# Patient Record
Sex: Female | Born: 1940 | Race: White | Hispanic: No | Marital: Married | Smoking: Former smoker
Health system: Southern US, Community
[De-identification: ages and names within clinical notes are randomized; demographics above are authoritative.]

## PROBLEM LIST (undated history)

## (undated) DIAGNOSIS — I10 Essential (primary) hypertension: Secondary | ICD-10-CM

## (undated) DIAGNOSIS — E7800 Pure hypercholesterolemia, unspecified: Secondary | ICD-10-CM

## (undated) DIAGNOSIS — IMO0002 Reserved for concepts with insufficient information to code with codable children: Secondary | ICD-10-CM

## (undated) DIAGNOSIS — M8580 Other specified disorders of bone density and structure, unspecified site: Secondary | ICD-10-CM

## (undated) DIAGNOSIS — J302 Other seasonal allergic rhinitis: Secondary | ICD-10-CM

## (undated) DIAGNOSIS — M1990 Unspecified osteoarthritis, unspecified site: Secondary | ICD-10-CM

## (undated) DIAGNOSIS — C4370 Malignant melanoma of unspecified lower limb, including hip: Secondary | ICD-10-CM

## (undated) DIAGNOSIS — C50919 Malignant neoplasm of unspecified site of unspecified female breast: Secondary | ICD-10-CM

## (undated) DIAGNOSIS — N189 Chronic kidney disease, unspecified: Secondary | ICD-10-CM

## (undated) HISTORY — DX: Other specified disorders of bone density and structure, unspecified site: M85.80

## (undated) HISTORY — DX: Chronic kidney disease, unspecified: N18.9

## (undated) HISTORY — PX: COLONOSCOPY: SHX174

## (undated) HISTORY — DX: Unspecified osteoarthritis, unspecified site: M19.90

## (undated) HISTORY — DX: Essential (primary) hypertension: I10

## (undated) HISTORY — DX: Malignant neoplasm of unspecified site of unspecified female breast: C50.919

## (undated) HISTORY — PX: MOHS SURGERY: SUR867

## (undated) HISTORY — PX: JOINT REPLACEMENT: SHX530

---
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
## 1966-01-21 HISTORY — PX: DILATION AND CURETTAGE OF UTERUS: SHX78

## 1997-06-06 ENCOUNTER — Other Ambulatory Visit: Admission: RE | Admit: 1997-06-06 | Discharge: 1997-06-06 | Payer: Self-pay | Admitting: Obstetrics and Gynecology

## 1997-07-12 ENCOUNTER — Ambulatory Visit (HOSPITAL_COMMUNITY): Admission: RE | Admit: 1997-07-12 | Discharge: 1997-07-12 | Payer: Self-pay | Admitting: Obstetrics & Gynecology

## 1998-06-06 ENCOUNTER — Other Ambulatory Visit: Admission: RE | Admit: 1998-06-06 | Discharge: 1998-06-06 | Payer: Self-pay | Admitting: Obstetrics and Gynecology

## 1998-06-08 ENCOUNTER — Ambulatory Visit (HOSPITAL_COMMUNITY): Admission: RE | Admit: 1998-06-08 | Discharge: 1998-06-08 | Payer: Self-pay | Admitting: Obstetrics and Gynecology

## 1998-06-08 ENCOUNTER — Encounter: Payer: Self-pay | Admitting: Obstetrics and Gynecology

## 1998-07-21 ENCOUNTER — Ambulatory Visit (HOSPITAL_COMMUNITY): Admission: RE | Admit: 1998-07-21 | Discharge: 1998-07-21 | Payer: Self-pay | Admitting: Obstetrics and Gynecology

## 1999-06-26 ENCOUNTER — Encounter: Payer: Self-pay | Admitting: Obstetrics and Gynecology

## 1999-06-26 ENCOUNTER — Ambulatory Visit (HOSPITAL_COMMUNITY): Admission: RE | Admit: 1999-06-26 | Discharge: 1999-06-26 | Payer: Self-pay | Admitting: Obstetrics and Gynecology

## 1999-06-26 ENCOUNTER — Other Ambulatory Visit: Admission: RE | Admit: 1999-06-26 | Discharge: 1999-06-26 | Payer: Self-pay | Admitting: Obstetrics and Gynecology

## 2000-08-18 ENCOUNTER — Other Ambulatory Visit: Admission: RE | Admit: 2000-08-18 | Discharge: 2000-08-18 | Payer: Self-pay | Admitting: Obstetrics and Gynecology

## 2000-09-01 ENCOUNTER — Encounter: Payer: Self-pay | Admitting: Obstetrics and Gynecology

## 2000-09-01 ENCOUNTER — Ambulatory Visit (HOSPITAL_COMMUNITY): Admission: RE | Admit: 2000-09-01 | Discharge: 2000-09-01 | Payer: Self-pay | Admitting: Obstetrics and Gynecology

## 2000-10-30 ENCOUNTER — Ambulatory Visit (HOSPITAL_COMMUNITY): Admission: RE | Admit: 2000-10-30 | Discharge: 2000-10-30 | Payer: Self-pay | Admitting: Internal Medicine

## 2001-09-03 ENCOUNTER — Ambulatory Visit (HOSPITAL_COMMUNITY): Admission: RE | Admit: 2001-09-03 | Discharge: 2001-09-03 | Payer: Self-pay | Admitting: Obstetrics and Gynecology

## 2001-09-03 ENCOUNTER — Encounter: Payer: Self-pay | Admitting: Obstetrics and Gynecology

## 2001-09-17 ENCOUNTER — Other Ambulatory Visit: Admission: RE | Admit: 2001-09-17 | Discharge: 2001-09-17 | Payer: Self-pay | Admitting: Obstetrics and Gynecology

## 2002-10-07 ENCOUNTER — Other Ambulatory Visit: Admission: RE | Admit: 2002-10-07 | Discharge: 2002-10-07 | Payer: Self-pay | Admitting: Obstetrics and Gynecology

## 2002-10-11 ENCOUNTER — Encounter: Payer: Self-pay | Admitting: Obstetrics and Gynecology

## 2002-10-11 ENCOUNTER — Ambulatory Visit (HOSPITAL_COMMUNITY): Admission: RE | Admit: 2002-10-11 | Discharge: 2002-10-11 | Payer: Self-pay | Admitting: Obstetrics and Gynecology

## 2003-05-22 DIAGNOSIS — C4370 Malignant melanoma of unspecified lower limb, including hip: Secondary | ICD-10-CM

## 2003-05-22 HISTORY — DX: Malignant melanoma of unspecified lower limb, including hip: C43.70

## 2003-05-22 HISTORY — PX: MELANOMA EXCISION: SHX5266

## 2003-06-03 ENCOUNTER — Ambulatory Visit (HOSPITAL_COMMUNITY): Admission: RE | Admit: 2003-06-03 | Discharge: 2003-06-03 | Payer: Self-pay | Admitting: Hematology and Oncology

## 2003-06-07 ENCOUNTER — Ambulatory Visit (HOSPITAL_COMMUNITY): Admission: RE | Admit: 2003-06-07 | Discharge: 2003-06-07 | Payer: Self-pay | Admitting: General Surgery

## 2003-06-07 ENCOUNTER — Encounter (INDEPENDENT_AMBULATORY_CARE_PROVIDER_SITE_OTHER): Payer: Self-pay | Admitting: *Deleted

## 2003-06-07 ENCOUNTER — Ambulatory Visit (HOSPITAL_BASED_OUTPATIENT_CLINIC_OR_DEPARTMENT_OTHER): Admission: RE | Admit: 2003-06-07 | Discharge: 2003-06-07 | Payer: Self-pay | Admitting: General Surgery

## 2003-10-10 ENCOUNTER — Other Ambulatory Visit: Admission: RE | Admit: 2003-10-10 | Discharge: 2003-10-10 | Payer: Self-pay | Admitting: Obstetrics and Gynecology

## 2003-10-12 ENCOUNTER — Ambulatory Visit (HOSPITAL_COMMUNITY): Admission: RE | Admit: 2003-10-12 | Discharge: 2003-10-12 | Payer: Self-pay | Admitting: Obstetrics and Gynecology

## 2003-11-15 ENCOUNTER — Ambulatory Visit (HOSPITAL_BASED_OUTPATIENT_CLINIC_OR_DEPARTMENT_OTHER): Admission: RE | Admit: 2003-11-15 | Discharge: 2003-11-15 | Payer: Self-pay | Admitting: General Surgery

## 2003-11-15 ENCOUNTER — Ambulatory Visit (HOSPITAL_COMMUNITY): Admission: RE | Admit: 2003-11-15 | Discharge: 2003-11-15 | Payer: Self-pay | Admitting: General Surgery

## 2003-11-15 ENCOUNTER — Encounter (INDEPENDENT_AMBULATORY_CARE_PROVIDER_SITE_OTHER): Payer: Self-pay | Admitting: *Deleted

## 2004-10-11 ENCOUNTER — Other Ambulatory Visit: Admission: RE | Admit: 2004-10-11 | Discharge: 2004-10-11 | Payer: Self-pay | Admitting: Obstetrics and Gynecology

## 2004-10-16 ENCOUNTER — Ambulatory Visit (HOSPITAL_COMMUNITY): Admission: RE | Admit: 2004-10-16 | Discharge: 2004-10-16 | Payer: Self-pay | Admitting: Obstetrics and Gynecology

## 2004-12-06 ENCOUNTER — Encounter: Admission: RE | Admit: 2004-12-06 | Discharge: 2004-12-06 | Payer: Self-pay | Admitting: Gynecology

## 2004-12-20 ENCOUNTER — Encounter: Admission: RE | Admit: 2004-12-20 | Discharge: 2004-12-20 | Payer: Self-pay | Admitting: Dermatology

## 2004-12-21 ENCOUNTER — Ambulatory Visit (HOSPITAL_COMMUNITY): Admission: RE | Admit: 2004-12-21 | Discharge: 2004-12-21 | Payer: Self-pay | Admitting: Dermatology

## 2005-05-21 ENCOUNTER — Encounter: Admission: RE | Admit: 2005-05-21 | Discharge: 2005-05-21 | Payer: Self-pay | Admitting: Dermatology

## 2005-05-23 ENCOUNTER — Encounter: Admission: RE | Admit: 2005-05-23 | Discharge: 2005-05-23 | Payer: Self-pay | Admitting: Dermatology

## 2005-10-15 ENCOUNTER — Other Ambulatory Visit: Admission: RE | Admit: 2005-10-15 | Discharge: 2005-10-15 | Payer: Self-pay | Admitting: Obstetrics & Gynecology

## 2005-10-23 ENCOUNTER — Ambulatory Visit (HOSPITAL_COMMUNITY): Admission: RE | Admit: 2005-10-23 | Discharge: 2005-10-23 | Payer: Self-pay | Admitting: Obstetrics and Gynecology

## 2006-01-21 HISTORY — PX: SQUAMOUS CELL CARCINOMA EXCISION: SHX2433

## 2006-10-29 ENCOUNTER — Other Ambulatory Visit: Admission: RE | Admit: 2006-10-29 | Discharge: 2006-10-29 | Payer: Self-pay | Admitting: Obstetrics and Gynecology

## 2006-10-31 ENCOUNTER — Ambulatory Visit (HOSPITAL_COMMUNITY): Admission: RE | Admit: 2006-10-31 | Discharge: 2006-10-31 | Payer: Self-pay | Admitting: Obstetrics and Gynecology

## 2007-10-29 ENCOUNTER — Other Ambulatory Visit: Admission: RE | Admit: 2007-10-29 | Discharge: 2007-10-29 | Payer: Self-pay | Admitting: Obstetrics and Gynecology

## 2007-11-02 ENCOUNTER — Ambulatory Visit (HOSPITAL_COMMUNITY): Admission: RE | Admit: 2007-11-02 | Discharge: 2007-11-02 | Payer: Self-pay | Admitting: Obstetrics and Gynecology

## 2007-11-13 ENCOUNTER — Encounter: Admission: RE | Admit: 2007-11-13 | Discharge: 2007-11-13 | Payer: Self-pay | Admitting: Internal Medicine

## 2008-09-06 DIAGNOSIS — Z85820 Personal history of malignant melanoma of skin: Secondary | ICD-10-CM | POA: Insufficient documentation

## 2008-09-06 DIAGNOSIS — M479 Spondylosis, unspecified: Secondary | ICD-10-CM | POA: Insufficient documentation

## 2008-09-06 DIAGNOSIS — E559 Vitamin D deficiency, unspecified: Secondary | ICD-10-CM | POA: Insufficient documentation

## 2008-11-03 ENCOUNTER — Ambulatory Visit (HOSPITAL_COMMUNITY): Admission: RE | Admit: 2008-11-03 | Discharge: 2008-11-03 | Payer: Self-pay | Admitting: Obstetrics and Gynecology

## 2008-11-15 ENCOUNTER — Encounter: Admission: RE | Admit: 2008-11-15 | Discharge: 2008-11-15 | Payer: Self-pay | Admitting: Obstetrics and Gynecology

## 2009-06-07 DIAGNOSIS — H8110 Benign paroxysmal vertigo, unspecified ear: Secondary | ICD-10-CM | POA: Insufficient documentation

## 2010-02-09 ENCOUNTER — Other Ambulatory Visit: Payer: Self-pay | Admitting: Obstetrics and Gynecology

## 2010-02-09 DIAGNOSIS — Z1231 Encounter for screening mammogram for malignant neoplasm of breast: Secondary | ICD-10-CM

## 2010-02-09 DIAGNOSIS — Z0000 Encounter for general adult medical examination without abnormal findings: Secondary | ICD-10-CM

## 2010-02-11 ENCOUNTER — Encounter: Payer: Self-pay | Admitting: Obstetrics and Gynecology

## 2010-03-26 ENCOUNTER — Ambulatory Visit (HOSPITAL_COMMUNITY)
Admission: RE | Admit: 2010-03-26 | Discharge: 2010-03-26 | Disposition: A | Discharge status: Home / Self Care | Payer: Medicare Other | Source: Ambulatory Visit | Attending: Obstetrics and Gynecology | Admitting: Obstetrics and Gynecology

## 2010-03-26 DIAGNOSIS — Z1231 Encounter for screening mammogram for malignant neoplasm of breast: Secondary | ICD-10-CM | POA: Insufficient documentation

## 2010-06-08 NOTE — Op Note (Signed)
NAMERivka, Ashley Cooke                   ACCOUNT NO.:  [id number]   MEDICAL RECORD NO.:  [id number]          PATIENT TYPE:  AMB   LOCATION:  DSC                          FACILITY:  [facility]   PHYSICIAN:  Rose Phi. Maple Hudson, M.D.   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   OPERATION PERFORMED:  Re-excision of melanoma site.   SURGEON:  Rose Phi. Maple Hudson, M.D.   ANESTHESIA:  MAC.   DESCRIPTION OF PROCEDURE:  The patient was placed on the operating table and  the left pretibial area prepped and draped in the usual sterile fashion.  The previous re-excision site was nicely healed and I outlined an excision  with a 0.5 cm margin on either side of the previous excision.  We then  thoroughly infiltrated the area with 1% Xylocaine.  We then excised the old  scar leaving a defect of  6.5 x 2.5 cm.  This was then closed in two layers  with interrupted 3-0 Vicryl and multiple 4-0 nylon sutures.  Dressing  applied.  The patient was then transferred to the recovery room in  satisfactory condition having tolerated the procedure well.      [name]   PRY/MEDQ  D:  11/15/2003  T:  11/15/2003  Job:  [id number]

## 2010-06-08 NOTE — Op Note (Signed)
NAME:  Troeger, Kennisha I                             ACCOUNT NO.:  [id number]   MEDICAL RECORD NO.:  [id number]                   PATIENT TYPE:  AMB   LOCATION:  DSC                                  FACILITY:  [facility]   PHYSICIAN:  Rose Phi. Maple Hudson, M.D.                DATE OF BIRTH:  July 06, 1940   DATE OF PROCEDURE:  06/07/2003  DATE OF DISCHARGE:                                 OPERATIVE REPORT   PREOPERATIVE DIAGNOSIS:  Melanoma left pretibial area.   POSTOPERATIVE DIAGNOSIS:  Melanoma left pretibial area.   OPERATION:  1. Blue dye injection.  2. Left inguinal sentinel lymph node biopsy.  3. Wide excision of melanoma of the left pretibial area.   SURGEON:  Rose Phi. Maple Hudson, M.D.   ANESTHESIA:  General.   OPERATIVE PROCEDURE:  This 70 year old female had presented with a lesion on  the left pretibial area that Dr. Nicholas Lose had biopsied which showed a 1.1 mm  thick melanoma and was then referred for further evaluation.  Preoperative  lymphoscintogram showed drainage basin to be the left inguinal area.   After suitable general anesthesia was induced, the patient was placed in a  supine position with the left leg prepped and draped in a standard fashion.  I injected about 0.5 mL of Lymphazurin blue intradermally around the  melanoma in the left pretibial area and gently massaged it for two minutes.  After prepping and draping, we scanned the left axilla and there was one hot  spot.  A vertical incision was made directly over that and we dissected out  a very hot but not blue lymph node.  There were no other palpable blue or  hot nodes.  This was excised as a left inguinal sentinel node and the  incision was closed with 3-0 Vicryl, subcuticular 4-0 Monocryl, and Steri-  Strips.   I then turned my attention to the left pretibial area where we outlined  about a 0.75 cm margin around this melanoma in this area.  It was then  excised down to the fascia.  Hemostasis was obtained with the cautery.   This  gave a 5 by 2.5 cm defect.  I was able to close it primarily with  interrupted nylon sutures.  Dressings were then applied and the patient was  transferred to the recovery room in satisfactory condition having tolerated  the procedure well.                                               Rose Phi. Maple Hudson, M.D.    PRY/MEDQ  D:  06/07/2003  T:  06/07/2003  Job:  [id number]   cc:   Venancio Poisson, M.D.

## 2010-06-14 ENCOUNTER — Other Ambulatory Visit: Payer: Self-pay | Admitting: Dermatology

## 2010-11-28 ENCOUNTER — Other Ambulatory Visit: Payer: Self-pay | Admitting: Dermatology

## 2011-01-17 ENCOUNTER — Encounter: Payer: Self-pay | Admitting: Internal Medicine

## 2011-02-14 ENCOUNTER — Other Ambulatory Visit: Payer: Self-pay | Admitting: Obstetrics and Gynecology

## 2011-02-14 DIAGNOSIS — Z1231 Encounter for screening mammogram for malignant neoplasm of breast: Secondary | ICD-10-CM

## 2011-02-19 ENCOUNTER — Encounter: Payer: Self-pay | Admitting: Internal Medicine

## 2011-03-01 ENCOUNTER — Ambulatory Visit (AMBULATORY_SURGERY_CENTER): Payer: Medicare Other | Admitting: *Deleted

## 2011-03-01 VITALS — Ht 64.0 in | Wt 163.7 lb

## 2011-03-01 DIAGNOSIS — Z1211 Encounter for screening for malignant neoplasm of colon: Secondary | ICD-10-CM

## 2011-03-01 MED ORDER — PEG-KCL-NACL-NASULF-NA ASC-C 100 G PO SOLR: ORAL | Status: DC

## 2011-03-01 NOTE — Progress Notes (Signed)
Patient feels she is not going to be able to drink prep, unable to give osmoprep pills due to BP med. Patient is on. Offered to get order for nausea pill but patient did not want this at this time. She states she would try to drink it and call 24 # if unable.

## 2011-03-14 ENCOUNTER — Telehealth: Payer: Self-pay | Admitting: *Deleted

## 2011-03-14 NOTE — Telephone Encounter (Signed)
Called to see if patient could move to an earlier available appointment time.  Spoke with patients husband.  Patient and husband to arrive at 0930 for a 1030 appointment.

## 2011-03-15 ENCOUNTER — Ambulatory Visit (AMBULATORY_SURGERY_CENTER): Payer: Medicare Other | Admitting: Internal Medicine

## 2011-03-15 ENCOUNTER — Encounter: Payer: Self-pay | Admitting: Internal Medicine

## 2011-03-15 ENCOUNTER — Encounter: Payer: Medicare Other | Admitting: Internal Medicine

## 2011-03-15 VITALS — BP 149/69 | HR 68 | Temp 96.6°F | Resp 20 | Ht 64.0 in | Wt 163.0 lb

## 2011-03-15 DIAGNOSIS — Z1211 Encounter for screening for malignant neoplasm of colon: Secondary | ICD-10-CM

## 2011-03-15 MED ORDER — SODIUM CHLORIDE 0.9 % IV SOLN: 500.0000 mL | INTRAVENOUS | Status: DC

## 2011-03-15 NOTE — Progress Notes (Signed)
Patient did not experience any of the following events: a burn prior to discharge; a fall within the facility; wrong site/side/patient/procedure/implant event; or a hospital transfer or hospital admission upon discharge from the facility. (G8907) Patient did not have preoperative order for IV antibiotic SSI prophylaxis. (G8918)  

## 2011-03-15 NOTE — Op Note (Signed)
[facility] [address] [place], [place]  [address]  COLONOSCOPY PROCEDURE REPORT  PATIENT:  Ashley Cooke, Ashley Cooke  MR#:  [id number] BIRTHDATE:  [birth year], 70 yrs. old  GENDER:  female ENDOSCOPIST:  Hedwig Morton. Juanda Chance, MD REF. BY:  Guerry Bruin, M.D. PROCEDURE DATE:  03/15/2011 PROCEDURE:  Colonoscopy 25956 ASA CLASS:  Class II INDICATIONS:  colorectal cancer screening, average risk last colon 2002 was normal hx of melanoma MEDICATIONS:   MAC sedation, administered by CRNA, propofol (Diprivan) 200 mg  DESCRIPTION OF PROCEDURE:   After the risks and benefits and of the procedure were explained, informed consent was obtained. Digital rectal exam was performed and revealed no rectal masses. The [id number] endoscope was introduced through the anus and advanced to the cecum, which was identified by both the appendix and ileocecal valve.  The quality of the prep was good, using MoviPrep.  The instrument was then slowly withdrawn as the colon was fully examined. <<PROCEDUREIMAGES>>  FINDINGS:  Moderate diverticulosis was found (see image1, image3, image4, and image5). narrow tortuous sigmoid colon, difficult to traverse  Internal Hemorrhoids were found (see image6).  This was otherwise a normal examination of the colon (see image2). Retroflexed views in the rectum revealed no abnormalities.    The scope was then withdrawn from the patient and the procedure completed.  COMPLICATIONS:  None ENDOSCOPIC IMPRESSION: 1) Moderate diverticulosis 2) Internal hemorrhoids 3) Otherwise normal examination RECOMMENDATIONS: 1) High fiber diet. Metamucil 1 tsp po qd  REPEAT EXAM:  In 10 year(s) for.  ______________________________ Hedwig Morton. Juanda Chance, MD  CC:  n. eSIGNED:   Hedwig Morton. Richa Shor at 03/15/2011 11:16 AM  Emelda Fear, [phone]

## 2011-03-15 NOTE — Patient Instructions (Signed)

## 2011-03-18 ENCOUNTER — Telehealth: Payer: Self-pay | Admitting: *Deleted

## 2011-03-18 NOTE — Telephone Encounter (Signed)
  Follow up Call-  Call back number 03/15/2011  Post procedure Call Back phone  # [phone]  Permission to leave phone message Yes     Patient questions:  Do you have a fever, pain , or abdominal swelling? no Pain Score  0 *  Have you tolerated food without any problems? yes  Have you been able to return to your normal activities? yes  Do you have any questions about your discharge instructions: Diet   no Medications  no Follow up visit  no  Do you have questions or concerns about your Care? no  Actions: * If pain score is 4 or above: No action needed, pain <4   Pt. Denied any problems ,pain , or fever after procedure on Friday , but pt had a request that Dr. Juanda Chance let her know whether diverticulosis worse than last time she had a colonoscopy.

## 2011-03-18 NOTE — Telephone Encounter (Signed)
  Follow up Call-  Call back number 03/15/2011  Post procedure Call Back phone  # [phone]  Permission to leave phone message Yes     Patient questions:  Do you have a fever, pain , or abdominal swelling? no Pain Score  0 *  Have you tolerated food without any problems? yes  Have you been able to return to your normal activities? yes  Do you have any questions about your discharge instructions: Diet   no Medications  no Follow up visit  no  Do you have questions or concerns about your Care? no  Actions: * If pain score is 4 or above: No action needed, pain <4. Pt. Denied any problems ,pain or fever from procedure on Friday. Pt did have one request and that was she would like to know if her diverticulosis is worse than 10 years ago or the same.

## 2011-03-27 ENCOUNTER — Ambulatory Visit (HOSPITAL_COMMUNITY)
Admission: RE | Admit: 2011-03-27 | Discharge: 2011-03-27 | Disposition: A | Discharge status: Home / Self Care | Payer: Medicare Other | Source: Ambulatory Visit | Attending: Obstetrics and Gynecology | Admitting: Obstetrics and Gynecology

## 2011-03-27 DIAGNOSIS — Z1231 Encounter for screening mammogram for malignant neoplasm of breast: Secondary | ICD-10-CM

## 2012-04-15 ENCOUNTER — Encounter: Payer: Self-pay | Admitting: *Deleted

## 2012-04-22 DIAGNOSIS — K5730 Diverticulosis of large intestine without perforation or abscess without bleeding: Secondary | ICD-10-CM | POA: Insufficient documentation

## 2012-04-22 DIAGNOSIS — K649 Unspecified hemorrhoids: Secondary | ICD-10-CM | POA: Insufficient documentation

## 2012-04-22 DIAGNOSIS — N1831 Chronic kidney disease, stage 3a: Secondary | ICD-10-CM | POA: Insufficient documentation

## 2012-04-28 ENCOUNTER — Other Ambulatory Visit: Payer: Self-pay | Admitting: Orthopedic Surgery

## 2012-04-28 DIAGNOSIS — S83207D Unspecified tear of unspecified meniscus, current injury, left knee, subsequent encounter: Secondary | ICD-10-CM

## 2012-04-29 ENCOUNTER — Ambulatory Visit
Admission: RE | Admit: 2012-04-29 | Discharge: 2012-04-29 | Disposition: A | Discharge status: Home / Self Care | Payer: Medicare Other | Source: Ambulatory Visit | Attending: Orthopedic Surgery | Admitting: Orthopedic Surgery

## 2012-04-29 DIAGNOSIS — S83207D Unspecified tear of unspecified meniscus, current injury, left knee, subsequent encounter: Secondary | ICD-10-CM

## 2012-05-04 ENCOUNTER — Ambulatory Visit: Payer: Self-pay | Admitting: Nurse Practitioner

## 2012-05-04 ENCOUNTER — Encounter: Payer: Self-pay | Admitting: Nurse Practitioner

## 2012-05-04 DIAGNOSIS — Z01419 Encounter for gynecological examination (general) (routine) without abnormal findings: Secondary | ICD-10-CM

## 2012-05-06 HISTORY — PX: KNEE ARTHROSCOPY: SHX127

## 2012-05-12 ENCOUNTER — Other Ambulatory Visit: Payer: Self-pay | Admitting: Obstetrics and Gynecology

## 2012-05-12 DIAGNOSIS — Z1231 Encounter for screening mammogram for malignant neoplasm of breast: Secondary | ICD-10-CM

## 2012-05-13 ENCOUNTER — Ambulatory Visit (HOSPITAL_COMMUNITY)
Admission: RE | Admit: 2012-05-13 | Discharge: 2012-05-13 | Disposition: A | Discharge status: Home / Self Care | Payer: Medicare Other | Source: Ambulatory Visit | Attending: Obstetrics and Gynecology | Admitting: Obstetrics and Gynecology

## 2012-05-13 DIAGNOSIS — Z1231 Encounter for screening mammogram for malignant neoplasm of breast: Secondary | ICD-10-CM | POA: Insufficient documentation

## 2012-06-18 ENCOUNTER — Encounter: Payer: Self-pay | Admitting: Nurse Practitioner

## 2012-06-18 ENCOUNTER — Ambulatory Visit (INDEPENDENT_AMBULATORY_CARE_PROVIDER_SITE_OTHER): Payer: Medicare Other | Admitting: Nurse Practitioner

## 2012-06-18 VITALS — BP 108/60 | HR 68 | Ht 62.5 in | Wt 158.4 lb

## 2012-06-18 DIAGNOSIS — Z01419 Encounter for gynecological examination (general) (routine) without abnormal findings: Secondary | ICD-10-CM

## 2012-06-18 MED ORDER — MEDROXYPROGESTERONE ACETATE 2.5 MG PO TABS: 2.5000 mg | ORAL_TABLET | Freq: Every day | ORAL | Status: DC

## 2012-06-18 MED ORDER — ESTRADIOL 0.5 MG PO TABS: 0.5000 mg | ORAL_TABLET | Freq: Every day | ORAL | Status: DC

## 2012-06-18 NOTE — Patient Instructions (Addendum)

## 2012-06-18 NOTE — Progress Notes (Signed)
72 y.o. G94P4 Married Caucasian Fe here for annual exam.  Continues of HRT, now on Estradiol at every other day and provera daily. No new health concerns other than slight elevated cholesterol. She is considering a statin drug if not better.  No LMP recorded. Patient is postmenopausal.          Sexually active: yes  The current method of family planning is post menopausal status.    Exercising: yes  tennis, hiking and swimming  Smoker:  no  Health Maintenance: Pap:  03/29/2010  Normal  MMG:  04/2012 normal Colonoscopy:  2013 normal @ [place] BMD:   06/2010 normal TDaP:  10/2008 Shingles 2007 Labs: PCP does lab (blood) work. Recent vit D level was normal, some elevation of total cholesterol and LDL   reports that she quit smoking about 19 years ago. Her smoking use included Cigarettes. She smoked 0.50 packs per day. She has never used smokeless tobacco. She reports that she drinks about 4.2 ounces of alcohol per week. She reports that she does not use illicit drugs.  Past Medical History  Diagnosis Date  . Allergy   . Hypertension   . Arthritis     Past Surgical History  Procedure Laterality Date  . Tonsillectomy and adenoidectomy  age 63  . Dilation and curettage of uterus  age 89  . Melanoma excision  2005    leg    Current Outpatient Prescriptions  Medication Sig Dispense Refill  . aspirin 81 MG tablet Take 81 mg by mouth daily.      . calcium carbonate (CALCIUM 600) 600 MG TABS Take 600 mg by mouth daily.      . cholecalciferol (VITAMIN D) 1000 UNITS tablet Take 1,000 Units by mouth 2 (two) times daily.      Marland Kitchen ESTRADIOL PO Take 0.5 tablets by mouth daily.      Marland Kitchen estrogen, conjugated,-medroxyprogesterone (PREMPRO) 0.3-1.5 MG per tablet Take 1 tablet by mouth every other day.      . famotidine (PEPCID) 20 MG tablet Take 20 mg by mouth daily.      Marland Kitchen loratadine (CLARITIN) 10 MG tablet Take 10 mg by mouth daily as needed.      Marland Kitchen losartan (COZAAR) 100 MG tablet Take 50 mg by mouth  Daily.      . Multiple Vitamin (MULTIVITAMIN) tablet Take 1 tablet by mouth daily.      . naproxen sodium (ANAPROX) 220 MG tablet Take 220 mg by mouth 2 (two) times daily with a meal.      . Omega-3 Fatty Acids (FISH OIL) 1000 MG CAPS Take 1 capsule by mouth 2 (two) times daily.      . vitamin C (ASCORBIC ACID) 500 MG tablet Take 500 mg by mouth 2 (two) times daily.      Marland Kitchen VITAMIN E PO Take 800 mg by mouth daily.       No current facility-administered medications for this visit.    Family History  Problem Relation Age of Onset  . Colon cancer Neg Hx     ROS:  Pertinent items are noted in HPI.  Otherwise, a comprehensive ROS was negative.  Exam:   BP 108/60  Pulse 68  Ht 5' 2.5" (1.588 m)  Wt 158 lb 6.4 oz (71.85 kg)  BMI 28.49 kg/m2 Height: 5' 2.5" (158.8 cm)  Ht Readings from Last 3 Encounters:  06/18/12 5' 2.5" (1.588 m)  03/15/11 5\' 4"  (1.626 m)  03/01/11 5\' 4"  (1.626 m)  General appearance: alert, cooperative and appears stated age Head: Normocephalic, without obvious abnormality, atraumatic Neck: no adenopathy, supple, symmetrical, trachea midline and thyroid normal to inspection and palpation Lungs: clear to auscultation bilaterally Breasts: normal appearance, no masses or tenderness Heart: regular rate and rhythm Abdomen: soft, non-tender; no masses,  no organomegaly Extremities: extremities normal, atraumatic, no cyanosis or edema Skin: Skin color, texture, turgor normal. No rashes or lesions, multiple seborrheic keratosis Lymph nodes: Cervical, supraclavicular, and axillary nodes normal. No abnormal inguinal nodes palpated Neurologic: Grossly normal   Pelvic: External genitalia:  no lesions              Urethra:  normal appearing urethra with no masses, tenderness or lesions              Bartholin's and Skene's: normal                 Vagina: normal appearing vagina with normal color and discharge, no lesions              Cervix: anteverted              Pap  taken: no Bimanual Exam:  Uterus:  normal size, contour, position, consistency, mobility, non-tender              Adnexa: no mass, fullness, tenderness               Rectovaginal: Confirms               Anus:  normal sphincter tone, no lesions  A:  Well Woman with normal exam  Postmenopausal on HRT since about 1981  History of elevated cholesterol last month     P:   Pap smear as per guidelines   Mammogram due 4/15  Refill on Estradiol and Provera   Counseled on potential side effects and risks of HRT including DVT, CVA, cancer, etc  Patient is aware and wants to continue.  counseled on osteoporosis, adequate intake of calcium and vitamin D,   diet and exercise, Kegel's exercises return annually or prn  An After Visit Summary was printed and given to the patient.

## 2012-06-18 NOTE — Progress Notes (Signed)
Reviewed personally.  MSM 

## 2012-08-26 ENCOUNTER — Other Ambulatory Visit: Payer: Self-pay

## 2012-11-12 ENCOUNTER — Other Ambulatory Visit: Payer: Self-pay | Admitting: Dermatology

## 2012-11-26 ENCOUNTER — Other Ambulatory Visit: Payer: Self-pay

## 2013-05-17 ENCOUNTER — Other Ambulatory Visit: Payer: Self-pay | Admitting: Nurse Practitioner

## 2013-05-17 DIAGNOSIS — Z1231 Encounter for screening mammogram for malignant neoplasm of breast: Secondary | ICD-10-CM

## 2013-06-22 ENCOUNTER — Ambulatory Visit: Payer: Medicare Other | Admitting: Nurse Practitioner

## 2013-08-24 ENCOUNTER — Other Ambulatory Visit: Payer: Self-pay | Admitting: *Deleted

## 2013-08-24 NOTE — Telephone Encounter (Signed)
Left Message To Call Back  

## 2013-08-24 NOTE — Telephone Encounter (Addendum)
Fax from [facility] for Estradiol 0.5 mg Last refilled/AEX 06/18/12 #90/3 Refills (Estradiol) 06/18/12 #90/4 refills (Medroxyprogesterone) Last Mammogram: 05/13/12 Bi-Rads 1 AEX Scheduled: 10/08/13 with Ms. Patty  Please Advise.

## 2013-08-24 NOTE — Telephone Encounter (Signed)
Must get mammo scheduled and then can get 2 wks supply in the interim only.  She is way past due on Mammo.

## 2013-08-27 NOTE — Telephone Encounter (Signed)
Left Message To Call Back  

## 2013-08-30 NOTE — Telephone Encounter (Signed)
Tried to LM no answer/voice mail set up.

## 2013-09-01 NOTE — Telephone Encounter (Signed)
I have put no refill on the RX request from the pharmacy - so even if we can not get her -she will get message when she goes to the pharmacy.  Ok to close the note.

## 2013-09-01 NOTE — Telephone Encounter (Signed)
Have tried contacting this patient x3 please advise on next step?

## 2013-09-06 ENCOUNTER — Telehealth: Payer: Self-pay | Admitting: Nurse Practitioner

## 2013-09-06 NOTE — Telephone Encounter (Signed)
Pt says she is calling kaitlyn back.

## 2013-09-06 NOTE — Telephone Encounter (Signed)
Pt wants to talk to Patty no information given.

## 2013-09-06 NOTE — Telephone Encounter (Signed)
Spoke with patient. Patient states that she was in town last week and tried to refill her HRT but was denied. Pharmacy contacted our office for request. Patient would like to know why it was denies. Advised per notes patient needs to schedule mammogram as she is over due. Patient states she is out of town until [place]. "That is like three weeks away to go without any medicine at all." Advised patient per note Milford Cage, FNP would give her a refill in the interim for 2 weeks. Patient states that is not enough because she will not be in town. Advised patient to schedule mammogram and call back and I will send a message over to Milford Cage, [place] regarding refill request. Patient agreeable.  Milford Cage, FNP if patient schedules mammogram okay to refill until she is seen for mammogram?

## 2013-09-06 NOTE — Telephone Encounter (Signed)
Yes only if she schedules and we have the date noted. thanks

## 2013-09-06 NOTE — Telephone Encounter (Signed)
Attempted to reach patient at number provided. Got to voicemail and then message cut off before leaving a message. Will try to reach patient again.

## 2013-09-07 MED ORDER — ESTRADIOL 0.5 MG PO TABS: 0.5000 mg | ORAL_TABLET | Freq: Every day | ORAL | Status: DC

## 2013-09-07 NOTE — Telephone Encounter (Addendum)
Prescription failed to e-prescribe has to be called in. [place] and called in  Estradiol 0.5 mg 1 po daily #30/0 refills.

## 2013-09-07 NOTE — Telephone Encounter (Signed)
Spoke with patient. Patient states that she scheduled an appointment for a mammogram at [facility] for Sept 10th. Patient also has aex with Milford Cage, FNP on 9/18. Patient is going out of town to [place] today and would [facility] [place]. Advised would send over refills per Milford Cage, FNP approval [facility] the [facility] until she can be seen for mammogram. Patient agreeable. Rx for estradiol 0.5mg  #30 0RF [facility].   Routing to provider for final review. Patient agreeable to disposition. Will close encounter

## 2013-09-09 ENCOUNTER — Telehealth: Payer: Self-pay | Admitting: Nurse Practitioner

## 2013-09-09 MED ORDER — MEDROXYPROGESTERONE ACETATE 2.5 MG PO TABS: 2.5000 mg | ORAL_TABLET | Freq: Every day | ORAL | Status: DC

## 2013-09-09 NOTE — Telephone Encounter (Signed)
Patient is asking to talk with [facility]. Patient says this is an ongoing conversation.

## 2013-09-09 NOTE — Telephone Encounter (Signed)
Pt calling kaitlyn again

## 2013-09-09 NOTE — Telephone Encounter (Signed)
Patient calling to states that when we sent in her refill for her estradiol we did not send in the provera. States that she takes these together and that she is also out of this rx. Will need this rx as well until she can be seen for her mammogram. [facility] [place]. Provera 2.5mg  tablet #30 0RF sent to pharmacy of choice. See previous telephone encounter with approval from Milford Cage, [place] to refill until seen for mammogram.  Routing to provider for final review. Patient agreeable to disposition. Will close encounter

## 2013-09-09 NOTE — Telephone Encounter (Signed)
Left message to call Minnetta Sandora at [phone]. 

## 2013-09-30 ENCOUNTER — Ambulatory Visit (HOSPITAL_COMMUNITY)
Admission: RE | Admit: 2013-09-30 | Discharge: 2013-09-30 | Disposition: A | Discharge status: Home / Self Care | Payer: Medicare Other | Source: Ambulatory Visit | Attending: Nurse Practitioner | Admitting: Nurse Practitioner

## 2013-09-30 DIAGNOSIS — Z1231 Encounter for screening mammogram for malignant neoplasm of breast: Secondary | ICD-10-CM | POA: Diagnosis not present

## 2013-10-08 ENCOUNTER — Ambulatory Visit (INDEPENDENT_AMBULATORY_CARE_PROVIDER_SITE_OTHER): Payer: Medicare Other | Admitting: Nurse Practitioner

## 2013-10-08 ENCOUNTER — Encounter: Payer: Self-pay | Admitting: Nurse Practitioner

## 2013-10-08 VITALS — BP 120/74 | HR 72 | Ht 63.5 in | Wt 168.0 lb

## 2013-10-08 DIAGNOSIS — E7800 Pure hypercholesterolemia, unspecified: Secondary | ICD-10-CM

## 2013-10-08 DIAGNOSIS — M129 Arthropathy, unspecified: Secondary | ICD-10-CM

## 2013-10-08 DIAGNOSIS — Z01419 Encounter for gynecological examination (general) (routine) without abnormal findings: Secondary | ICD-10-CM

## 2013-10-08 DIAGNOSIS — Z79890 Hormone replacement therapy (postmenopausal): Secondary | ICD-10-CM

## 2013-10-08 DIAGNOSIS — I10 Essential (primary) hypertension: Secondary | ICD-10-CM

## 2013-10-08 DIAGNOSIS — M1990 Unspecified osteoarthritis, unspecified site: Secondary | ICD-10-CM

## 2013-10-08 MED ORDER — MEDROXYPROGESTERONE ACETATE 2.5 MG PO TABS: 1.2500 mg | ORAL_TABLET | ORAL | Status: DC

## 2013-10-08 MED ORDER — ESTRADIOL 0.5 MG PO TABS: 0.2500 mg | ORAL_TABLET | ORAL | Status: DC

## 2013-10-08 NOTE — Patient Instructions (Signed)

## 2013-10-08 NOTE — Progress Notes (Signed)
Patient ID: Ashley Cooke, female   DOB: Jul 29, 1940, 73 y.o.   MRN: [id number] 73 y.o. G30P4 Married Caucasian Fe here for annual exam.  Takes 1/2 dose of Estradiol and 1/2 dose of Provera every other day. This past year some worsening  of arthritis of both knees.  May need total knee replacement.  She did have several comments to make about recent need to get a refill on HRT and had to get Mammo scheduled first.  I just reinforced that was our policy.  She also is not ready to come off HRT yet but is decreasing her dosing.  She has had bad vaso symptoms in the past at decreasing  Patient's last menstrual period was 01/21/1977.          Sexually active: yes  The current method of family planning is post menopausal status.  Exercising: yes tennis, hiking and swimming  Smoker: no   Health Maintenance:  Pap: 03/29/2010 Normal  MMG: 09/30/13, Bi-Rads 1:  Negative  Colonoscopy: 03/15/11, normal, repeat in 10 years BMD: 06/2010 normal  TDaP: 10/2008  Shingles 2007  Labs: PCP does lab (blood) work.    reports that she quit smoking about 40 years ago. Her smoking use included Cigarettes. She has a 9 pack-year smoking history. She has never used smokeless tobacco. She reports that she drinks about 4.2 ounces of alcohol per week. She reports that she does not use illicit drugs.  Past Medical History  Diagnosis Date  . Allergy     seaonal  . Hypertension   . Arthritis     Past Surgical History  Procedure Laterality Date  . Tonsillectomy and adenoidectomy  age 75  . Dilation and curettage of uterus  age 44  . 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 Knee arthroscopy Left 05/06/12    torn miniscus    Current Outpatient Prescriptions  Medication Sig Dispense Refill  . calcium carbonate (CALCIUM 600) 600 MG TABS Take 600 mg by mouth daily.      . cholecalciferol (VITAMIN D) 1000 UNITS tablet Take 1,000 Units by mouth daily.       Marland Kitchen estradiol (ESTRACE) 0.5 MG  tablet Take 0.5 tablets (0.25 mg total) by mouth every other day.  90 tablet  3  . famotidine (PEPCID) 20 MG tablet Take 20 mg by mouth 2 (two) times daily.       Marland Kitchen loratadine (CLARITIN) 10 MG tablet Take 10 mg by mouth daily as needed.      Marland Kitchen losartan (COZAAR) 100 MG tablet Take 50 mg by mouth Daily.      . medroxyPROGESTERone (PROVERA) 2.5 MG tablet Take 0.5 tablets (1.25 mg total) by mouth every other day.  90 tablet  3  . Multiple Vitamin (MULTIVITAMIN) tablet Take 1 tablet by mouth daily.      . naproxen sodium (ANAPROX) 220 MG tablet Take 440 mg by mouth 2 (two) times daily with a meal.       . vitamin C (ASCORBIC ACID) 500 MG tablet Take 500 mg by mouth 2 (two) times daily.      Marland Kitchen VITAMIN E PO Take 800 mg by mouth daily.      Marland Kitchen atorvastatin (LIPITOR) 10 MG tablet Take 1 tablet by mouth daily.       No current facility-administered medications for this visit.    Family History  Problem Relation Age of Onset  .  Colon cancer Neg Hx     ROS:  Pertinent items are noted in HPI.  Otherwise, a comprehensive ROS was negative.  Exam:   BP 120/74  Pulse 72  Ht 5' 3.5" (1.613 m)  Wt 168 lb (76.204 kg)  BMI 29.29 kg/m2  LMP 01/21/1977 Height: 5' 3.5" (161.3 cm) (with shoes)  Ht Readings from Last 3 Encounters:  10/08/13 5' 3.5" (1.613 m)  06/18/12 5' 2.5" (1.588 m)  03/15/11 5\' 4"  (1.626 m)    General appearance: alert, cooperative and appears stated age Head: Normocephalic, without obvious abnormality, atraumatic Neck: no adenopathy, supple, symmetrical, trachea midline and thyroid normal to inspection and palpation Lungs: clear to auscultation bilaterally Breasts: normal appearance, no masses or tenderness Heart: regular rate and rhythm Abdomen: soft, non-tender; no masses,  no organomegaly Extremities: extremities normal, atraumatic, no cyanosis or edema Skin: Skin color, texture, turgor normal. No rashes or lesions Lymph nodes: Cervical, supraclavicular, and axillary nodes  normal. No abnormal inguinal nodes palpated Neurologic: Grossly normal   Pelvic: External genitalia:  no lesions              Urethra:  normal appearing urethra with no masses, tenderness or lesions              Bartholin's and Skene's: normal                 Vagina: normal appearing vagina with normal color and discharge, no lesions              Cervix: anteverted              Pap taken: No. Bimanual Exam:  Uterus:  normal size, contour, position, consistency, mobility, non-tender              Adnexa: no mass, fullness, tenderness               Rectovaginal: Confirms               Anus:  normal sphincter tone, no lesions  A:  Well Woman with normal exam  Postmenopausal on HRT since about 1981   History of elevated cholesterol   HTN, OA  P:   Reviewed health and wellness pertinent to exam  Pap smear not taken today  Mammogram is due 9/16  Refill Estrace 0.5 mg for a year  Refill Provera 2.5 mg for a year  Counseled on potential risk of DVT, CVA, cancer, etc.  Counseled on breast self exam, mammography screening, use and side effects of HRT, adequate intake of calcium and vitamin D, diet and exercise, Kegel's exercises return annually or prn  She is aware that she must come off HRT if any surgery  An After Visit Summary was printed and given to the patient.

## 2013-10-10 ENCOUNTER — Encounter: Payer: Self-pay | Admitting: Nurse Practitioner

## 2013-10-10 DIAGNOSIS — I10 Essential (primary) hypertension: Secondary | ICD-10-CM | POA: Insufficient documentation

## 2013-10-10 DIAGNOSIS — E7800 Pure hypercholesterolemia, unspecified: Secondary | ICD-10-CM | POA: Insufficient documentation

## 2013-10-10 DIAGNOSIS — M1990 Unspecified osteoarthritis, unspecified site: Secondary | ICD-10-CM | POA: Insufficient documentation

## 2013-10-10 DIAGNOSIS — Z79890 Hormone replacement therapy (postmenopausal): Secondary | ICD-10-CM | POA: Insufficient documentation

## 2013-10-10 NOTE — Progress Notes (Signed)
Encounter reviewed by Dr. Brook Silva.  

## 2013-10-12 ENCOUNTER — Telehealth: Payer: Self-pay | Admitting: Nurse Practitioner

## 2013-10-12 NOTE — Telephone Encounter (Signed)
Patient calling requesting to speak with nurse about estradiol and progesterone questions.

## 2013-10-13 NOTE — Telephone Encounter (Signed)
Spoke with patient. Patient states that when she went to pick up rx for estradiol and progesterone there were only 8 pills in each bottle. Patient states that she thought Milford Cage, FNP was going to write the rx's for a 90 day supply. Advised patient per our record rx was sent for 90 day supply. Patient states "I am not sure how she wanted to write it. I am not in town that often so it will be hard for me to pick up 8 pills every time." Advised patient will speak with pharmacy to fix dispense problem. Patient states "I really want you to check with Patty to see how she wanted it ordered first." Advised patient would send a message to Milford Cage, FNP to verify before speaking with the pharmacy and give her a call back. Patient agreeable.

## 2013-10-17 NOTE — Telephone Encounter (Signed)
I opened up her chart to review what I had put on the med's I did give her #90 with 3 refills on each.  Not sure why they only gave her #8.

## 2013-10-18 MED ORDER — ESTRADIOL 0.5 MG PO TABS: 0.5000 mg | ORAL_TABLET | Freq: Every day | ORAL | Status: DC

## 2013-10-18 MED ORDER — MEDROXYPROGESTERONE ACETATE 2.5 MG PO TABS: ORAL_TABLET | ORAL | Status: DC

## 2013-10-18 NOTE — Telephone Encounter (Signed)
Spoke with Meagan at [facility] who states that patient's insurance will only cover 1 month at a time. With patient taking both prescriptions every other day at half a tablet she only needs 8 pills per month. Spoke with patient. Advised of message from [place] at [facility]. Patient is agreeable but states "The problem is the way the instructions are. If it were written to take one tablet daily that would allow me to have 30 tablets which would cover me for three months. Then I still take it every other day." Advised patient would send a message over to Milford Cage, [facility] regarding this and give her a call back with further recommendations and instructions. Patient is agreeable.

## 2013-10-18 NOTE — Telephone Encounter (Signed)
See new orders and I was able to fix the Rx

## 2013-10-18 NOTE — Addendum Note (Signed)
Addended by: Antonietta Barcelona on: 10/18/2013 06:10 PM   Modules accepted: Orders

## 2013-10-19 NOTE — Telephone Encounter (Signed)
Spoke with patient. Advised of message as seen below from Ashley Cooke, [place]. Patient agreeable and verbalizes understanding.  Routing to provider for final review. Patient agreeable to disposition. Will close encounter

## 2013-11-22 ENCOUNTER — Encounter: Payer: Self-pay | Admitting: Nurse Practitioner

## 2013-12-21 ENCOUNTER — Telehealth: Payer: Self-pay | Admitting: Nurse Practitioner

## 2013-12-21 NOTE — Telephone Encounter (Signed)
Patient was called to discuss Medicare coverage of Estradiol.  Starting Jan. 1, 2016 it is no longer covered even with previous prior authorization.  Other options as mentioned on form includes: Premarin vaginal cream, Fosamax, Celexa, Prozac, and Gabapentin.  None of these are good options for her as they have either GI or CNS side effects.  This is also not used for bone density loss.  She will continue to take Estradiol if not cost prohibitive but also may decide to taper off med's as we have encouraged her to do in the past.

## 2014-07-18 ENCOUNTER — Other Ambulatory Visit: Payer: Self-pay

## 2014-07-27 ENCOUNTER — Other Ambulatory Visit: Payer: Self-pay | Admitting: Nurse Practitioner

## 2014-07-27 DIAGNOSIS — Z1231 Encounter for screening mammogram for malignant neoplasm of breast: Secondary | ICD-10-CM

## 2014-09-19 ENCOUNTER — Ambulatory Visit: Payer: Self-pay | Admitting: Physician Assistant

## 2014-10-03 ENCOUNTER — Encounter (HOSPITAL_COMMUNITY): Payer: Self-pay

## 2014-10-03 ENCOUNTER — Ambulatory Visit (HOSPITAL_COMMUNITY)
Admission: RE | Admit: 2014-10-03 | Discharge: 2014-10-03 | Disposition: A | Discharge status: Home / Self Care | Payer: Medicare Other | Source: Ambulatory Visit | Attending: Physician Assistant | Admitting: Physician Assistant

## 2014-10-03 ENCOUNTER — Encounter (HOSPITAL_COMMUNITY)
Admission: RE | Admit: 2014-10-03 | Discharge: 2014-10-03 | Disposition: A | Discharge status: Home / Self Care | Payer: Medicare Other | Source: Ambulatory Visit | Attending: Orthopedic Surgery | Admitting: Orthopedic Surgery

## 2014-10-03 DIAGNOSIS — M179 Osteoarthritis of knee, unspecified: Secondary | ICD-10-CM | POA: Insufficient documentation

## 2014-10-03 DIAGNOSIS — M1712 Unilateral primary osteoarthritis, left knee: Secondary | ICD-10-CM

## 2014-10-03 DIAGNOSIS — Z87891 Personal history of nicotine dependence: Secondary | ICD-10-CM | POA: Insufficient documentation

## 2014-10-03 LAB — CBC WITH DIFFERENTIAL/PLATELET
BASOS ABS: 0 10*3/uL (ref 0.0–0.1)
Basophils Relative: 0 % (ref 0–1)
EOS ABS: 0.3 10*3/uL (ref 0.0–0.7)
EOS PCT: 4 % (ref 0–5)
HCT: 41.7 % (ref 36.0–46.0)
Hemoglobin: 14.2 g/dL (ref 12.0–15.0)
LYMPHS ABS: 2.3 10*3/uL (ref 0.7–4.0)
Lymphocytes Relative: 27 % (ref 12–46)
MCH: 31.4 pg (ref 26.0–34.0)
MCHC: 34.1 g/dL (ref 30.0–36.0)
MCV: 92.3 fL (ref 78.0–100.0)
Monocytes Absolute: 0.5 10*3/uL (ref 0.1–1.0)
Monocytes Relative: 6 % (ref 3–12)
Neutro Abs: 5.5 10*3/uL (ref 1.7–7.7)
Neutrophils Relative %: 63 % (ref 43–77)
PLATELETS: 271 10*3/uL (ref 150–400)
RBC: 4.52 MIL/uL (ref 3.87–5.11)
RDW: 13.9 % (ref 11.5–15.5)
WBC: 8.7 10*3/uL (ref 4.0–10.5)

## 2014-10-03 LAB — COMPREHENSIVE METABOLIC PANEL
ALT: 23 U/L (ref 14–54)
ANION GAP: 8 (ref 5–15)
AST: 25 U/L (ref 15–41)
Albumin: 4.3 g/dL (ref 3.5–5.0)
Alkaline Phosphatase: 65 U/L (ref 38–126)
BUN: 21 mg/dL — ABNORMAL HIGH (ref 6–20)
CHLORIDE: 106 mmol/L (ref 101–111)
CO2: 26 mmol/L (ref 22–32)
CREATININE: 1.15 mg/dL — AB (ref 0.44–1.00)
Calcium: 10.4 mg/dL — ABNORMAL HIGH (ref 8.9–10.3)
GFR calc non Af Amer: 46 mL/min — ABNORMAL LOW (ref >60)
GFR, EST AFRICAN AMERICAN: 53 mL/min — AB (ref >60)
Glucose, Bld: 97 mg/dL (ref 65–99)
POTASSIUM: 4.2 mmol/L (ref 3.5–5.1)
SODIUM: 140 mmol/L (ref 135–145)
Total Bilirubin: 0.8 mg/dL (ref 0.3–1.2)
Total Protein: 6.9 g/dL (ref 6.5–8.1)

## 2014-10-03 LAB — URINE MICROSCOPIC-ADD ON

## 2014-10-03 LAB — TYPE AND SCREEN
ABO/RH(D): O POS
ANTIBODY SCREEN: NEGATIVE

## 2014-10-03 LAB — PROTIME-INR
INR: 1.1 (ref 0.00–1.49)
Prothrombin Time: 14.4 seconds (ref 11.6–15.2)

## 2014-10-03 LAB — URINALYSIS, ROUTINE W REFLEX MICROSCOPIC
BILIRUBIN URINE: NEGATIVE
GLUCOSE, UA: NEGATIVE mg/dL
HGB URINE DIPSTICK: NEGATIVE
KETONES UR: NEGATIVE mg/dL
Nitrite: NEGATIVE
PH: 6 (ref 5.0–8.0)
PROTEIN: NEGATIVE mg/dL
Specific Gravity, Urine: 1.012 (ref 1.005–1.030)
Urobilinogen, UA: 0.2 mg/dL (ref 0.0–1.0)

## 2014-10-03 LAB — SURGICAL PCR SCREEN
MRSA, PCR: NEGATIVE
STAPHYLOCOCCUS AUREUS: NEGATIVE

## 2014-10-03 LAB — APTT: APTT: 27 s (ref 24–37)

## 2014-10-03 LAB — ABO/RH: ABO/RH(D): O POS

## 2014-10-03 NOTE — Pre-Procedure Instructions (Signed)
    Ashley Cooke  10/03/2014      [id number] - [place], [place] - [address] AT [address] [place] [address] Phone: [phone] Fax: [phone]  [id number] Sherilyn Cooter, MD - [place] [address] [place] [address] Phone: [phone] Fax: [phone]    Your procedure is scheduled on 10-14-2014  Friday .  Report to [facility] Admitting at 5:30 AM    Call this number if you have problems the morning of surgery:  [phone]   Remember:  Do not eat food or drink liquids after midnight.   Take these medicines the morning of surgery with A SIP OF WATER Famotidine(Pepcid),Loratadine (claritin)             Stop Naproxen 7 days prior to surgery 10-07-2014     Do not wear jewelry, make-up or nail polish.  Do not wear lotions, powders, or perfumes.  You may not wear deodorant.  Do not shave 48 hours prior to surgery.  .  Do not bring valuables to the hospital.  [facility] is not responsible for any belongings or valuables.   Contacts, dentures or bridgework may not be worn into surgery.  Leave your suitcase in the car.  After surgery it may be brought to your room.  For patients admitted to the hospital, discharge time will be determined by your treatment team.  Patients discharged the day of surgery will not be allowed to drive home.    Special instructions:  See attached Sheet "Preparing for Surgery" for instructions on CHG showers.  Please read over the following fact sheets that you were given. Pain Booklet, Coughing and Deep Breathing, Blood Transfusion Information and Surgical Site Infection Prevention

## 2014-10-05 ENCOUNTER — Ambulatory Visit (HOSPITAL_COMMUNITY)
Admission: RE | Admit: 2014-10-05 | Discharge: 2014-10-05 | Disposition: A | Discharge status: Home / Self Care | Payer: Medicare Other | Source: Ambulatory Visit | Attending: Nurse Practitioner | Admitting: Nurse Practitioner

## 2014-10-05 DIAGNOSIS — Z1231 Encounter for screening mammogram for malignant neoplasm of breast: Secondary | ICD-10-CM

## 2014-10-05 DIAGNOSIS — D692 Other nonthrombocytopenic purpura: Secondary | ICD-10-CM | POA: Insufficient documentation

## 2014-10-05 DIAGNOSIS — I129 Hypertensive chronic kidney disease with stage 1 through stage 4 chronic kidney disease, or unspecified chronic kidney disease: Secondary | ICD-10-CM | POA: Insufficient documentation

## 2014-10-05 LAB — URINE CULTURE

## 2014-10-06 ENCOUNTER — Ambulatory Visit: Payer: Self-pay | Admitting: Physician Assistant

## 2014-10-06 NOTE — H&P (Signed)
TOTAL KNEE ADMISSION H&P  Patient is being admitted for left total knee arthroplasty.  Subjective:  Chief Complaint:left knee pain.  HPI: Ashley Cooke, 74 y.o. female, has a history of pain and functional disability in the left knee due to arthritis and has failed non-surgical conservative treatments for greater than 12 weeks to includeNSAID's and/or analgesics, corticosteriod injections, viscosupplementation injections and activity modification.  Onset of symptoms was gradual, starting 8 years ago with gradually worsening course since that time. The patient noted no past surgery on the left knee(s).  Patient currently rates pain in the left knee(s) at 8 out of 10 with activity. Patient has night pain, worsening of pain with activity and weight bearing, pain that interferes with activities of daily living, pain with passive range of motion, crepitus and joint swelling.  Patient has evidence of periarticular osteophytes and joint space narrowing by imaging studies.There is no active infection.  Patient Active Problem List   Diagnosis Date Noted  . Pure hypercholesterolemia 10/10/2013  . Postmenopausal hormone replacement therapy 10/10/2013  . Essential hypertension 10/10/2013  . Arthritis 10/10/2013   Past Medical History  Diagnosis Date  . Allergy     seaonal  . Hypertension   . Arthritis   . Cancer     melonoma  11 years ago    Past Surgical History  Procedure Laterality Date  . Tonsillectomy and adenoidectomy  age 61  . Dilation and curettage of uterus  age 35  . 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 Knee arthroscopy Left 05/06/12    torn miniscus     (Not in a hospital admission) No Known Allergies  Social History  Substance Use Topics  . Smoking status: Former Smoker -- 0.50 packs/day for 18 years    Types: Cigarettes    Quit date: 06/21/1973  . Smokeless tobacco: Never Used  . Alcohol Use: 4.2 oz/week    7 Glasses of wine  per week    Family History  Problem Relation Age of Onset  . Colon cancer Neg Hx   . Hypertension Mother   . Hypertension Sister      Review of Systems  Musculoskeletal: Positive for joint pain. Negative for falls.  All other systems reviewed and are negative.   Objective:  Physical Exam  Constitutional: She is oriented to person, place, and time. She appears well-developed and well-nourished. No distress.  HENT:  Head: Normocephalic and atraumatic.  Nose: Nose normal.  Eyes: Conjunctivae and EOM are normal. Pupils are equal, round, and reactive to light.  Neck: Normal range of motion. Neck supple.  Cardiovascular: Normal rate, regular rhythm, normal heart sounds and intact distal pulses.   No murmur heard. Respiratory: Effort normal and breath sounds normal. No respiratory distress. She has no wheezes.  GI: Soft. Bowel sounds are normal. She exhibits no distension. There is no tenderness.  Musculoskeletal:       Left knee: She exhibits swelling. She exhibits no erythema, no LCL laxity and no MCL laxity. Tenderness found.  Lymphadenopathy:    She has no cervical adenopathy.  Neurological: She is alert and oriented to person, place, and time. No cranial nerve deficit.  Skin: Skin is warm and dry. No rash noted. No erythema.  Psychiatric: She has a normal mood and affect. Her behavior is normal.    Vital signs in last 24 hours: @VSRANGES @  Labs:   Estimated body mass index is  31.41 kg/(m^2) as calculated from the following:   Height as of 10/03/14: 5\' 2"  (1.575 m).   Weight as of 10/03/14: 77.928 kg (171 lb 12.8 oz).   Imaging Review Plain radiographs demonstrate moderate degenerative joint disease of the left knee(s). The overall alignment ismild varus. The bone quality appears to be good for age and reported activity level.  Assessment/Plan:  End stage arthritis, left knee   The patient history, physical examination, clinical judgment of the provider and imaging  studies are consistent with end stage degenerative joint disease of the left knee(s) and total knee arthroplasty is deemed medically necessary. The treatment options including medical management, injection therapy arthroscopy and arthroplasty were discussed at length. The risks and benefits of total knee arthroplasty were presented and reviewed. The risks due to aseptic loosening, infection, stiffness, patella tracking problems, thromboembolic complications and other imponderables were discussed. The patient acknowledged the explanation, agreed to proceed with the plan and consent was signed. Patient is being admitted for inpatient treatment for surgery, pain control, PT, OT, prophylactic antibiotics, VTE prophylaxis, progressive ambulation and ADL's and discharge planning. The patient is planning to be discharged home with home health services

## 2014-10-12 ENCOUNTER — Encounter: Payer: Self-pay | Admitting: Nurse Practitioner

## 2014-10-12 ENCOUNTER — Ambulatory Visit (INDEPENDENT_AMBULATORY_CARE_PROVIDER_SITE_OTHER): Payer: Medicare Other | Admitting: Nurse Practitioner

## 2014-10-12 VITALS — BP 128/82 | HR 64 | Ht 62.75 in | Wt 168.0 lb

## 2014-10-12 DIAGNOSIS — Z01419 Encounter for gynecological examination (general) (routine) without abnormal findings: Secondary | ICD-10-CM | POA: Diagnosis not present

## 2014-10-12 DIAGNOSIS — Z0000 Encounter for general adult medical examination without abnormal findings: Secondary | ICD-10-CM

## 2014-10-12 DIAGNOSIS — M1990 Unspecified osteoarthritis, unspecified site: Secondary | ICD-10-CM

## 2014-10-12 DIAGNOSIS — Z79890 Hormone replacement therapy (postmenopausal): Secondary | ICD-10-CM

## 2014-10-12 DIAGNOSIS — I10 Essential (primary) hypertension: Secondary | ICD-10-CM

## 2014-10-12 NOTE — Progress Notes (Signed)
Patient ID: Ashley Cooke, female   DOB: 05-16-1940, 74 y.o.   MRN: [id number] 74 y.o. [id number] Married  Caucasian Fe here for annual exam.  Now off HRT since last visit.  She is also getting prepaartions to have left TKA on Friday.  She is off most medicine.  She already has met with PT and has CPM machine at home.  Plans are for a right TKA in January.  She wants to get back playing tennis.  Patient's last menstrual period was 01/21/1977 (approximate).          Sexually active: Yes.    The current method of family planning is post menopausal status.    Exercising: Yes.    tennis, swimming, snow skiing Smoker:  no  Health Maintenance: Pap: 03/29/2010 Normal  MMG: 10/05/14, Bi-Rads 1: Negative  Colonoscopy: 03/15/11, normal, repeat in 10 years BMD: 2014 with Dr. Osborne Casco, normal  TDaP: 10/2008  Shingles 2007 Prevnar: 2015 Flu vaccine:  Last week Labs: PCP does lab (blood) work   reports that she quit smoking about 41 years ago. Her smoking use included Cigarettes. She has a 9 pack-year smoking history. She has never used smokeless tobacco. She reports that she drinks about 4.2 oz of alcohol per week. She reports that she does not use illicit drugs.  Past Medical History  Diagnosis Date  . Allergy     seaonal  . Hypertension   . Arthritis   . Cancer     melonoma  11 years ago    Past Surgical History  Procedure Laterality Date  . Tonsillectomy and adenoidectomy  age 55  . Dilation and curettage of uterus  age 36  . 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 Knee arthroscopy Left 05/06/12    torn miniscus    Current Outpatient Prescriptions  Medication Sig Dispense Refill  . atorvastatin (LIPITOR) 10 MG tablet Take 1 tablet by mouth daily.    Marland Kitchen losartan (COZAAR) 100 MG tablet Take 50 mg by mouth Daily.    . calcium carbonate (CALCIUM 600) 600 MG TABS Take 600 mg by mouth daily.    . cholecalciferol (VITAMIN D) 1000 UNITS tablet Take  1,000 Units by mouth daily.     . famotidine (PEPCID) 20 MG tablet Take 20 mg by mouth 2 (two) times daily.     Marland Kitchen loratadine (CLARITIN) 10 MG tablet Take 10 mg by mouth daily as needed for allergies.     . Multiple Vitamin (MULTIVITAMIN) tablet Take 1 tablet by mouth daily.    . naproxen sodium (ANAPROX) 220 MG tablet Take 440 mg by mouth 2 (two) times daily with a meal.     . vitamin C (ASCORBIC ACID) 500 MG tablet Take 500 mg by mouth daily.      No current facility-administered medications for this visit.    Family History  Problem Relation Age of Onset  . Colon cancer Neg Hx   . Hypertension Mother   . Hypertension Sister     ROS:  Pertinent items are noted in HPI.  Otherwise, a comprehensive ROS was negative.  Exam:   BP 128/82 mmHg  Pulse 64  Ht 5' 2.75" (1.594 m)  Wt 168 lb (76.204 kg)  BMI 29.99 kg/m2  LMP 01/21/1977 (Approximate) Height: 5' 2.75" (159.4 cm) Ht Readings from Last 3 Encounters:  10/12/14 5' 2.75" (1.594 m)  10/03/14 '5\' 2"'  (1.575 m)  10/08/13  5' 3.5" (1.613 m)    General appearance: alert, cooperative and appears stated age Head: Normocephalic, without obvious abnormality, atraumatic Neck: no adenopathy, supple, symmetrical, trachea midline and thyroid normal to inspection and palpation Lungs: clear to auscultation bilaterally Breasts: normal appearance, no masses or tenderness Heart: regular rate and rhythm Abdomen: soft, non-tender; no masses,  no organomegaly Extremities: extremities normal, atraumatic, no cyanosis or edema Skin: Skin color, texture, turgor normal. No rashes or lesions Lymph nodes: Cervical, supraclavicular, and axillary nodes normal. No abnormal inguinal nodes palpated Neurologic: Grossly normal   Pelvic: External genitalia:  no lesions              Urethra:  normal appearing urethra with no masses, tenderness or lesions              Bartholin's and Skene's: normal                 Vagina: normal appearing vagina with normal  color and discharge, no lesions              Cervix: anteverted              Pap taken: Yes.   Bimanual Exam:  Uterus:  normal size, contour, position, consistency, mobility, non-tender              Adnexa: no mass, fullness, tenderness               Rectovaginal: Confirms               Anus:  normal sphincter tone, no lesions  Chaperone present: yes  A:  Well Woman with normal exam  Postmenopausal on HRT since about 1981 - 09/2013 History of elevated cholesterol  HTN, OA   P:   Reviewed health and wellness pertinent to exam  Pap smear as above  Mammogram is due 09/2015  Counseled on breast self exam, mammography screening, adequate intake of calcium and vitamin D, diet and exercise, Kegel's exercises return annually or prn  An After Visit Summary was printed and given to the patient.

## 2014-10-13 LAB — IPS PAP SMEAR ONLY

## 2014-10-13 MED ORDER — TRANEXAMIC ACID 1000 MG/10ML IV SOLN
1000.0000 mg | INTRAVENOUS | Status: AC
  Administered 2014-10-14: 1000 mg via INTRAVENOUS
  Filled 2014-10-13: qty 10

## 2014-10-14 ENCOUNTER — Inpatient Hospital Stay (HOSPITAL_COMMUNITY): Payer: Medicare Other | Admitting: Vascular Surgery

## 2014-10-14 ENCOUNTER — Encounter (HOSPITAL_COMMUNITY): Payer: Self-pay | Admitting: Anesthesiology

## 2014-10-14 ENCOUNTER — Ambulatory Visit: Payer: Medicare Other | Admitting: Nurse Practitioner

## 2014-10-14 ENCOUNTER — Inpatient Hospital Stay (HOSPITAL_COMMUNITY)
Admission: RE | Admit: 2014-10-14 | Discharge: 2014-10-16 | DRG: 470 | Disposition: A | Discharge status: Home Health | Payer: Medicare Other | Source: Ambulatory Visit | Attending: Orthopedic Surgery | Admitting: Orthopedic Surgery

## 2014-10-14 ENCOUNTER — Encounter (HOSPITAL_COMMUNITY)
Admission: RE | Disposition: A | Discharge status: Home Health | Payer: Self-pay | Source: Ambulatory Visit | Attending: Orthopedic Surgery

## 2014-10-14 DIAGNOSIS — Z87891 Personal history of nicotine dependence: Secondary | ICD-10-CM

## 2014-10-14 DIAGNOSIS — I10 Essential (primary) hypertension: Secondary | ICD-10-CM | POA: Diagnosis present

## 2014-10-14 DIAGNOSIS — M1712 Unilateral primary osteoarthritis, left knee: Secondary | ICD-10-CM | POA: Diagnosis present

## 2014-10-14 DIAGNOSIS — Z8249 Family history of ischemic heart disease and other diseases of the circulatory system: Secondary | ICD-10-CM

## 2014-10-14 DIAGNOSIS — E780 Pure hypercholesterolemia: Secondary | ICD-10-CM | POA: Diagnosis present

## 2014-10-14 DIAGNOSIS — K219 Gastro-esophageal reflux disease without esophagitis: Secondary | ICD-10-CM | POA: Diagnosis present

## 2014-10-14 DIAGNOSIS — M25562 Pain in left knee: Secondary | ICD-10-CM | POA: Diagnosis present

## 2014-10-14 HISTORY — DX: Reserved for concepts with insufficient information to code with codable children: IMO0002

## 2014-10-14 HISTORY — DX: Pure hypercholesterolemia, unspecified: E78.00

## 2014-10-14 HISTORY — PX: TOTAL KNEE ARTHROPLASTY: SHX125

## 2014-10-14 HISTORY — DX: Other seasonal allergic rhinitis: J30.2

## 2014-10-14 HISTORY — DX: Malignant melanoma of unspecified lower limb, including hip: C43.70

## 2014-10-14 SURGERY — ARTHROPLASTY, KNEE, TOTAL
Anesthesia: Spinal | Site: Knee | Laterality: Left

## 2014-10-14 MED ORDER — LORATADINE 10 MG PO TABS: 10.0000 mg | ORAL_TABLET | Freq: Every day | ORAL | Status: DC | PRN

## 2014-10-14 MED ORDER — APIXABAN 2.5 MG PO TABS
2.5000 mg | ORAL_TABLET | Freq: Two times a day (BID) | ORAL | Status: DC
  Administered 2014-10-15 – 2014-10-16 (×3): 2.5 mg via ORAL
  Filled 2014-10-14 (×3): qty 1

## 2014-10-14 MED ORDER — 0.9 % SODIUM CHLORIDE (POUR BTL) OPTIME
TOPICAL | Status: DC | PRN
  Administered 2014-10-14: 1000 mL

## 2014-10-14 MED ORDER — CALCIUM CARBONATE 1250 (500 CA) MG PO TABS
1.0000 | ORAL_TABLET | Freq: Every day | ORAL | Status: DC
  Administered 2014-10-15 – 2014-10-16 (×2): 500 mg via ORAL
  Filled 2014-10-14 (×2): qty 1

## 2014-10-14 MED ORDER — OXYCODONE HCL 5 MG/5ML PO SOLN: 5.0000 mg | Freq: Once | ORAL | Status: DC | PRN

## 2014-10-14 MED ORDER — FAMOTIDINE 20 MG PO TABS
20.0000 mg | ORAL_TABLET | Freq: Two times a day (BID) | ORAL | Status: DC
  Administered 2014-10-15 – 2014-10-16 (×2): 20 mg via ORAL
  Filled 2014-10-14 (×4): qty 1

## 2014-10-14 MED ORDER — CEFAZOLIN SODIUM 1-5 GM-% IV SOLN
1.0000 g | Freq: Four times a day (QID) | INTRAVENOUS | Status: AC
  Administered 2014-10-14 (×2): 1 g via INTRAVENOUS
  Filled 2014-10-14 (×2): qty 50

## 2014-10-14 MED ORDER — ENOXAPARIN SODIUM 30 MG/0.3ML ~~LOC~~ SOLN: 30.0000 mg | Freq: Two times a day (BID) | SUBCUTANEOUS | Status: DC

## 2014-10-14 MED ORDER — SODIUM CHLORIDE 0.9 % IR SOLN
Status: DC | PRN
  Administered 2014-10-14: 3000 mL

## 2014-10-14 MED ORDER — VITAMIN D 1000 UNITS PO TABS
1000.0000 [IU] | ORAL_TABLET | Freq: Every day | ORAL | Status: DC
  Administered 2014-10-14 – 2014-10-16 (×3): 1000 [IU] via ORAL
  Filled 2014-10-14 (×3): qty 1

## 2014-10-14 MED ORDER — MIDAZOLAM HCL 5 MG/5ML IJ SOLN
INTRAMUSCULAR | Status: DC | PRN
  Administered 2014-10-14: 2 mg via INTRAVENOUS

## 2014-10-14 MED ORDER — PROPOFOL 500 MG/50ML IV EMUL
INTRAVENOUS | Status: DC | PRN
  Administered 2014-10-14: 100 ug/kg/min via INTRAVENOUS

## 2014-10-14 MED ORDER — FENTANYL CITRATE (PF) 250 MCG/5ML IJ SOLN
INTRAMUSCULAR | Status: AC
  Filled 2014-10-14: qty 5

## 2014-10-14 MED ORDER — CEFAZOLIN SODIUM-DEXTROSE 2-3 GM-% IV SOLR: 2.0000 g | INTRAVENOUS | Status: DC

## 2014-10-14 MED ORDER — PROPOFOL 10 MG/ML IV BOLUS
INTRAVENOUS | Status: AC
  Filled 2014-10-14: qty 20

## 2014-10-14 MED ORDER — LIDOCAINE HCL (CARDIAC) 20 MG/ML IV SOLN
INTRAVENOUS | Status: DC | PRN
  Administered 2014-10-14: 50 mg via INTRAVENOUS

## 2014-10-14 MED ORDER — BISACODYL 10 MG RE SUPP: 10.0000 mg | Freq: Every day | RECTAL | Status: DC | PRN

## 2014-10-14 MED ORDER — OXYCODONE HCL 5 MG PO TABS
5.0000 mg | ORAL_TABLET | ORAL | Status: DC | PRN
  Administered 2014-10-14 – 2014-10-16 (×11): 10 mg via ORAL
  Filled 2014-10-14 (×11): qty 2

## 2014-10-14 MED ORDER — BUPIVACAINE-EPINEPHRINE (PF) 0.5% -1:200000 IJ SOLN
INTRAMUSCULAR | Status: AC
  Filled 2014-10-14: qty 30

## 2014-10-14 MED ORDER — FLEET ENEMA 7-19 GM/118ML RE ENEM: 1.0000 | ENEMA | Freq: Once | RECTAL | Status: DC | PRN

## 2014-10-14 MED ORDER — PHENOL 1.4 % MT LIQD: 1.0000 | OROMUCOSAL | Status: DC | PRN

## 2014-10-14 MED ORDER — ONE-DAILY MULTI VITAMINS PO TABS: 1.0000 | ORAL_TABLET | Freq: Every day | ORAL | Status: DC

## 2014-10-14 MED ORDER — ALUM & MAG HYDROXIDE-SIMETH 200-200-20 MG/5ML PO SUSP
30.0000 mL | ORAL | Status: DC | PRN
Start: 2014-10-14 — End: 2014-10-16

## 2014-10-14 MED ORDER — OXYCODONE HCL 5 MG PO TABS: ORAL_TABLET | ORAL | Status: DC

## 2014-10-14 MED ORDER — CHLORHEXIDINE GLUCONATE 4 % EX LIQD: 60.0000 mL | Freq: Once | CUTANEOUS | Status: DC

## 2014-10-14 MED ORDER — APIXABAN 2.5 MG PO TABS: 2.5000 mg | ORAL_TABLET | Freq: Two times a day (BID) | ORAL | Status: DC

## 2014-10-14 MED ORDER — OXYCODONE HCL 5 MG PO TABS: 5.0000 mg | ORAL_TABLET | Freq: Once | ORAL | Status: DC | PRN

## 2014-10-14 MED ORDER — MIDAZOLAM HCL 2 MG/2ML IJ SOLN
INTRAMUSCULAR | Status: AC
  Filled 2014-10-14: qty 4

## 2014-10-14 MED ORDER — MENTHOL 3 MG MT LOZG: 1.0000 | LOZENGE | OROMUCOSAL | Status: DC | PRN

## 2014-10-14 MED ORDER — LOSARTAN POTASSIUM 50 MG PO TABS: 50.0000 mg | ORAL_TABLET | Freq: Every day | ORAL | Status: DC

## 2014-10-14 MED ORDER — LOSARTAN POTASSIUM 50 MG PO TABS
50.0000 mg | ORAL_TABLET | Freq: Every day | ORAL | Status: DC
  Administered 2014-10-14 – 2014-10-16 (×3): 50 mg via ORAL
  Filled 2014-10-14 (×3): qty 1

## 2014-10-14 MED ORDER — METOCLOPRAMIDE HCL 5 MG/ML IJ SOLN: 5.0000 mg | Freq: Three times a day (TID) | INTRAMUSCULAR | Status: DC | PRN

## 2014-10-14 MED ORDER — ONDANSETRON HCL 4 MG/2ML IJ SOLN
INTRAMUSCULAR | Status: DC | PRN
  Administered 2014-10-14: 4 mg via INTRAVENOUS

## 2014-10-14 MED ORDER — ADULT MULTIVITAMIN W/MINERALS CH
1.0000 | ORAL_TABLET | Freq: Every day | ORAL | Status: DC
  Administered 2014-10-14 – 2014-10-16 (×3): 1 via ORAL
  Filled 2014-10-14 (×3): qty 1

## 2014-10-14 MED ORDER — ROPIVACAINE HCL 5 MG/ML IJ SOLN
INTRAMUSCULAR | Status: DC | PRN
  Administered 2014-10-14: 20 mL via PERINEURAL

## 2014-10-14 MED ORDER — ACETAMINOPHEN 500 MG PO TABS
1000.0000 mg | ORAL_TABLET | Freq: Four times a day (QID) | ORAL | Status: AC
  Administered 2014-10-14 – 2014-10-15 (×4): 1000 mg via ORAL
  Filled 2014-10-14 (×4): qty 2

## 2014-10-14 MED ORDER — METOCLOPRAMIDE HCL 5 MG PO TABS
5.0000 mg | ORAL_TABLET | Freq: Three times a day (TID) | ORAL | Status: DC | PRN
Start: 2014-10-14 — End: 2014-10-16

## 2014-10-14 MED ORDER — PHENYLEPHRINE HCL 10 MG/ML IJ SOLN
INTRAMUSCULAR | Status: DC | PRN
  Administered 2014-10-14 (×2): 40 ug via INTRAVENOUS

## 2014-10-14 MED ORDER — LACTATED RINGERS IV SOLN
INTRAVENOUS | Status: DC | PRN
  Administered 2014-10-14 (×2): via INTRAVENOUS

## 2014-10-14 MED ORDER — BUPIVACAINE IN DEXTROSE 0.75-8.25 % IT SOLN
INTRATHECAL | Status: DC | PRN
  Administered 2014-10-14: 2 mL via INTRATHECAL

## 2014-10-14 MED ORDER — HYDROMORPHONE HCL 1 MG/ML IJ SOLN: 0.2500 mg | INTRAMUSCULAR | Status: DC | PRN

## 2014-10-14 MED ORDER — DOCUSATE SODIUM 100 MG PO CAPS
100.0000 mg | ORAL_CAPSULE | Freq: Two times a day (BID) | ORAL | Status: DC
  Administered 2014-10-14 – 2014-10-16 (×5): 100 mg via ORAL
  Filled 2014-10-14 (×5): qty 1

## 2014-10-14 MED ORDER — CEFAZOLIN SODIUM-DEXTROSE 2-3 GM-% IV SOLR
INTRAVENOUS | Status: AC
  Administered 2014-10-14: 2 g via INTRAVENOUS
  Filled 2014-10-14: qty 50

## 2014-10-14 MED ORDER — DIPHENHYDRAMINE HCL 12.5 MG/5ML PO ELIX: 12.5000 mg | ORAL_SOLUTION | ORAL | Status: DC | PRN

## 2014-10-14 MED ORDER — ONDANSETRON HCL 4 MG/2ML IJ SOLN
4.0000 mg | Freq: Four times a day (QID) | INTRAMUSCULAR | Status: DC | PRN
  Administered 2014-10-14: 4 mg via INTRAVENOUS
  Filled 2014-10-14: qty 2

## 2014-10-14 MED ORDER — VITAMIN C 500 MG PO TABS
500.0000 mg | ORAL_TABLET | Freq: Every day | ORAL | Status: DC
  Administered 2014-10-14 – 2014-10-16 (×3): 500 mg via ORAL
  Filled 2014-10-14 (×3): qty 1

## 2014-10-14 MED ORDER — ACETAMINOPHEN 650 MG RE SUPP: 650.0000 mg | Freq: Four times a day (QID) | RECTAL | Status: DC | PRN

## 2014-10-14 MED ORDER — ACETAMINOPHEN 160 MG/5ML PO SOLN
325.0000 mg | ORAL | Status: DC | PRN
  Filled 2014-10-14: qty 20.3

## 2014-10-14 MED ORDER — SODIUM CHLORIDE 0.9 % IV SOLN: INTRAVENOUS | Status: DC

## 2014-10-14 MED ORDER — HYDROMORPHONE HCL 1 MG/ML IJ SOLN
0.5000 mg | INTRAMUSCULAR | Status: DC | PRN
  Administered 2014-10-14 – 2014-10-15 (×4): 0.5 mg via INTRAVENOUS
  Filled 2014-10-14 (×4): qty 1

## 2014-10-14 MED ORDER — ONDANSETRON HCL 4 MG PO TABS: 4.0000 mg | ORAL_TABLET | Freq: Four times a day (QID) | ORAL | Status: DC | PRN

## 2014-10-14 MED ORDER — POLYETHYLENE GLYCOL 3350 17 G PO PACK: 17.0000 g | PACK | Freq: Every day | ORAL | Status: DC | PRN

## 2014-10-14 MED ORDER — ACETAMINOPHEN 325 MG PO TABS
650.0000 mg | ORAL_TABLET | Freq: Four times a day (QID) | ORAL | Status: DC | PRN
  Filled 2014-10-14: qty 2

## 2014-10-14 MED ORDER — FENTANYL CITRATE (PF) 100 MCG/2ML IJ SOLN
INTRAMUSCULAR | Status: DC | PRN
  Administered 2014-10-14 (×5): 50 ug via INTRAVENOUS

## 2014-10-14 MED ORDER — EPHEDRINE SULFATE 50 MG/ML IJ SOLN
INTRAMUSCULAR | Status: DC | PRN
  Administered 2014-10-14 (×2): 5 mg via INTRAVENOUS

## 2014-10-14 MED ORDER — CALCIUM CARBONATE 600 MG PO TABS: 600.0000 mg | ORAL_TABLET | Freq: Every day | ORAL | Status: DC

## 2014-10-14 MED ORDER — ATORVASTATIN CALCIUM 10 MG PO TABS
10.0000 mg | ORAL_TABLET | Freq: Every day | ORAL | Status: DC
  Administered 2014-10-14 – 2014-10-15 (×2): 10 mg via ORAL
  Filled 2014-10-14 (×2): qty 1

## 2014-10-14 MED ORDER — SODIUM CHLORIDE 0.9 % IV SOLN
INTRAVENOUS | Status: DC
  Administered 2014-10-14 – 2014-10-15 (×2): via INTRAVENOUS

## 2014-10-14 MED ORDER — ACETAMINOPHEN 325 MG PO TABS: 325.0000 mg | ORAL_TABLET | ORAL | Status: DC | PRN

## 2014-10-14 SURGICAL SUPPLY — 66 items
BANDAGE ELASTIC 4 VELCRO ST LF (GAUZE/BANDAGES/DRESSINGS) ×3 IMPLANT
BANDAGE ELASTIC 6 VELCRO ST LF (GAUZE/BANDAGES/DRESSINGS) ×3 IMPLANT
BANDAGE ESMARK 6X9 LF (GAUZE/BANDAGES/DRESSINGS) ×1 IMPLANT
BLADE SAGITTAL 25.0X1.19X90 (BLADE) ×2 IMPLANT
BLADE SAGITTAL 25.0X1.19X90MM (BLADE) ×1
BLADE SAW SAG 90X13X1.27 (BLADE) ×3 IMPLANT
BNDG CMPR 9X6 STRL LF SNTH (GAUZE/BANDAGES/DRESSINGS) ×1
BNDG ESMARK 6X9 LF (GAUZE/BANDAGES/DRESSINGS) ×3
BOWL SMART MIX CTS (DISPOSABLE) ×3 IMPLANT
CAP KNEE TOTAL 3 SIGMA ×2 IMPLANT
CEMENT HV SMART SET (Cement) ×4 IMPLANT
COVER SURGICAL LIGHT HANDLE (MISCELLANEOUS) ×3 IMPLANT
CUFF TOURNIQUET SINGLE 34IN LL (TOURNIQUET CUFF) ×3 IMPLANT
CUFF TOURNIQUET SINGLE 44IN (TOURNIQUET CUFF) IMPLANT
DRAPE IMP U-DRAPE 54X76 (DRAPES) ×3 IMPLANT
DRAPE INCISE IOBAN 66X45 STRL (DRAPES) IMPLANT
DRAPE ORTHO SPLIT 77X108 STRL (DRAPES) ×6
DRAPE SURG ORHT 6 SPLT 77X108 (DRAPES) ×2 IMPLANT
DRAPE U-SHAPE 47X51 STRL (DRAPES) ×3 IMPLANT
DRSG ADAPTIC 3X8 NADH LF (GAUZE/BANDAGES/DRESSINGS) ×3 IMPLANT
DRSG PAD ABDOMINAL 8X10 ST (GAUZE/BANDAGES/DRESSINGS) ×4 IMPLANT
DURAPREP 26ML APPLICATOR (WOUND CARE) ×3 IMPLANT
ELECT REM PT RETURN 9FT ADLT (ELECTROSURGICAL) ×3
ELECTRODE REM PT RTRN 9FT ADLT (ELECTROSURGICAL) ×1 IMPLANT
EVACUATOR 1/8 PVC DRAIN (DRAIN) ×2 IMPLANT
FACESHIELD WRAPAROUND (MASK) ×6 IMPLANT
FACESHIELD WRAPAROUND OR TEAM (MASK) ×2 IMPLANT
FLOSEAL 10ML (HEMOSTASIS) IMPLANT
GAUZE SPONGE 4X4 12PLY STRL (GAUZE/BANDAGES/DRESSINGS) ×3 IMPLANT
GLOVE BIOGEL PI IND STRL 8 (GLOVE) ×4 IMPLANT
GLOVE BIOGEL PI INDICATOR 8 (GLOVE) ×8
GLOVE ORTHO TXT STRL SZ7.5 (GLOVE) ×9 IMPLANT
GLOVE SURG ORTHO 8.0 STRL STRW (GLOVE) ×9 IMPLANT
GOWN STRL REUS W/ TWL LRG LVL3 (GOWN DISPOSABLE) ×2 IMPLANT
GOWN STRL REUS W/ TWL XL LVL3 (GOWN DISPOSABLE) ×1 IMPLANT
GOWN STRL REUS W/TWL 2XL LVL3 (GOWN DISPOSABLE) ×3 IMPLANT
GOWN STRL REUS W/TWL LRG LVL3 (GOWN DISPOSABLE) ×6
GOWN STRL REUS W/TWL XL LVL3 (GOWN DISPOSABLE) ×3
HANDPIECE INTERPULSE COAX TIP (DISPOSABLE) ×3
HOOD PEEL AWAY FACE SHEILD DIS (HOOD) ×3 IMPLANT
IMMOBILIZER KNEE 22 (SOFTGOODS) ×2 IMPLANT
IMMOBILIZER KNEE 22 UNIV (SOFTGOODS) IMPLANT
KIT BASIN OR (CUSTOM PROCEDURE TRAY) ×3 IMPLANT
KIT ROOM TURNOVER OR (KITS) ×3 IMPLANT
MANIFOLD NEPTUNE II (INSTRUMENTS) ×3 IMPLANT
NEEDLE 22X1 1/2 (OR ONLY) (NEEDLE) IMPLANT
NS IRRIG 1000ML POUR BTL (IV SOLUTION) ×3 IMPLANT
PACK TOTAL JOINT (CUSTOM PROCEDURE TRAY) ×3 IMPLANT
PACK UNIVERSAL I (CUSTOM PROCEDURE TRAY) ×3 IMPLANT
PAD ARMBOARD 7.5X6 YLW CONV (MISCELLANEOUS) ×6 IMPLANT
PAD CAST 4YDX4 CTTN HI CHSV (CAST SUPPLIES) ×1 IMPLANT
PADDING CAST COTTON 4X4 STRL (CAST SUPPLIES) ×3
PADDING CAST COTTON 6X4 STRL (CAST SUPPLIES) ×3 IMPLANT
SET HNDPC FAN SPRY TIP SCT (DISPOSABLE) ×1 IMPLANT
STAPLER VISISTAT 35W (STAPLE) ×3 IMPLANT
SUCTION FRAZIER TIP 10 FR DISP (SUCTIONS) ×3 IMPLANT
SUT ETHIBOND NAB CT1 #1 30IN (SUTURE) ×9 IMPLANT
SUT VIC AB 0 CT1 27 (SUTURE) ×3
SUT VIC AB 0 CT1 27XBRD ANBCTR (SUTURE) ×1 IMPLANT
SUT VIC AB 2-0 CT1 27 (SUTURE) ×6
SUT VIC AB 2-0 CT1 TAPERPNT 27 (SUTURE) ×2 IMPLANT
SYR CONTROL 10ML LL (SYRINGE) IMPLANT
TOWEL OR 17X24 6PK STRL BLUE (TOWEL DISPOSABLE) ×3 IMPLANT
TOWEL OR 17X26 10 PK STRL BLUE (TOWEL DISPOSABLE) ×3 IMPLANT
TRAY FOLEY CATH 16FRSI W/METER (SET/KITS/TRAYS/PACK) IMPLANT
WATER STERILE IRR 1000ML POUR (IV SOLUTION) ×2 IMPLANT

## 2014-10-14 NOTE — Progress Notes (Signed)
Orthopedic Tech Progress Note Patient Details:  Ashley Cooke 05-10-1940 [id number]  CPM Left Knee CPM Left Knee: On Left Knee Flexion (Degrees): 90 Left Knee Extension (Degrees): 0 Additional Comments: trapeze bar patient helper Viewed order from doctor's order list  Ashley Cooke 10/14/2014, 10:16 AM

## 2014-10-14 NOTE — Brief Op Note (Signed)
10/14/2014  9:48 AM  PATIENT:  Lowell Bouton  74 y.o. female  PRE-OPERATIVE DIAGNOSIS:  djd left knee  POST-OPERATIVE DIAGNOSIS:  djd left knee  PROCEDURE:  Procedure(s): TOTAL KNEE ARTHROPLASTY (Left)  SURGEON:  Surgeon(s) and Role:    * Earlie Server, MD - Primary  PHYSICIAN ASSISTANT: Chriss Czar, PA-C  ASSISTANTS:   ANESTHESIA:   spinal and IV sedation  EBL:  Total I/O In: 1000 [I.V.:1000] Out: -   BLOOD ADMINISTERED:none  DRAINS: 1 hemovac drain lateral left knee self suction   LOCAL MEDICATIONS USED:  NONE  SPECIMEN:  No Specimen  DISPOSITION OF SPECIMEN:  N/A  COUNTS:  YES  TOURNIQUET:   Total Tourniquet Time Documented: Thigh (Left) - 56 minutes Total: Thigh (Left) - 56 minutes   DICTATION: .Other Dictation: Dictation Number unknown  PLAN OF CARE: Admit to inpatient   PATIENT DISPOSITION:  PACU - hemodynamically stable.   Delay start of Pharmacological VTE agent (>24hrs) due to surgical blood loss or risk of bleeding: yes

## 2014-10-14 NOTE — Discharge Instructions (Signed)
INSTRUCTIONS AFTER JOINT REPLACEMENT  ° °o Remove items at home which could result in a fall. This includes throw rugs or furniture in walking pathways °o ICE to the affected joint every three hours while awake for 30 minutes at a time, for at least the first 3-5 days, and then as needed for pain and swelling.  Continue to use ice for pain and swelling. You may notice swelling that will progress down to the foot and ankle.  This is normal after surgery.  Elevate your leg when you are not up walking on it.   °o Continue to use the breathing machine you got in the hospital (incentive spirometer) which will help keep your temperature down.  It is common for your temperature to cycle up and down following surgery, especially at night when you are not up moving around and exerting yourself.  The breathing machine keeps your lungs expanded and your temperature down. ° ° °DIET:  As you were doing prior to hospitalization, we recommend a well-balanced diet. ° °DRESSING / WOUND CARE / SHOWERING ° °You may change your dressing 3-5 days after surgery.  Then change the dressing every day with sterile gauze.  Please use good hand washing techniques before changing the dressing.  Do not use any lotions or creams on the incision until instructed by your surgeon. ° °ACTIVITY ° °o Increase activity slowly as tolerated, but follow the weight bearing instructions below.   °o No driving for 6 weeks or until further direction given by your physician.  You cannot drive while taking narcotics.  °o No lifting or carrying greater than 10 lbs. until further directed by your surgeon. °o Avoid periods of inactivity such as sitting longer than an hour when not asleep. This helps prevent blood clots.  °o You may return to work once you are authorized by your doctor.  ° ° ° °WEIGHT BEARING  ° °Weight bearing as tolerated with assist device (walker, cane, etc) as directed, use it as long as suggested by your surgeon or therapist, typically at  least 4-6 weeks. ° ° °EXERCISES ° °Results after joint replacement surgery are often greatly improved when you follow the exercise, range of motion and muscle strengthening exercises prescribed by your doctor. Safety measures are also important to protect the joint from further injury. Any time any of these exercises cause you to have increased pain or swelling, decrease what you are doing until you are comfortable again and then slowly increase them. If you have problems or questions, call your caregiver or physical therapist for advice.  ° °Rehabilitation is important following a joint replacement. After just a few days of immobilization, the muscles of the leg can become weakened and shrink (atrophy).  These exercises are designed to build up the tone and strength of the thigh and leg muscles and to improve motion. Often times heat used for twenty to thirty minutes before working out will loosen up your tissues and help with improving the range of motion but do not use heat for the first two weeks following surgery (sometimes heat can increase post-operative swelling).  ° °These exercises can be done on a training (exercise) mat, on the floor, on a table or on a bed. Use whatever works the best and is most comfortable for you.    Use music or television while you are exercising so that the exercises are a pleasant break in your day. This will make your life better with the exercises acting as a break   in your routine that you can look forward to.   Perform all exercises about fifteen times, three times per day or as directed.  You should exercise both the operative leg and the other leg as well. ° °Exercises include: °  °• Quad Sets - Tighten up the muscle on the front of the thigh (Quad) and hold for 5-10 seconds.   °• Straight Leg Raises - With your knee straight (if you were given a brace, keep it on), lift the leg to 60 degrees, hold for 3 seconds, and slowly lower the leg.  Perform this exercise against  resistance later as your leg gets stronger.  °• Leg Slides: Lying on your back, slowly slide your foot toward your buttocks, bending your knee up off the floor (only go as far as is comfortable). Then slowly slide your foot back down until your leg is flat on the floor again.  °• Angel Wings: Lying on your back spread your legs to the side as far apart as you can without causing discomfort.  °• Hamstring Strength:  Lying on your back, push your heel against the floor with your leg straight by tightening up the muscles of your buttocks.  Repeat, but this time bend your knee to a comfortable angle, and push your heel against the floor.  You may put a pillow under the heel to make it more comfortable if necessary.  ° °A rehabilitation program following joint replacement surgery can speed recovery and prevent re-injury in the future due to weakened muscles. Contact your doctor or a physical therapist for more information on knee rehabilitation.  ° ° °CONSTIPATION ° °Constipation is defined medically as fewer than three stools per week and severe constipation as less than one stool per week.  Even if you have a regular bowel pattern at home, your normal regimen is likely to be disrupted due to multiple reasons following surgery.  Combination of anesthesia, postoperative narcotics, change in appetite and fluid intake all can affect your bowels.  ° °YOU MUST use at least one of the following options; they are listed in order of increasing strength to get the job done.  They are all available over the counter, and you may need to use some, POSSIBLY even all of these options:   ° °Drink plenty of fluids (prune juice may be helpful) and high fiber foods °Colace 100 mg by mouth twice a day  °Senokot for constipation as directed and as needed Dulcolax (bisacodyl), take with full glass of water  °Miralax (polyethylene glycol) once or twice a day as needed. ° °If you have tried all these things and are unable to have a bowel  movement in the first 3-4 days after surgery call either your surgeon or your primary doctor.   ° °If you experience loose stools or diarrhea, hold the medications until you stool forms back up.  If your symptoms do not get better within 1 week or if they get worse, check with your doctor.  If you experience "the worst abdominal pain ever" or develop nausea or vomiting, please contact the office immediately for further recommendations for treatment. ° ° °ITCHING:  If you experience itching with your medications, try taking only a single pain pill, or even half a pain pill at a time.  You can also use Benadryl over the counter for itching or also to help with sleep.  ° °TED HOSE STOCKINGS:  Use stockings on both legs until for at least 2 weeks or as   directed by physician office. They may be removed at night for sleeping.  MEDICATIONS:  See your medication summary on the After Visit Summary that nursing will review with you.  You may have some home medications which will be placed on hold until you complete the course of blood thinner medication.  It is important for you to complete the blood thinner medication as prescribed.  PRECAUTIONS:  If you experience chest pain or shortness of breath - call 911 immediately for transfer to the hospital emergency department.   If you develop a fever greater that 101 F, purulent drainage from wound, increased redness or drainage from wound, foul odor from the wound/dressing, or calf pain - CONTACT YOUR SURGEON.                                                   FOLLOW-UP APPOINTMENTS:  If you do not already have a post-op appointment, please call the office for an appointment to be seen by your surgeon.  Guidelines for how soon to be seen are listed in your After Visit Summary, but are typically between 1-4 weeks after surgery.  OTHER INSTRUCTIONS:   Knee Replacement:  Do not place pillow under knee, focus on keeping the knee straight while resting. CPM  instructions: 0-90 degrees, 2 hours in the morning, 2 hours in the afternoon, and 2 hours in the evening. Place foam block, curve side up under heel at all times except when in CPM or when walking.  DO NOT modify, tear, cut, or change the foam block in any way.  MAKE SURE YOU:   Understand these instructions.   Get help right away if you are not doing well or get worse.    Thank you for letting us be a part of your medical care team.  It is a privilege we respect greatly.  We hope these instructions will help you stay on track for a fast and full recovery!  --------------------------------------------------------------------------------------------------------------------------------------------------------------------------------------- Information on my medicine - ELIQUIS (apixaban)  This medication education was reviewed with me or my healthcare representative as part of my discharge preparation.  The pharmacist that spoke with me during my hospital stay was:  Ritaj Dullea, RPH  Why was Eliquis prescribed for you? Eliquis was prescribed for you to reduce the risk of blood clots forming after orthopedic surgery.    What do You need to know about Eliquis? Take your Eliquis TWICE DAILY - one tablet in the morning and one tablet in the evening with or without food.  It would be best to take the dose about the same time each day.  If you have difficulty swallowing the tablet whole please discuss with your pharmacist how to take the medication safely.  Take Eliquis exactly as prescribed by your doctor and DO NOT stop taking Eliquis without talking to the doctor who prescribed the medication.  Stopping without other medication to take the place of Eliquis may increase your risk of developing a clot.  After discharge, you should have regular check-up appointments with your healthcare provider that is prescribing your Eliquis.  What do you do if you miss a dose? If a dose of ELIQUIS  is not taken at the scheduled time, take it as soon as possible on the same day and twice-daily administration should be resumed.  The dose should not be doubled  to make up for a missed dose.  Do not take more than one tablet of ELIQUIS at the same time.  Important Safety Information A possible side effect of Eliquis is bleeding. You should call your healthcare provider right away if you experience any of the following: ? Bleeding from an injury or your nose that does not stop. ? Unusual colored urine (red or dark brown) or unusual colored stools (red or black). ? Unusual bruising for unknown reasons. ? A serious fall or if you hit your head (even if there is no bleeding).  Some medicines may interact with Eliquis and might increase your risk of bleeding or clotting while on Eliquis. To help avoid this, consult your healthcare provider or pharmacist prior to using any new prescription or non-prescription medications, including herbals, vitamins, non-steroidal anti-inflammatory drugs (NSAIDs) and supplements.  This website has more information on Eliquis (apixaban): http://www.eliquis.com/eliquis/home

## 2014-10-14 NOTE — Op Note (Signed)
NAMEAddasyn, Mcbreen Kaya                   ACCOUNT NO.:  [id number]  MEDICAL RECORD NO.:  [id number]  LOCATION:  [facility]                         FACILITY:  [facility]  PHYSICIAN:  Lockie Pares, M.D.    DATE OF BIRTH:  May 15, 1940  DATE OF PROCEDURE:  10/14/2014 DATE OF DISCHARGE:                              OPERATIVE REPORT   PREOPERATIVE DIAGNOSIS:  Osteoarthritis of left knee.  POSTOPERATIVE DIAGNOSIS:  Osteoarthritis of left knee.  OPERATION:  Left total knee replacement (Sigma size 3 femur tibia with 12.5 mm bearing and 32 mm all poly patella.  SURGEON:  Lockie Pares, M.D.  ASSISTANT:  Chriss Czar, PA-C.  ANESTHETIC:  Spinal anesthetic with femoral nerve block.  TOURNIQUET TIME:  50 minutes.  DESCRIPTION OF PROCEDURE:  Sterile prep and drape, exsanguination of the leg inflation to 350.  Straight skin incision with medial parapatellar approach to the knee made.  We identified a moderately severe varus deformity with medial stripping.  We then cut 11 mm 5 degree valgus cut on the femur followed by 4 mm below the most diseased medial compartment on the tibia with an external guide.  The extension gap was measured at 12.5 mm.  Size of the femur to be a size 3 followed by placement of the all-in-1 cutting block with the appropriate degree of external rotation and accomplished anterior-posterior cuts as well as the chamfers.  We resected the PCL, excess menisci were removed from the posterior aspect of the knee.  Flexion gap was measured at 12.5 mm.  Keel hole was cut for the tibia followed by the box cut of the femur, trial femur and tibia placed.  Full extension was obtained with 12.5 mm bearing we cut.  We then went left about 14 mm of native patella in place of the patellar trial.  Full extension was noted.  Good balancing of the ligaments and restoration of the normal axis with resolution of the varus deformity.  Drawer was negligible with the knee flexed.  The trial  components were removed.  Bony surfaces were irrigated.  We inserted normal cement on the tibia followed by femur patella.  We did elect to use a trial bearing while the cement was hardening.  Trial bearing was removed.  No excess cement was noted.  We then released the tourniquet.  No excessive bleeding was noted.  Small bleeders were coagulated.  Final bearing was placed.  Hemovac drain was placed exiting superolaterally.  Closure was affected with #1 Ethibond, 0-2 Vicryl and skin clips on the skin.  Lightly compressive sterile dressing and knee immobilizer applied, taken to recovery room in stable condition.     Lockie Pares, M.D.     WDC/MEDQ  D:  10/14/2014  T:  10/14/2014  Job:  [id number]

## 2014-10-14 NOTE — Progress Notes (Signed)
Utilization review completed.  

## 2014-10-14 NOTE — H&P (View-Only) (Signed)
TOTAL KNEE ADMISSION H&P  Patient is being admitted for left total knee arthroplasty.  Subjective:  Chief Complaint:left knee pain.  HPI: Ashley Cooke, 74 y.o. female, has a history of pain and functional disability in the left knee due to arthritis and has failed non-surgical conservative treatments for greater than 12 weeks to includeNSAID's and/or analgesics, corticosteriod injections, viscosupplementation injections and activity modification.  Onset of symptoms was gradual, starting 8 years ago with gradually worsening course since that time. The patient noted no past surgery on the left knee(s).  Patient currently rates pain in the left knee(s) at 8 out of 10 with activity. Patient has night pain, worsening of pain with activity and weight bearing, pain that interferes with activities of daily living, pain with passive range of motion, crepitus and joint swelling.  Patient has evidence of periarticular osteophytes and joint space narrowing by imaging studies.There is no active infection.  Patient Active Problem List   Diagnosis Date Noted  . Pure hypercholesterolemia 10/10/2013  . Postmenopausal hormone replacement therapy 10/10/2013  . Essential hypertension 10/10/2013  . Arthritis 10/10/2013   Past Medical History  Diagnosis Date  . Allergy     seaonal  . Hypertension   . Arthritis   . Cancer     melonoma  11 years ago    Past Surgical History  Procedure Laterality Date  . Tonsillectomy and adenoidectomy  age 84  . Dilation and curettage of uterus  age 57  . 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 Knee arthroscopy Left 05/06/12    torn miniscus     (Not in a hospital admission) No Known Allergies  Social History  Substance Use Topics  . Smoking status: Former Smoker -- 0.50 packs/day for 18 years    Types: Cigarettes    Quit date: 06/21/1973  . Smokeless tobacco: Never Used  . Alcohol Use: 4.2 oz/week    7 Glasses of wine  per week    Family History  Problem Relation Age of Onset  . Colon cancer Neg Hx   . Hypertension Mother   . Hypertension Sister      Review of Systems  Musculoskeletal: Positive for joint pain. Negative for falls.  All other systems reviewed and are negative.   Objective:  Physical Exam  Constitutional: She is oriented to person, place, and time. She appears well-developed and well-nourished. No distress.  HENT:  Head: Normocephalic and atraumatic.  Nose: Nose normal.  Eyes: Conjunctivae and EOM are normal. Pupils are equal, round, and reactive to light.  Neck: Normal range of motion. Neck supple.  Cardiovascular: Normal rate, regular rhythm, normal heart sounds and intact distal pulses.   No murmur heard. Respiratory: Effort normal and breath sounds normal. No respiratory distress. She has no wheezes.  GI: Soft. Bowel sounds are normal. She exhibits no distension. There is no tenderness.  Musculoskeletal:       Left knee: She exhibits swelling. She exhibits no erythema, no LCL laxity and no MCL laxity. Tenderness found.  Lymphadenopathy:    She has no cervical adenopathy.  Neurological: She is alert and oriented to person, place, and time. No cranial nerve deficit.  Skin: Skin is warm and dry. No rash noted. No erythema.  Psychiatric: She has a normal mood and affect. Her behavior is normal.    Vital signs in last 24 hours: @VSRANGES @  Labs:   Estimated body mass index is  31.41 kg/(m^2) as calculated from the following:   Height as of 10/03/14: 5\' 2"  (1.575 m).   Weight as of 10/03/14: 77.928 kg (171 lb 12.8 oz).   Imaging Review Plain radiographs demonstrate moderate degenerative joint disease of the left knee(s). The overall alignment ismild varus. The bone quality appears to be good for age and reported activity level.  Assessment/Plan:  End stage arthritis, left knee   The patient history, physical examination, clinical judgment of the provider and imaging  studies are consistent with end stage degenerative joint disease of the left knee(s) and total knee arthroplasty is deemed medically necessary. The treatment options including medical management, injection therapy arthroscopy and arthroplasty were discussed at length. The risks and benefits of total knee arthroplasty were presented and reviewed. The risks due to aseptic loosening, infection, stiffness, patella tracking problems, thromboembolic complications and other imponderables were discussed. The patient acknowledged the explanation, agreed to proceed with the plan and consent was signed. Patient is being admitted for inpatient treatment for surgery, pain control, PT, OT, prophylactic antibiotics, VTE prophylaxis, progressive ambulation and ADL's and discharge planning. The patient is planning to be discharged home with home health services

## 2014-10-14 NOTE — Evaluation (Signed)
Physical Therapy Evaluation Patient Details Name: Ashley Cooke MRN: [id number] DOB: 1940/09/29 Today's Date: 10/14/2014   History of Present Illness  Patient is a 74 y/o female s/p L TKA. PMH includes HTN and cancer.  Clinical Impression  Patient presents with pain and post surgical deficits LLE s/p left TKA. Tolerated SPT x2 to [facility] and chair with Min A. Ambulation deferred secondary to left knee buckling with weight bearing and transfers. Pt highly motivated to return to functional independence. Will have support from spouse at home. Instructed pt in exercises. Will follow acutely to maximize independence and mobility prior to return home.     Follow Up Recommendations Home health PT;Supervision/Assistance - 24 hour    Equipment Recommendations  None recommended by PT    Recommendations for Other Services       Precautions / Restrictions Precautions Precautions: Fall;Knee Precaution Booklet Issued: No Precaution Comments: Reviewed no pillow under knee and precautions. Required Braces or Orthoses: Knee Immobilizer - Left Knee Immobilizer - Left: On when out of bed or walking Restrictions Weight Bearing Restrictions: Yes LLE Weight Bearing: Weight bearing as tolerated      Mobility  Bed Mobility Overal bed mobility: Needs Assistance Bed Mobility: Supine to Sit     Supine to sit: Supervision;HOB elevated     General bed mobility comments: USe of rails for support.   Transfers Overall transfer level: Needs assistance Equipment used: Rolling walker (2 wheeled) Transfers: Sit to/from [facility] Sit to Stand: Min guard Stand pivot transfers: Min assist       General transfer comment: Min guard for safety. left knee buckling. SPT bed to [facility] and BSC to chair with Min A for balance due to knee buckling.  Ambulation/Gait Ambulation/Gait assistance:  (deferred due to left knee instability.)              Stairs            Wheelchair  Mobility    Modified Rankin (Stroke Patients Only)       Balance Overall balance assessment: Needs assistance Sitting-balance support: Feet supported;No upper extremity supported Sitting balance-Leahy Scale: Good     Standing balance support: During functional activity Standing balance-Leahy Scale: Poor Standing balance comment: Relient on RW for support.                              Pertinent Vitals/Pain Pain Assessment: Faces Faces Pain Scale: Hurts even more Pain Location: left knee Pain Descriptors / Indicators: Sore;Aching Pain Intervention(s): Monitored during session;Repositioned;Patient requesting pain meds-RN notified;Limited activity within patient's tolerance    Home Living Family/patient expects to be discharged to:: Private residence Living Arrangements: Spouse/significant other Available Help at Discharge: Family;Available 24 hours/day Type of Home: House Home Access: Stairs to enter Entrance Stairs-Rails: None Entrance Stairs-Number of Steps: 1 Home Layout: One level Home Equipment: Walker - 2 wheels;Bedside commode;Shower seat      Prior Function Level of Independence: Independent         Comments: Pt very active- plays tennis, does tai chi, goes to the gym and swims. Was skiing last winter.     Hand Dominance        Extremity/Trunk Assessment   Upper Extremity Assessment: Defer to OT evaluation           Lower Extremity Assessment: LLE deficits/detail   LLE Deficits / Details: Limited AROM/strength secondary to pain, nerve block and surgery. Not able to perform QS.  Communication   Communication: No difficulties  Cognition Arousal/Alertness: Awake/alert Behavior During Therapy: WFL for tasks assessed/performed;Impulsive Overall Cognitive Status: Within Functional Limits for tasks assessed                      General Comments      Exercises Total Joint Exercises Ankle Circles/Pumps: Both;10  reps;Seated Quad Sets: Both;10 reps;Seated Gluteal Sets: Both;10 reps;Seated      Assessment/Plan    PT Assessment Patient needs continued PT services  PT Diagnosis Difficulty walking;Acute pain;Generalized weakness   PT Problem List Decreased strength;Pain;Decreased range of motion;Impaired sensation;Decreased activity tolerance;Decreased balance;Decreased mobility  PT Treatment Interventions Balance training;Gait training;Stair training;Functional mobility training;Therapeutic activities;Therapeutic exercise;Patient/family education   PT Goals (Current goals can be found in the Care Plan section) Acute Rehab PT Goals Patient Stated Goal: to get back to tennis PT Goal Formulation: With patient Time For Goal Achievement: 10/28/14 Potential to Achieve Goals: Good    Frequency 7X/week   Barriers to discharge        Co-evaluation               End of Session Equipment Utilized During Treatment: Gait belt Activity Tolerance: Patient tolerated treatment well Patient left: in chair;with call bell/phone within reach Nurse Communication: Mobility status;Other (comment) (tech notified of transfer technique and need for KI due to left knee buckling.)         Time: 1749-4496 PT Time Calculation (min) (ACUTE ONLY): 26 min   Charges:   PT Evaluation $Initial PT Evaluation Tier I: 1 Procedure PT Treatments $Therapeutic Activity: 8-22 mins   PT G Codes:        Lynnea Vandervoort A Izabellah Dadisman 10/14/2014, 2:58 PM Wray Kearns, Ida, DPT [phone]

## 2014-10-14 NOTE — Anesthesia Postprocedure Evaluation (Signed)
  Anesthesia Post-op Note  Patient: Ashley Cooke  Procedure(s) Performed: Procedure(s): TOTAL KNEE ARTHROPLASTY (Left)  Patient Location: PACU  Anesthesia Type:Regional and Spinal  Level of Consciousness: awake  Airway and Oxygen Therapy: Patient Spontanous Breathing  Post-op Pain: none  Post-op Assessment: Post-op Vital signs reviewed, Patient's Cardiovascular Status Stable, Respiratory Function Stable, Patent Airway, No signs of Nausea or vomiting and Pain level controlled LLE Motor Response: Purposeful movement, Responds to commands LLE Sensation: Numbness (slight) RLE Motor Response: Purposeful movement, Responds to commands RLE Sensation: Numbness (slight)      Post-op Vital Signs: Reviewed and stable  Last Vitals:  Filed Vitals:   10/14/14 1156  BP: 122/70  Pulse: 67  Temp: 35.8 C  Resp: 17    Complications: No apparent anesthesia complications

## 2014-10-14 NOTE — Plan of Care (Signed)
Problem: Consults Goal: Diagnosis- Total Joint Replacement Primary Total Knee Left     

## 2014-10-14 NOTE — Interval H&P Note (Signed)
History and Physical Interval Note:  10/14/2014 7:30 AM  Ashley Cooke  has presented today for surgery, with the diagnosis of djd left knee  The various methods of treatment have been discussed with the patient and family. After consideration of risks, benefits and other options for treatment, the patient has consented to  Procedure(s): TOTAL KNEE ARTHROPLASTY (Left) as a surgical intervention .  The patient's history has been reviewed, patient examined, no change in status, stable for surgery.  I have reviewed the patient's chart and labs.  Questions were answered to the patient's satisfaction.     CAFFREY JR,W D

## 2014-10-14 NOTE — Anesthesia Preprocedure Evaluation (Addendum)
Anesthesia Evaluation  Patient identified by MRN, date of birth, ID band Patient awake    Reviewed: Allergy & Precautions, NPO status , Patient's Chart, lab work & pertinent test results  History of Anesthesia Complications Negative for: history of anesthetic complications  Airway Mallampati: II  TM Distance: >3 FB Neck ROM: Full    Dental  (+) Teeth Intact   Pulmonary neg shortness of breath, neg sleep apnea, neg COPD, neg recent URI, former smoker, neg PE   breath sounds clear to auscultation       Cardiovascular hypertension, Pt. on medications negative cardio ROS   Rhythm:Regular     Neuro/Psych negative neurological ROS  negative psych ROS   GI/Hepatic Neg liver ROS, GERD  Controlled and Medicated,  Endo/Other  negative endocrine ROS  Renal/GU negative Renal ROS     Musculoskeletal  (+) Arthritis ,   Abdominal   Peds  Hematology negative hematology ROS (+)   Anesthesia Other Findings   Reproductive/Obstetrics                            Anesthesia Physical Anesthesia Plan  ASA: II  Anesthesia Plan: Regional and Spinal   Post-op Pain Management:    Induction:   Airway Management Planned: Nasal Cannula  Additional Equipment: None  Intra-op Plan:   Post-operative Plan:   Informed Consent: I have reviewed the patients History and Physical, chart, labs and discussed the procedure including the risks, benefits and alternatives for the proposed anesthesia with the patient or authorized representative who has indicated his/her understanding and acceptance.   Dental advisory given  Plan Discussed with: CRNA and Surgeon  Anesthesia Plan Comments:        Anesthesia Quick Evaluation

## 2014-10-14 NOTE — Anesthesia Procedure Notes (Addendum)
Procedure Name: MAC Date/Time: 10/14/2014 7:44 AM Performed by: Jacquiline Doe A Pre-anesthesia Checklist: Patient identified, Timeout performed, Emergency Drugs available, Suction available and Patient being monitored Patient Re-evaluated:Patient Re-evaluated prior to inductionOxygen Delivery Method: Nasal cannula Intubation Type: IV induction Placement Confirmation: positive ETCO2 Dental Injury: Teeth and Oropharynx as per pre-operative assessment    Anesthesia Regional Block:  Femoral nerve block  Pre-Anesthetic Checklist: ,, timeout performed, Correct Patient, Correct Site, Correct Laterality, Correct Procedure, Correct Position, site marked, Risks and benefits discussed,  Surgical consent,  Pre-op evaluation,  At surgeon's request and post-op pain management  Laterality: Lower and Left  Prep: chloraprep       Needles:  Injection technique: Single-shot  Needle Type: Echogenic Stimulator Needle          Additional Needles:  Procedures: ultrasound guided (picture in chart) and nerve stimulator Femoral nerve block  Nerve Stimulator or Paresthesia:  Response: quad, 0.5 mA,   Additional Responses:   Narrative:  Injection made incrementally with aspirations every 5 mL.  Performed by: Personally  Anesthesiologist: MOSER, CHRISTOPHER  Additional Notes: H+P and labs reviewed, risks and benefits discussed with patient, procedure tolerated well without complications   Spinal Patient location during procedure: OR Staffing Anesthesiologist: MOSER, CHRISTOPHER Preanesthetic Checklist Completed: patient identified, surgical consent, pre-op evaluation, timeout performed, IV checked, risks and benefits discussed and monitors and equipment checked Spinal Block Patient position: sitting Prep: site prepped and draped and DuraPrep Patient monitoring: heart rate, cardiac monitor, continuous pulse ox and blood pressure Approach: midline Location: L3-4 Injection technique:  single-shot Needle Needle type: Pencan  Needle gauge: 24 G Needle length: 10 cm Assessment Sensory level: T6

## 2014-10-14 NOTE — Transfer of Care (Signed)
Immediate Anesthesia Transfer of Care Note  Patient: Ashley Cooke  Procedure(s) Performed: Procedure(s): TOTAL KNEE ARTHROPLASTY (Left)  Patient Location: PACU  Anesthesia Type:Spinal and MAC combined with regional for post-op pain  Level of Consciousness: awake, alert , oriented, patient cooperative and responds to stimulation  Airway & Oxygen Therapy: Patient Spontanous Breathing  Post-op Assessment: Report given to RN and Post -op Vital signs reviewed and stable  Post vital signs: Reviewed and stable  Last Vitals:  Filed Vitals:   10/14/14 0547  BP: 159/70  Pulse: 73  Temp: 36.7 C  Resp: 20    Complications: No apparent anesthesia complications

## 2014-10-15 ENCOUNTER — Encounter (HOSPITAL_COMMUNITY): Payer: Self-pay | Admitting: General Practice

## 2014-10-15 LAB — CBC
HEMATOCRIT: 35.9 % — AB (ref 36.0–46.0)
Hemoglobin: 12.2 g/dL (ref 12.0–15.0)
MCH: 31.9 pg (ref 26.0–34.0)
MCHC: 34 g/dL (ref 30.0–36.0)
MCV: 94 fL (ref 78.0–100.0)
PLATELETS: 234 10*3/uL (ref 150–400)
RBC: 3.82 MIL/uL — AB (ref 3.87–5.11)
RDW: 14.2 % (ref 11.5–15.5)
WBC: 7.7 10*3/uL (ref 4.0–10.5)

## 2014-10-15 LAB — BASIC METABOLIC PANEL
ANION GAP: 6 (ref 5–15)
BUN: 13 mg/dL (ref 6–20)
CALCIUM: 9.2 mg/dL (ref 8.9–10.3)
CO2: 28 mmol/L (ref 22–32)
Chloride: 106 mmol/L (ref 101–111)
Creatinine, Ser: 1.06 mg/dL — ABNORMAL HIGH (ref 0.44–1.00)
GFR, EST AFRICAN AMERICAN: 58 mL/min — AB (ref >60)
GFR, EST NON AFRICAN AMERICAN: 50 mL/min — AB (ref >60)
Glucose, Bld: 104 mg/dL — ABNORMAL HIGH (ref 65–99)
Potassium: 4.2 mmol/L (ref 3.5–5.1)
Sodium: 140 mmol/L (ref 135–145)

## 2014-10-15 NOTE — Progress Notes (Signed)
PT Cancellation Note  Patient Details Name: Ashley Cooke MRN: [id number] DOB: May 03, 1940   Cancelled Treatment:    Reason Eval/Treat Not Completed: Pain limiting ability to participate (Pt in too much pain and requested to hold therapy until AM)  Advised pt if pain improves to ask nursing staff to walk with her later in the day. Pt currently in CPM.   Melvern Banker 10/15/2014, 3:34 PM

## 2014-10-15 NOTE — Care Management Note (Signed)
Case Management Note  Patient Details  Name: Danial I Fadeley MRN: [id number] Date of Birth: 03/01/1940  Subjective/Objective: 73 yo F - s/p L TKA              Action/Plan: HHPT   Expected Discharge Date: 10/16/14                 Expected Discharge Plan:  Home w Home Health Services  In-House Referral:     Discharge planning Services  CM Consult  Post Acute Care Choice:    Choice offered to:     DME Arranged:    DME Agency:     HH Arranged:  PT HH Agency:  [facility]  Status of Service:  Completed, signed off  Medicare Important Message Given:   yes Date Medicare IM Given:   10/15/14 Medicare IM give by:   Jeannette Oliveras, RN, BSN Date Additional Medicare IM Given:    Additional Medicare Important Message give by:     If discussed at Long Length of Stay Meetings, dates discussed:    Additional Comments: met with pt and husband to discuss the D/C plan and HHPT. Pt plans to return home with the support of her husband. [employer] delivered the CPM, RW, and 3-N-1 BSC. The physician's office arranged the HHPT with [facility] and pt agrees with agency.  Oliveras-Aizpurua, Jeannette, RN 10/15/2014, 10:56 AM  

## 2014-10-15 NOTE — Progress Notes (Signed)
OT Cancellation Note  Patient Details Name: Ashley Cooke MRN: [id number] DOB: 03-Dec-1940   Cancelled Treatment:    Reason Eval/Treat Not Completed: Other (comment). Pt just back to bed with RN, in extreme pain and not due for pain meds for an hour. Will try back later today or tomorrow as schedule allows.  Almon Register [phone] 10/15/2014, 12:58 PM

## 2014-10-15 NOTE — Progress Notes (Signed)
Orthopaedic Trauma Service (OTS)  Subjective: 1 Day Post-Op Procedure(s) (LRB): TOTAL KNEE ARTHROPLASTY (Left) Patient reports pain as moderate.   Worked well with PT today.  Objective: Current Vitals Blood pressure 115/54, pulse 67, temperature 98.3 F (36.8 C), temperature source Oral, resp. rate 17, weight 168 lb (76.204 kg), last menstrual period 01/21/1977, SpO2 97 %. Vital signs in last 24 hours: Temp:  [97.7 F (36.5 C)-98.9 F (37.2 C)] 98.3 F (36.8 C) (09/24 0325) Pulse Rate:  [67-86] 67 (09/24 0325) Resp:  [17-18] 17 (09/24 0325) BP: (114-137)/(54-78) 115/54 mmHg (09/24 0325) SpO2:  [96 %-99 %] 97 % (09/24 0325)  Intake/Output from previous day: 09/23 0701 - 09/24 0700 In: 2175 [I.V.:2175] Out: 950 [Urine:550; Drains:400]  LABS  Recent Labs  10/15/14 0539  HGB 12.2    Recent Labs  10/15/14 0539  WBC 7.7  RBC 3.82*  HCT 35.9*  PLT 234    Recent Labs  10/15/14 0539  NA 140  K 4.2  CL 106  CO2 28  BUN 13  CREATININE 1.06*  GLUCOSE 104*  CALCIUM 9.2   No results for input(s): LABPT, INR in the last 72 hours.  Physical Exam LLE Dressing intact, clean, dry  Edema/ swelling controlled  Sens: DPN, SPN, TN intact  Motor: EHL, FHL, and lessor toe ext and flex all intact grossly  Brisk cap refill, warm to touch  Vac holding  Imaging No results found.  Assessment/Plan: 1 Day Post-Op Procedure(s) (LRB): TOTAL KNEE ARTHROPLASTY (Left) Eliquis WBAT PT, OT D/c tomorrow  Altamese [place], MD [facility] [phone] (p)   10/15/2014, 11:21 AM

## 2014-10-15 NOTE — Progress Notes (Signed)
Physical Therapy Treatment Patient Details Name: Ashley Cooke MRN: [id number] DOB: 1940/06/13 Today's Date: 10/15/2014    History of Present Illness Patient is a 74 y/o female s/p L TKA. PMH includes HTN and cancer.    PT Comments    Pt making excellent progress with mobility. Will practice a stair with pt this pm.    Follow Up Recommendations  Home health PT;Supervision for mobility/OOB     Equipment Recommendations  None recommended by PT    Recommendations for Other Services       Precautions / Restrictions Precautions Precautions: Fall;Knee Precaution Comments: Reviewed no pillow under knee and precautions. Required Braces or Orthoses: Knee Immobilizer - Left Knee Immobilizer - Left: On when out of bed or walking Restrictions Weight Bearing Restrictions: Yes LLE Weight Bearing: Weight bearing as tolerated    Mobility  Bed Mobility Overal bed mobility: Needs Assistance Bed Mobility: Supine to Sit     Supine to sit: Supervision;HOB elevated        Transfers Overall transfer level: Needs assistance Equipment used: Rolling walker (2 wheeled) Transfers: Sit to/from Stand Sit to Stand: Min guard         General transfer comment: cues for safest technique  Ambulation/Gait Ambulation/Gait assistance: Min guard Ambulation Distance (Feet): 60 Feet Assistive device: Rolling walker (2 wheeled) Gait Pattern/deviations: Step-to pattern Gait velocity: slower than norms   General Gait Details: no loss of balance.  Able t take several steps backwards to back up to commode   Stairs            Wheelchair Mobility    Modified Rankin (Stroke Patients Only)       Balance                                    Cognition Arousal/Alertness: Awake/alert Behavior During Therapy: WFL for tasks assessed/performed;Impulsive Overall Cognitive Status: Within Functional Limits for tasks assessed                      Exercises Total Joint  Exercises Ankle Circles/Pumps: AROM;Both;5 reps;Supine Quad Sets: AROM;Both;5 reps;Supine Short Arc Quad: AROM;Left;10 reps;Supine Heel Slides: AAROM;Left;10 reps;Supine Hip ABduction/ADduction: AROM;Left;10 reps;Supine Straight Leg Raises: AAROM;Left;10 reps;Supine Long Arc Quad: AROM;Left;10 reps;Seated Knee Flexion: AAROM;Left;5 reps;Seated Goniometric ROM: 92 degrees flexion. Appears to have full knee extension but difficult to assess with bulky bandage    General Comments General comments (skin integrity, edema, etc.): husband present for session.  Both pleased with pts abilities today      Pertinent Vitals/Pain Pain Assessment: 0-10 Pain Score: 6  Pain Location: left knee Pain Intervention(s): Monitored during session;Premedicated before session    Home Living                      Prior Function            PT Goals (current goals can now be found in the care plan section) Acute Rehab PT Goals Patient Stated Goal: to get back to tennis Progress towards PT goals: Progressing toward goals    Frequency  7X/week    PT Plan Current plan remains appropriate    Co-evaluation             End of Session Equipment Utilized During Treatment: Gait belt;Left knee immobilizer Activity Tolerance: Patient tolerated treatment well Patient left: Other (comment);with family/visitor present (in bathroom. Pt/husband know to call for  assist when done)     Time: 0921-1002 PT Time Calculation (min) (ACUTE ONLY): 41 min  Charges:  $Gait Training: 23-37 mins $Therapeutic Exercise: 8-22 mins                    G Codes:      Melvern Banker 10/31/14, 10:10 AM  Lavonia Dana, PT  [phone] 2014/10/31

## 2014-10-16 LAB — CBC
HCT: 32.3 % — ABNORMAL LOW (ref 36.0–46.0)
HEMOGLOBIN: 11 g/dL — AB (ref 12.0–15.0)
MCH: 32 pg (ref 26.0–34.0)
MCHC: 34.1 g/dL (ref 30.0–36.0)
MCV: 93.9 fL (ref 78.0–100.0)
PLATELETS: 214 10*3/uL (ref 150–400)
RBC: 3.44 MIL/uL — AB (ref 3.87–5.11)
RDW: 14 % (ref 11.5–15.5)
WBC: 10.1 10*3/uL (ref 4.0–10.5)

## 2014-10-16 NOTE — Progress Notes (Signed)
Physical Therapy Treatment Patient Details Name: Ashley Cooke MRN: [id number] DOB: [birth year] Today's Date: 10/16/2014    History of Present Illness Patient is a 74 y/o female s/p L TKA. PMH includes HTN and cancer.    PT Comments    Pt moving well.  Ambulated room>ortho gym & completed stair training.  Patient safe to D/C from a mobility standpoint based on progression towards goals set on PT eval.    Follow Up Recommendations  Home health PT;Supervision for mobility/OOB     Equipment Recommendations  None recommended by PT    Recommendations for Other Services       Precautions / Restrictions Precautions Precautions: Fall;Knee Precaution Comments: Reviewed no pillow under knee and precautions. Required Braces or Orthoses: Knee Immobilizer - Left Knee Immobilizer - Left: On when out of bed or walking Restrictions Weight Bearing Restrictions: Yes LLE Weight Bearing: Weight bearing as tolerated    Mobility  Bed Mobility Overal bed mobility: Needs Assistance Bed Mobility: Sit to Supine       Sit to supine: Min assist   General bed mobility comments: (A) for LLE  Transfers Overall transfer level: Needs assistance Equipment used: Rolling walker (2 wheeled) Transfers: Sit to/from Stand Sit to Stand: Min guard         General transfer comment: cues for safest technique  Ambulation/Gait Ambulation/Gait assistance: Min guard Ambulation Distance (Feet): 100 Feet Assistive device: Rolling walker (2 wheeled) Gait Pattern/deviations: Step-through pattern;Antalgic;Decreased weight shift to left     General Gait Details: Cues for safe body positioning with RW, increased WBing LLE to allow smoother step with RLE.     Stairs Stairs: Yes Stairs assistance: Min guard Stair Management: No rails;With walker;Forwards;Step to pattern Number of Stairs: 1 (2x's) General stair comments: cues for technique  Wheelchair Mobility    Modified Rankin (Stroke Patients  Only)       Balance                                    Cognition Arousal/Alertness: Awake/alert Behavior During Therapy: WFL for tasks assessed/performed;Impulsive Overall Cognitive Status: Within Functional Limits for tasks assessed                      Exercises      General Comments        Pertinent Vitals/Pain Pain Assessment: 0-10 Pain Score: 8  (with WBing) Pain Location: Lt knee Pain Intervention(s): Premedicated before session;Repositioned;Monitored during session    Home Living                      Prior Function            PT Goals (current goals can now be found in the care plan section) Acute Rehab PT Goals Patient Stated Goal: to get back to tennis PT Goal Formulation: With patient Time For Goal Achievement: 10/28/14 Potential to Achieve Goals: Good Progress towards PT goals: Progressing toward goals    Frequency  7X/week    PT Plan Current plan remains appropriate    Co-evaluation             End of Session Equipment Utilized During Treatment: Gait belt;Left knee immobilizer Activity Tolerance: Patient tolerated treatment well Patient left: in bed;in CPM;with family/visitor present (CPM: 0-55 )     Time: 3875-6433 PT Time Calculation (min) (ACUTE ONLY): 26 min  Charges:  $Gait  Training: 23-37 mins                    G CodesSarajane Marek, [place] [phone] 10/16/2014

## 2014-10-16 NOTE — Progress Notes (Signed)
Encounter reviewed by Dr. Brook Amundson C. Silva.  

## 2014-10-16 NOTE — Progress Notes (Signed)
Orthopaedic Trauma Service (OTS)  Subjective: 2 Days Post-Op Procedure(s) (LRB): TOTAL KNEE ARTHROPLASTY (Left) Patient reports pain as mild.   Did well with PT and eager for d/c to home.  Objective: Current Vitals Blood pressure 112/86, pulse 92, temperature 100.7 F (38.2 C), temperature source Oral, resp. rate 15, weight 168 lb (76.204 kg), last menstrual period 01/21/1977, SpO2 94 %. Vital signs in last 24 hours: Temp:  [98.4 F (36.9 C)-100.7 F (38.2 C)] 100.7 F (38.2 C) (09/25 0607) Pulse Rate:  [72-92] 92 (09/25 0607) Resp:  [15-17] 15 (09/25 0607) BP: (112-122)/(56-86) 112/86 mmHg (09/25 0607) SpO2:  [94 %-97 %] 94 % (09/25 0607)  Intake/Output from previous day: 09/24 0701 - 09/25 0700 In: 600 [P.O.:600] Out: 200 [Drains:200]  LABS  Recent Labs  10/15/14 0539 10/16/14 0503  HGB 12.2 11.0*    Recent Labs  10/15/14 0539 10/16/14 0503  WBC 7.7 10.1  RBC 3.82* 3.44*  HCT 35.9* 32.3*  PLT 234 214    Recent Labs  10/15/14 0539  NA 140  K 4.2  CL 106  CO2 28  BUN 13  CREATININE 1.06*  GLUCOSE 104*  CALCIUM 9.2   No results for input(s): LABPT, INR in the last 72 hours.   Physical Exam LLEWound clean, dry, and intact; hemovac d/c'd and dressing changed Edema/ swelling controlled Sens: DPN, SPN, TN intact Motor: EHL, FHL, and lessor toe ext and flex all intact grossly Brisk cap refill, warm to touch           Assessment/Plan: 2 Day Post-Op Procedure(s) (LRB): TOTAL KNEE ARTHROPLASTY (Left) Eliquis WBAT PT, OT D/c to home  Altamese Newbern, MD [facility] [phone] (p)      10/16/2014, 11:52 AM

## 2014-10-16 NOTE — Evaluation (Signed)
Occupational Therapy Evaluation and Discharge Patient Details Name: Ashley Cooke MRN: [id number] DOB: 1940-12-13 Today's Date: 10/16/2014    History of Present Illness Patient is a 74 y/o female s/p L TKA. PMH includes HTN and cancer.   Clinical Impression   This 73 yo female admitted and underwent above presents to acute OT with all education on BADLs completed and she will have A prn at home as well. Acute OT will sign off.    Follow Up Recommendations  No OT follow up    Equipment Recommendations  None recommended by OT       Precautions / Restrictions Precautions Precautions: Fall;Knee Precaution Comments: Reviewed no pillow under knee and precautions. Required Braces or Orthoses: Knee Immobilizer - Left Knee Immobilizer - Left: On when out of bed or walking Restrictions Weight Bearing Restrictions: Yes LLE Weight Bearing: Weight bearing as tolerated      Mobility Bed Mobility Overal bed mobility: Needs Assistance Bed Mobility: Supine to Sit     Supine to sit: Min assist (for LLE) Sit to supine: Min assist   General bed mobility comments: (A) for LLE  Transfers Overall transfer level: Needs assistance Equipment used: Rolling walker (2 wheeled) Transfers: Sit to/from Stand Sit to Stand: Supervision                 ADL Overall ADL's : Needs assistance/impaired Eating/Feeding: Independent;Sitting   Grooming: Wash/dry hands;Wash/dry face;Oral care;Set up;Standing   Upper Body Bathing: Set up;Sitting   Lower Body Bathing: Set up;Supervison/ safety;Sit to/from stand   Upper Body Dressing : Set up;Sitting   Lower Body Dressing: Set up;Supervision/safety;Sit to/from stand   Toilet Transfer: Supervision/safety;Ambulation;RW;Comfort height toilet;Grab bars   Toileting- Clothing Manipulation and Hygiene: Supervision/safety;Sit to/from Nurse, children's Details (indicate cue type and reason): I went over with pt how she would step into and  our of her shower stall ("in with the good out with the bad") and she simulated it in her room   I went over the most efficient way for her to get dressed including what leg to start with.                   Pertinent Vitals/Pain Pain Assessment: 0-10 Pain Score: 4  Pain Location: left knee Pain Descriptors / Indicators: Aching;Sore Pain Intervention(s): Premedicated before session;Repositioned;Monitored during session     Hand Dominance Right   Extremity/Trunk Assessment Upper Extremity Assessment Upper Extremity Assessment: Overall WFL for tasks assessed           Communication Communication Communication: No difficulties   Cognition Arousal/Alertness: Awake/alert Behavior During Therapy: WFL for tasks assessed/performed Overall Cognitive Status: Within Functional Limits for tasks assessed                                Home Living Family/patient expects to be discharged to:: Private residence Living Arrangements: Spouse/significant other Available Help at Discharge: Family;Available 24 hours/day Type of Home: House Home Access: Stairs to enter [employer] of Steps: 1 Entrance Stairs-Rails: None Home Layout: One level     Bathroom Shower/Tub: Tub/shower unit;Door Shower/tub characteristics: Door Biochemist, clinical: Standard     Home Equipment: Environmental consultant - 2 wheels;Bedside commode;Shower seat          Prior Functioning/Environment Level of Independence: Independent        Comments: Pt very active- plays tennis, does tai chi, goes to the gym and swims.  Was skiing last winter.    OT Diagnosis: Generalized weakness;Acute pain         OT Goals(Current goals can be found in the care plan section) Acute Rehab OT Goals Patient Stated Goal: home today  OT Frequency:                End of Session Equipment Utilized During Treatment: Gait belt;Rolling walker;Left knee immobilizer  Activity Tolerance: Patient tolerated treatment  well Patient left: in chair;with call bell/phone within reach;with family/visitor present   Time: 0825-0855 OT Time Calculation (min): 30 min Charges:  OT General Charges $OT Visit: 1 Procedure OT Evaluation $Initial OT Evaluation Tier I: 1 Procedure OT Treatments $Self Care/Home Management : 8-22 mins    Almon Register [phone] 10/16/2014, 12:33 PM

## 2014-10-17 ENCOUNTER — Encounter (HOSPITAL_COMMUNITY): Payer: Self-pay | Admitting: Orthopedic Surgery

## 2014-10-26 NOTE — Discharge Summary (Signed)
PATIENT ID: Ashley Cooke        MRN:  [id number]          DOB/AGE: [birth year] / 74 y.o.    DISCHARGE SUMMARY  ADMISSION DATE:    10/14/2014 DISCHARGE DATE:   10/16/14  ADMISSION DIAGNOSIS: djd left knee    DISCHARGE DIAGNOSIS:  djd left knee    ADDITIONAL DIAGNOSIS: Active Problems:   Primary localized osteoarthritis of left knee  Past Medical History  Diagnosis Date  . Hypertension   . Melanoma of lower leg 05/2003    "left"  . Squamous carcinoma     "all on the left side:  under eye, nostril, bicept"  . Seasonal allergies   . Hypercholesterolemia   . Arthritis     "primarily in my knee & thumbs" (10/15/2014)    PROCEDURE: Procedure(s): TOTAL KNEE ARTHROPLASTY Left on 10/14/2014  CONSULTS: PT/OT      HISTORY:  See H&P in chart  HOSPITAL COURSE:  Ashley Cooke is a 74 y.o. admitted on 10/14/2014 and found to have a diagnosis of djd left knee.  After appropriate laboratory studies were obtained  they were taken to the operating room on 10/14/2014 and underwent  Procedure(s): TOTAL KNEE ARTHROPLASTY  Left.   They were given perioperative antibiotics:  Anti-infectives    Start     Dose/Rate Route Frequency Ordered Stop   10/14/14 1400  ceFAZolin (ANCEF) IVPB 1 g/50 mL premix     1 g 100 mL/hr over 30 Minutes Intravenous Every 6 hours 10/14/14 1155 10/14/14 2040   10/14/14 0540  ceFAZolin (ANCEF) IVPB 2 g/50 mL premix  Status:  Discontinued     2 g 100 mL/hr over 30 Minutes Intravenous On call to O.R. 10/14/14 0540 10/14/14 1132   10/14/14 0531  ceFAZolin (ANCEF) 2-3 GM-% IVPB SOLR    Comments:  Sharmaine Base   : cabinet override      10/14/14 0531 10/14/14 0810    .  Tolerated the procedure well.   POD #1, allowed out of bed to a chair.  PT for ambulation and exercise program.  IV saline locked.  O2 discontionued.  POD #2, continued PT and ambulation.   Hemovac pulled.  The remainder of the hospital course was dedicated to ambulation and strengthening.   The  patient was discharged on 2 days post op in  Stable condition.  Blood products given:none  DIAGNOSTIC STUDIES: Recent vital signs: No data found.      Recent laboratory studies: No results for input(s): WBC, HGB, HCT, PLT in the last 168 hours. No results for input(s): NA, K, CL, CO2, BUN, CREATININE, GLUCOSE, CALCIUM in the last 168 hours. Lab Results  Component Value Date   INR 1.10 10/03/2014     Recent Radiographic Studies :  Dg Chest 2 View  10/03/2014   CLINICAL DATA:  Osteoarthritis of the knee. Allergy. Remote smoker.  EXAM: CHEST  2 VIEW  COMPARISON:  11/13/2007 chest CT.  05/21/2005 chest x-ray  FINDINGS: Normal heart size and mediastinal contours. A subtle nodular density at the peripheral left base has a stable appearance, also seen by chest CT in 2009. No infiltrate or edema. No effusion or pneumothorax. No acute osseous findings.  IMPRESSION: No acute finding or change from 2007.   Electronically Signed   By: Monte Fantasia M.D.   On: 10/03/2014 13:32   Mm Digital Screening Bilateral  10/05/2014   CLINICAL DATA:  Screening.  EXAM: DIGITAL SCREENING BILATERAL  MAMMOGRAM WITH CAD  COMPARISON:  Previous exam(s).  ACR Breast Density Category b: There are scattered areas of fibroglandular density.  FINDINGS: There are no findings suspicious for malignancy. Images were processed with CAD.  IMPRESSION: No mammographic evidence of malignancy. A result letter of this screening mammogram will be mailed directly to the patient.  RECOMMENDATION: Screening mammogram in one year. (Code:SM-B-01Y)  BI-RADS CATEGORY  1: Negative.   Electronically Signed   By: Conchita Paris M.D.   On: 10/05/2014 16:58    DISCHARGE INSTRUCTIONS:   DISCHARGE MEDICATIONS:     Medication List    TAKE these medications        acetaminophen 500 MG tablet  Commonly known as:  TYLENOL  Take 1,000 mg by mouth every 6 (six) hours as needed.     apixaban 2.5 MG Tabs tablet  Commonly known as:  ELIQUIS  Take  1 tablet (2.5 mg total) by mouth 2 (two) times daily.     atorvastatin 10 MG tablet  Commonly known as:  LIPITOR  Take 1 tablet by mouth daily.     CALCIUM 600 600 MG Tabs tablet  Generic drug:  calcium carbonate  Take 600 mg by mouth daily.     cholecalciferol 1000 UNITS tablet  Commonly known as:  VITAMIN D  Take 1,000 Units by mouth daily.     famotidine 20 MG tablet  Commonly known as:  PEPCID  Take 20 mg by mouth 2 (two) times daily.     loratadine 10 MG tablet  Commonly known as:  CLARITIN  Take 10 mg by mouth daily as needed for allergies.     losartan 100 MG tablet  Commonly known as:  COZAAR  Take 50 mg by mouth Daily.     multivitamin tablet  Take 1 tablet by mouth daily.     naproxen sodium 220 MG tablet  Commonly known as:  ANAPROX  Take 440 mg by mouth 2 (two) times daily with a meal.     oxyCODONE 5 MG immediate release tablet  Commonly known as:  Oxy IR/ROXICODONE  1-2 tabs po q4-6hrs prn pain     vitamin C 500 MG tablet  Commonly known as:  ASCORBIC ACID  Take 500 mg by mouth daily.        FOLLOW UP VISIT:       Follow-up Information    Follow up with CAFFREY JR,W D, MD. Schedule an appointment as soon as possible for a visit in 2 weeks.   Specialty:  Orthopedic Surgery   Contact information:   [address] [place] [address] [phone]       DISPOSITION:   Home  CONDITION:  Stable   Ashley Dibble, MD  10/26/2014 2:58 PM

## 2015-01-24 ENCOUNTER — Ambulatory Visit: Payer: Self-pay | Admitting: Physician Assistant

## 2015-01-24 NOTE — H&P (Signed)
TOTAL KNEE ADMISSION H&P  Patient is being admitted for right total knee arthroplasty.  Subjective:  Chief Complaint:right knee pain.  HPI: Ashley Cooke, 75 y.o. female, has a history of pain and functional disability in the right knee due to arthritis and has failed non-surgical conservative treatments for greater than 12 weeks to includeNSAID's and/or analgesics, corticosteriod injections, viscosupplementation injections, use of assistive devices and activity modification.  Onset of symptoms was gradual, starting 7 years ago with gradually worsening course since that time. The patient noted no past surgery on the right knee(s).  Patient currently rates pain in the right knee(s) at 8 out of 10 with activity. Patient has night pain, worsening of pain with activity and weight bearing, pain that interferes with activities of daily living, pain with passive range of motion, crepitus and joint swelling.  Patient has evidence of periarticular osteophytes and joint space narrowing by imaging studies.There is no active infection.  Patient Active Problem List   Diagnosis Date Noted  . Primary localized osteoarthritis of left knee 10/14/2014  . Pure hypercholesterolemia 10/10/2013  . Postmenopausal hormone replacement therapy 10/10/2013  . Essential hypertension 10/10/2013  . Arthritis 10/10/2013   Past Medical History  Diagnosis Date  . Hypertension   . Melanoma of lower leg 05/2003    "left"  . Squamous carcinoma     "all on the left side:  under eye, nostril, bicept"  . Seasonal allergies   . Hypercholesterolemia   . Arthritis     "primarily in my knee & thumbs" (10/15/2014)    Past Surgical History  Procedure Laterality Date  . Melanoma excision Left 05/2003    leg  . Knee arthroscopy Left 05/06/2012    torn miniscus  . Tonsillectomy and adenoidectomy  1949  . Joint replacement    . Total knee arthroplasty Left 10/14/2014  . Squamous cell carcinoma excision Left 2008    upper bicept  .  Mohs surgery Left 2000's    "squamous; under eye and nostril"  . Dilation and curettage of uterus  1968  . Total knee arthroplasty Left 10/14/2014    Procedure: TOTAL KNEE ARTHROPLASTY;  Surgeon: Earlie Server, MD;  Location: [facility];  Service: Orthopedics;  Laterality: Left;     (Not in a hospital admission) No Known Allergies  Social History  Substance Use Topics  . Smoking status: Former Smoker -- 0.50 packs/day for 22 years    Types: Cigarettes    Quit date: 06/22/1979  . Smokeless tobacco: Never Used  . Alcohol Use: 4.2 oz/week    7 Glasses of wine per week    Family History  Problem Relation Age of Onset  . Colon cancer Neg Hx   . Hypertension Mother   . Hypertension Sister      Review of Systems  Musculoskeletal: Positive for joint pain.  All other systems reviewed and are negative.   Objective:  Physical Exam  Constitutional: She is oriented to person, place, and time. She appears well-developed and well-nourished. No distress.  HENT:  Head: Normocephalic and atraumatic.  Eyes: Conjunctivae and EOM are normal. Pupils are equal, round, and reactive to light.  Neck: Normal range of motion. Neck supple.  Cardiovascular: Normal rate, regular rhythm, normal heart sounds and intact distal pulses.   Respiratory: Effort normal and breath sounds normal. No respiratory distress. She has no wheezes.  GI: Soft. Bowel sounds are normal. She exhibits no distension. There is no tenderness.  Musculoskeletal:       Right  knee: She exhibits no erythema, no LCL laxity and no MCL laxity. Tenderness found. Medial joint line tenderness noted.  Lymphadenopathy:    She has no cervical adenopathy.  Neurological: She is alert and oriented to person, place, and time. No cranial nerve deficit.  Skin: Skin is warm and dry. No rash noted. No erythema.  Psychiatric: She has a normal mood and affect. Her behavior is normal.    Vital signs in last 24 hours: @VSRANGES @  Labs:   Estimated  body mass index is 29.99 kg/(m^2) as calculated from the following:   Height as of 10/12/14: 5' 2.75" (1.594 m).   Weight as of 10/12/14: 76.204 kg (168 lb).   Imaging Review Plain radiographs demonstrate severe degenerative joint disease of the right knee(s). The overall alignment issignificant varus. The bone quality appears to be good for age and reported activity level.  Assessment/Plan:  End stage arthritis, right knee   The patient history, physical examination, clinical judgment of the provider and imaging studies are consistent with end stage degenerative joint disease of the right knee(s) and total knee arthroplasty is deemed medically necessary. The treatment options including medical management, injection therapy arthroscopy and arthroplasty were discussed at length. The risks and benefits of total knee arthroplasty were presented and reviewed. The risks due to aseptic loosening, infection, stiffness, patella tracking problems, thromboembolic complications and other imponderables were discussed. The patient acknowledged the explanation, agreed to proceed with the plan and consent was signed. Patient is being admitted for inpatient treatment for surgery, pain control, PT, OT, prophylactic antibiotics, VTE prophylaxis, progressive ambulation and ADL's and discharge planning. The patient is planning to be discharged home with home health services

## 2015-01-31 ENCOUNTER — Encounter (HOSPITAL_COMMUNITY)
Admission: RE | Admit: 2015-01-31 | Discharge: 2015-01-31 | Disposition: A | Discharge status: Home / Self Care | Payer: Medicare Other | Source: Ambulatory Visit | Attending: Orthopedic Surgery | Admitting: Orthopedic Surgery

## 2015-01-31 ENCOUNTER — Other Ambulatory Visit (HOSPITAL_COMMUNITY): Payer: Self-pay | Admitting: *Deleted

## 2015-01-31 ENCOUNTER — Encounter (HOSPITAL_COMMUNITY): Payer: Self-pay

## 2015-01-31 DIAGNOSIS — Z0183 Encounter for blood typing: Secondary | ICD-10-CM | POA: Insufficient documentation

## 2015-01-31 DIAGNOSIS — M1711 Unilateral primary osteoarthritis, right knee: Secondary | ICD-10-CM | POA: Insufficient documentation

## 2015-01-31 DIAGNOSIS — Z01812 Encounter for preprocedural laboratory examination: Secondary | ICD-10-CM | POA: Insufficient documentation

## 2015-01-31 LAB — COMPREHENSIVE METABOLIC PANEL
ALBUMIN: 4.4 g/dL (ref 3.5–5.0)
ALK PHOS: 73 U/L (ref 38–126)
ALT: 22 U/L (ref 14–54)
ANION GAP: 12 (ref 5–15)
AST: 26 U/L (ref 15–41)
BILIRUBIN TOTAL: 0.7 mg/dL (ref 0.3–1.2)
BUN: 25 mg/dL — ABNORMAL HIGH (ref 6–20)
CALCIUM: 9.9 mg/dL (ref 8.9–10.3)
CO2: 25 mmol/L (ref 22–32)
CREATININE: 1.04 mg/dL — AB (ref 0.44–1.00)
Chloride: 103 mmol/L (ref 101–111)
GFR, EST AFRICAN AMERICAN: 59 mL/min — AB (ref >60)
GFR, EST NON AFRICAN AMERICAN: 51 mL/min — AB (ref >60)
Glucose, Bld: 99 mg/dL (ref 65–99)
Potassium: 4.3 mmol/L (ref 3.5–5.1)
SODIUM: 140 mmol/L (ref 135–145)
TOTAL PROTEIN: 7.1 g/dL (ref 6.5–8.1)

## 2015-01-31 LAB — PROTIME-INR
INR: 1.13 (ref 0.00–1.49)
PROTHROMBIN TIME: 14.7 s (ref 11.6–15.2)

## 2015-01-31 LAB — URINE MICROSCOPIC-ADD ON

## 2015-01-31 LAB — CBC WITH DIFFERENTIAL/PLATELET
BASOS PCT: 0 %
Basophils Absolute: 0 10*3/uL (ref 0.0–0.1)
EOS ABS: 0.2 10*3/uL (ref 0.0–0.7)
EOS PCT: 3 %
HCT: 42.8 % (ref 36.0–46.0)
HEMOGLOBIN: 14.1 g/dL (ref 12.0–15.0)
Lymphocytes Relative: 30 %
Lymphs Abs: 2.2 10*3/uL (ref 0.7–4.0)
MCH: 30.7 pg (ref 26.0–34.0)
MCHC: 32.9 g/dL (ref 30.0–36.0)
MCV: 93.2 fL (ref 78.0–100.0)
MONOS PCT: 7 %
Monocytes Absolute: 0.5 10*3/uL (ref 0.1–1.0)
NEUTROS PCT: 60 %
Neutro Abs: 4.3 10*3/uL (ref 1.7–7.7)
PLATELETS: 298 10*3/uL (ref 150–400)
RBC: 4.59 MIL/uL (ref 3.87–5.11)
RDW: 15.6 % — ABNORMAL HIGH (ref 11.5–15.5)
WBC: 7.3 10*3/uL (ref 4.0–10.5)

## 2015-01-31 LAB — URINALYSIS, ROUTINE W REFLEX MICROSCOPIC
Bilirubin Urine: NEGATIVE
GLUCOSE, UA: NEGATIVE mg/dL
Hgb urine dipstick: NEGATIVE
Ketones, ur: NEGATIVE mg/dL
Nitrite: NEGATIVE
PH: 6 (ref 5.0–8.0)
PROTEIN: NEGATIVE mg/dL
SPECIFIC GRAVITY, URINE: 1.018 (ref 1.005–1.030)

## 2015-01-31 LAB — TYPE AND SCREEN
ABO/RH(D): O POS
Antibody Screen: NEGATIVE

## 2015-01-31 LAB — SURGICAL PCR SCREEN
MRSA, PCR: NEGATIVE
Staphylococcus aureus: NEGATIVE

## 2015-01-31 LAB — APTT: aPTT: 28 seconds (ref 24–37)

## 2015-01-31 NOTE — Pre-Procedure Instructions (Addendum)
Ashley Cooke  01/31/2015      [id number] - [place] [place] [address] [place] [address] Phone: [phone] Fax: [phone]  [id number] Sherilyn Cooter, MD - [facility] [id number] [place] [address] Phone: [phone] Fax: [phone]    Your procedure is scheduled on Friday, February 10, 2015    Report to [facility] Entrance "A" Admitting Office at 5:30 AM.   Call this number if you have problems the morning of surgery: [phone]   Any questions prior to day of surgery, please call [phone] between 8 & 4 PM.   Remember:  Do not eat food or drink liquids after midnight Thursday, 02/09/15.  Take these medicines the morning of surgery with A SIP OF WATER: Tylenol - if needed, Famotidine (Pepcid) - if needed  Stop NSAIDS (Naprosyn, Aleve, Ibuprofen, etc.) and Multivitamins, calcium,vit D,vit C,aspirin 5 days starting 02/05/15 prior to surgery.   Do not wear jewelry, make-up or nail polish.  Do not wear lotions, powders, or perfumes.  You may wear deodorant.  Do not shave 48 hours prior to surgery.    Do not bring valuables to the hospital.  [facility] is not responsible for any belongings or valuables.  Contacts, dentures or bridgework may not be worn into surgery.  Leave your suitcase in the car.  After surgery it may be brought to your room.  For patients admitted to the hospital, discharge time will be determined by your treatment team.  Special instructions:  [facility] - Preparing for Surgery  Before surgery, you can play an important role.  Because skin is not sterile, your skin needs to be as free of germs as possible.  You can reduce the number of germs on you skin by washing with CHG (chlorahexidine gluconate) soap before surgery.  CHG is an antiseptic cleaner which kills germs and bonds with the skin to continue killing germs  even after washing.  Please DO NOT use if you have an allergy to CHG or antibacterial soaps.  If your skin becomes reddened/irritated stop using the CHG and inform your nurse when you arrive at Short Stay.  Do not shave (including legs and underarms) for at least 48 hours prior to the first CHG shower.  You may shave your face.  Please follow these instructions carefully:   1.  Shower with CHG Soap the night before surgery and the                                morning of Surgery.  2.  If you choose to wash your hair, wash your hair first as usual with your       normal shampoo.  3.  After you shampoo, rinse your hair and body thoroughly to remove the                      Shampoo.  4.  Use CHG as you would any other liquid soap.  You can apply chg directly       to the skin and wash gently with scrungie or a clean washcloth.  5.  Apply the CHG Soap to your body ONLY FROM THE NECK DOWN.        Do not use on open wounds or open sores.  Avoid contact  with your eyes, ears, mouth and genitals (private parts).  Wash genitals (private parts) with your normal soap.  6.  Wash thoroughly, paying special attention to the area where your surgery        will be performed.  7.  Thoroughly rinse your body with warm water from the neck down.  8.  DO NOT shower/wash with your normal soap after using and rinsing off       the CHG Soap.  9.  Pat yourself dry with a clean towel.            10.  Wear clean pajamas.            11.  Place clean sheets on your bed the night of your first shower and do not        sleep with pets.  Day of Surgery  Do not apply any lotions the morning of surgery.  Please wear clean clothes to the hospital.   Please read over the following fact sheets that you were given. Pain Booklet, Coughing and Deep Breathing, Blood Transfusion Information, MRSA Information and Surgical Site Infection Prevention

## 2015-02-01 LAB — URINE CULTURE

## 2015-02-09 MED ORDER — SODIUM CHLORIDE 0.9 % IV SOLN
INTRAVENOUS | Status: DC
Start: 2015-02-09 — End: 2015-02-10

## 2015-02-09 MED ORDER — SODIUM CHLORIDE 0.9 % IV SOLN
1000.0000 mg | INTRAVENOUS | Status: AC
  Administered 2015-02-10: 1000 mg via INTRAVENOUS
  Filled 2015-02-09: qty 10

## 2015-02-09 MED ORDER — CHLORHEXIDINE GLUCONATE 4 % EX LIQD: 60.0000 mL | Freq: Once | CUTANEOUS | Status: DC

## 2015-02-09 MED ORDER — CEFAZOLIN SODIUM-DEXTROSE 2-3 GM-% IV SOLR
2.0000 g | INTRAVENOUS | Status: AC
  Administered 2015-02-10: 2 g via INTRAVENOUS
  Filled 2015-02-09: qty 50

## 2015-02-10 ENCOUNTER — Inpatient Hospital Stay (HOSPITAL_COMMUNITY): Payer: Medicare Other | Admitting: Anesthesiology

## 2015-02-10 ENCOUNTER — Encounter (HOSPITAL_COMMUNITY): Payer: Self-pay | Admitting: General Practice

## 2015-02-10 ENCOUNTER — Encounter (HOSPITAL_COMMUNITY)
Admission: RE | Disposition: A | Discharge status: Home Health | Payer: Self-pay | Source: Ambulatory Visit | Attending: Orthopedic Surgery

## 2015-02-10 ENCOUNTER — Inpatient Hospital Stay (HOSPITAL_COMMUNITY)
Admission: RE | Admit: 2015-02-10 | Discharge: 2015-02-12 | DRG: 470 | Disposition: A | Discharge status: Home Health | Payer: Medicare Other | Source: Ambulatory Visit | Attending: Orthopedic Surgery | Admitting: Orthopedic Surgery

## 2015-02-10 DIAGNOSIS — Z85820 Personal history of malignant melanoma of skin: Secondary | ICD-10-CM

## 2015-02-10 DIAGNOSIS — M21161 Varus deformity, not elsewhere classified, right knee: Secondary | ICD-10-CM | POA: Diagnosis present

## 2015-02-10 DIAGNOSIS — Z79890 Hormone replacement therapy (postmenopausal): Secondary | ICD-10-CM

## 2015-02-10 DIAGNOSIS — I10 Essential (primary) hypertension: Secondary | ICD-10-CM | POA: Diagnosis present

## 2015-02-10 DIAGNOSIS — Z96652 Presence of left artificial knee joint: Secondary | ICD-10-CM | POA: Diagnosis present

## 2015-02-10 DIAGNOSIS — E7800 Pure hypercholesterolemia, unspecified: Secondary | ICD-10-CM | POA: Diagnosis present

## 2015-02-10 DIAGNOSIS — M1711 Unilateral primary osteoarthritis, right knee: Principal | ICD-10-CM | POA: Diagnosis present

## 2015-02-10 DIAGNOSIS — Z87891 Personal history of nicotine dependence: Secondary | ICD-10-CM

## 2015-02-10 DIAGNOSIS — Z8249 Family history of ischemic heart disease and other diseases of the circulatory system: Secondary | ICD-10-CM

## 2015-02-10 HISTORY — PX: TOTAL KNEE ARTHROPLASTY: SHX125

## 2015-02-10 SURGERY — ARTHROPLASTY, KNEE, TOTAL
Anesthesia: Regional | Site: Knee | Laterality: Right

## 2015-02-10 MED ORDER — ACETAMINOPHEN 650 MG RE SUPP: 650.0000 mg | Freq: Four times a day (QID) | RECTAL | Status: DC | PRN

## 2015-02-10 MED ORDER — LORATADINE 10 MG PO TABS: 10.0000 mg | ORAL_TABLET | Freq: Every day | ORAL | Status: DC | PRN

## 2015-02-10 MED ORDER — OXYCODONE HCL 5 MG PO TABS
5.0000 mg | ORAL_TABLET | ORAL | Status: DC | PRN
  Administered 2015-02-10 – 2015-02-12 (×13): 10 mg via ORAL
  Filled 2015-02-10 (×13): qty 2

## 2015-02-10 MED ORDER — CALCIUM CARBONATE-VITAMIN D 500-200 MG-UNIT PO TABS
1.0000 | ORAL_TABLET | Freq: Every day | ORAL | Status: DC
  Administered 2015-02-11 – 2015-02-12 (×2): 1 via ORAL
  Filled 2015-02-10 (×2): qty 1

## 2015-02-10 MED ORDER — OXYCODONE HCL 5 MG PO TABS: ORAL_TABLET | ORAL | Status: DC

## 2015-02-10 MED ORDER — PHENYLEPHRINE 40 MCG/ML (10ML) SYRINGE FOR IV PUSH (FOR BLOOD PRESSURE SUPPORT)
PREFILLED_SYRINGE | INTRAVENOUS | Status: AC
  Filled 2015-02-10: qty 10

## 2015-02-10 MED ORDER — BISACODYL 10 MG RE SUPP: 10.0000 mg | Freq: Every day | RECTAL | Status: DC | PRN

## 2015-02-10 MED ORDER — MIDAZOLAM HCL 2 MG/2ML IJ SOLN
INTRAMUSCULAR | Status: AC
  Filled 2015-02-10: qty 2

## 2015-02-10 MED ORDER — FAMOTIDINE 20 MG PO TABS
20.0000 mg | ORAL_TABLET | Freq: Two times a day (BID) | ORAL | Status: DC | PRN
  Administered 2015-02-11: 20 mg via ORAL
  Filled 2015-02-10: qty 1

## 2015-02-10 MED ORDER — OXYCODONE HCL 5 MG PO TABS
ORAL_TABLET | ORAL | Status: AC
  Filled 2015-02-10: qty 2

## 2015-02-10 MED ORDER — PHENYLEPHRINE HCL 10 MG/ML IJ SOLN
INTRAMUSCULAR | Status: DC | PRN
  Administered 2015-02-10: 80 ug via INTRAVENOUS
  Administered 2015-02-10: 120 ug via INTRAVENOUS
  Administered 2015-02-10: 160 ug via INTRAVENOUS
  Administered 2015-02-10: 80 ug via INTRAVENOUS
  Administered 2015-02-10: 120 ug via INTRAVENOUS
  Administered 2015-02-10: 40 ug via INTRAVENOUS
  Administered 2015-02-10: 80 ug via INTRAVENOUS
  Administered 2015-02-10 (×2): 120 ug via INTRAVENOUS
  Administered 2015-02-10: 40 ug via INTRAVENOUS
  Administered 2015-02-10 (×3): 80 ug via INTRAVENOUS

## 2015-02-10 MED ORDER — SUCCINYLCHOLINE CHLORIDE 20 MG/ML IJ SOLN
INTRAMUSCULAR | Status: AC
  Filled 2015-02-10: qty 1

## 2015-02-10 MED ORDER — ONDANSETRON HCL 4 MG/2ML IJ SOLN: 4.0000 mg | Freq: Once | INTRAMUSCULAR | Status: DC | PRN

## 2015-02-10 MED ORDER — ADULT MULTIVITAMIN W/MINERALS CH
1.0000 | ORAL_TABLET | Freq: Every day | ORAL | Status: DC
  Administered 2015-02-11 – 2015-02-12 (×2): 1 via ORAL
  Filled 2015-02-10 (×2): qty 1

## 2015-02-10 MED ORDER — BUPIVACAINE IN DEXTROSE 0.75-8.25 % IT SOLN
INTRATHECAL | Status: DC | PRN
  Administered 2015-02-10: 1.7 mL via INTRATHECAL

## 2015-02-10 MED ORDER — HYDROMORPHONE HCL 1 MG/ML IJ SOLN
INTRAMUSCULAR | Status: AC
  Filled 2015-02-10: qty 1

## 2015-02-10 MED ORDER — ONDANSETRON HCL 4 MG PO TABS: 4.0000 mg | ORAL_TABLET | Freq: Four times a day (QID) | ORAL | Status: DC | PRN

## 2015-02-10 MED ORDER — VITAMIN D 1000 UNITS PO TABS
1000.0000 [IU] | ORAL_TABLET | Freq: Every day | ORAL | Status: DC
  Administered 2015-02-11 – 2015-02-12 (×2): 1000 [IU] via ORAL
  Filled 2015-02-10 (×2): qty 1

## 2015-02-10 MED ORDER — HYDROMORPHONE HCL 1 MG/ML IJ SOLN
1.0000 mg | INTRAMUSCULAR | Status: DC | PRN
  Administered 2015-02-10 – 2015-02-11 (×4): 1 mg via INTRAVENOUS
  Filled 2015-02-10 (×3): qty 1

## 2015-02-10 MED ORDER — LOSARTAN POTASSIUM 50 MG PO TABS
50.0000 mg | ORAL_TABLET | Freq: Every day | ORAL | Status: DC
  Administered 2015-02-10: 50 mg via ORAL
  Filled 2015-02-10: qty 1

## 2015-02-10 MED ORDER — HYDROMORPHONE HCL 1 MG/ML IJ SOLN
0.5000 mg | INTRAMUSCULAR | Status: AC | PRN
  Administered 2015-02-10 (×4): 0.5 mg via INTRAVENOUS

## 2015-02-10 MED ORDER — FLEET ENEMA 7-19 GM/118ML RE ENEM: 1.0000 | ENEMA | Freq: Once | RECTAL | Status: DC | PRN

## 2015-02-10 MED ORDER — SENNOSIDES-DOCUSATE SODIUM 8.6-50 MG PO TABS: 1.0000 | ORAL_TABLET | Freq: Every evening | ORAL | Status: DC | PRN

## 2015-02-10 MED ORDER — PHENOL 1.4 % MT LIQD: 1.0000 | OROMUCOSAL | Status: DC | PRN

## 2015-02-10 MED ORDER — PROPOFOL 10 MG/ML IV BOLUS
INTRAVENOUS | Status: AC
  Filled 2015-02-10: qty 20

## 2015-02-10 MED ORDER — CALCIUM CARB-CHOLECALCIFEROL 600-800 MG-UNIT PO TABS: 1.0000 | ORAL_TABLET | Freq: Every day | ORAL | Status: DC

## 2015-02-10 MED ORDER — BUPIVACAINE-EPINEPHRINE (PF) 0.5% -1:200000 IJ SOLN
INTRAMUSCULAR | Status: DC | PRN
  Administered 2015-02-10: 25 mL via PERINEURAL

## 2015-02-10 MED ORDER — MIDAZOLAM HCL 5 MG/5ML IJ SOLN
INTRAMUSCULAR | Status: DC | PRN
  Administered 2015-02-10: 2 mg via INTRAVENOUS

## 2015-02-10 MED ORDER — CEFAZOLIN SODIUM 1-5 GM-% IV SOLN
1.0000 g | Freq: Four times a day (QID) | INTRAVENOUS | Status: AC
  Administered 2015-02-10 (×2): 1 g via INTRAVENOUS
  Filled 2015-02-10 (×2): qty 50

## 2015-02-10 MED ORDER — METOCLOPRAMIDE HCL 5 MG PO TABS: 5.0000 mg | ORAL_TABLET | Freq: Three times a day (TID) | ORAL | Status: DC | PRN

## 2015-02-10 MED ORDER — MENTHOL 3 MG MT LOZG: 1.0000 | LOZENGE | OROMUCOSAL | Status: DC | PRN

## 2015-02-10 MED ORDER — DOCUSATE SODIUM 100 MG PO CAPS
100.0000 mg | ORAL_CAPSULE | Freq: Two times a day (BID) | ORAL | Status: DC
  Administered 2015-02-10 – 2015-02-12 (×4): 100 mg via ORAL
  Filled 2015-02-10 (×4): qty 1

## 2015-02-10 MED ORDER — FENTANYL CITRATE (PF) 100 MCG/2ML IJ SOLN
INTRAMUSCULAR | Status: DC | PRN
  Administered 2015-02-10: 25 ug via INTRAVENOUS
  Administered 2015-02-10 (×3): 50 ug via INTRAVENOUS
  Administered 2015-02-10: 25 ug via INTRAVENOUS
  Administered 2015-02-10: 50 ug via INTRAVENOUS

## 2015-02-10 MED ORDER — LACTATED RINGERS IV SOLN
INTRAVENOUS | Status: DC | PRN
  Administered 2015-02-10 (×3): via INTRAVENOUS

## 2015-02-10 MED ORDER — DIPHENHYDRAMINE HCL 12.5 MG/5ML PO ELIX: 12.5000 mg | ORAL_SOLUTION | ORAL | Status: DC | PRN

## 2015-02-10 MED ORDER — ACETAMINOPHEN 325 MG PO TABS: 650.0000 mg | ORAL_TABLET | Freq: Four times a day (QID) | ORAL | Status: DC | PRN

## 2015-02-10 MED ORDER — PROPOFOL 10 MG/ML IV BOLUS
INTRAVENOUS | Status: DC | PRN
  Administered 2015-02-10 (×2): 30 mg via INTRAVENOUS

## 2015-02-10 MED ORDER — EPHEDRINE SULFATE 50 MG/ML IJ SOLN
INTRAMUSCULAR | Status: AC
  Filled 2015-02-10: qty 1

## 2015-02-10 MED ORDER — SODIUM CHLORIDE 0.9 % IV SOLN
INTRAVENOUS | Status: DC
  Administered 2015-02-10: 16:00:00 via INTRAVENOUS

## 2015-02-10 MED ORDER — LIDOCAINE HCL (CARDIAC) 20 MG/ML IV SOLN
INTRAVENOUS | Status: AC
  Filled 2015-02-10: qty 5

## 2015-02-10 MED ORDER — APIXABAN 2.5 MG PO TABS
2.5000 mg | ORAL_TABLET | Freq: Two times a day (BID) | ORAL | Status: DC
  Administered 2015-02-11 – 2015-02-12 (×3): 2.5 mg via ORAL
  Filled 2015-02-10 (×3): qty 1

## 2015-02-10 MED ORDER — VITAMIN C 500 MG PO TABS
500.0000 mg | ORAL_TABLET | Freq: Every day | ORAL | Status: DC
  Administered 2015-02-11 – 2015-02-12 (×2): 500 mg via ORAL
  Filled 2015-02-10 (×2): qty 1

## 2015-02-10 MED ORDER — ATORVASTATIN CALCIUM 10 MG PO TABS
10.0000 mg | ORAL_TABLET | Freq: Every day | ORAL | Status: DC
  Administered 2015-02-10 – 2015-02-11 (×2): 10 mg via ORAL
  Filled 2015-02-10 (×2): qty 1

## 2015-02-10 MED ORDER — METOCLOPRAMIDE HCL 5 MG/ML IJ SOLN: 5.0000 mg | Freq: Three times a day (TID) | INTRAMUSCULAR | Status: DC | PRN

## 2015-02-10 MED ORDER — SODIUM CHLORIDE 0.9 % IR SOLN
Status: DC | PRN
  Administered 2015-02-10: 1000 mL

## 2015-02-10 MED ORDER — SODIUM CHLORIDE 0.9 % IJ SOLN
INTRAMUSCULAR | Status: AC
  Filled 2015-02-10: qty 10

## 2015-02-10 MED ORDER — FENTANYL CITRATE (PF) 250 MCG/5ML IJ SOLN
INTRAMUSCULAR | Status: AC
  Filled 2015-02-10: qty 5

## 2015-02-10 MED ORDER — PROPOFOL 500 MG/50ML IV EMUL
INTRAVENOUS | Status: DC | PRN
  Administered 2015-02-10: 50 ug/kg/min via INTRAVENOUS
  Administered 2015-02-10: 09:00:00 via INTRAVENOUS
  Administered 2015-02-10: 90 ug/kg/min via INTRAVENOUS

## 2015-02-10 MED ORDER — ONDANSETRON HCL 4 MG/2ML IJ SOLN
4.0000 mg | Freq: Four times a day (QID) | INTRAMUSCULAR | Status: DC | PRN
  Administered 2015-02-10: 4 mg via INTRAVENOUS
  Filled 2015-02-10: qty 2

## 2015-02-10 SURGICAL SUPPLY — 64 items
BANDAGE ELASTIC 4 VELCRO ST LF (GAUZE/BANDAGES/DRESSINGS) ×3 IMPLANT
BANDAGE ELASTIC 6 VELCRO ST LF (GAUZE/BANDAGES/DRESSINGS) ×3 IMPLANT
BANDAGE ESMARK 6X9 LF (GAUZE/BANDAGES/DRESSINGS) ×1 IMPLANT
BLADE SAGITTAL 25.0X1.19X90 (BLADE) ×2 IMPLANT
BLADE SAGITTAL 25.0X1.19X90MM (BLADE) ×1
BLADE SAW SAG 90X13X1.27 (BLADE) ×3 IMPLANT
BNDG CMPR 9X6 STRL LF SNTH (GAUZE/BANDAGES/DRESSINGS) ×1
BNDG ESMARK 6X9 LF (GAUZE/BANDAGES/DRESSINGS) ×3
BOWL SMART MIX CTS (DISPOSABLE) ×3 IMPLANT
CAP KNEE TOTAL 3 SIGMA ×2 IMPLANT
CEMENT HV SMART SET (Cement) ×4 IMPLANT
COVER SURGICAL LIGHT HANDLE (MISCELLANEOUS) ×3 IMPLANT
CUFF TOURNIQUET SINGLE 34IN LL (TOURNIQUET CUFF) ×3 IMPLANT
CUFF TOURNIQUET SINGLE 44IN (TOURNIQUET CUFF) IMPLANT
DRAPE INCISE IOBAN 66X45 STRL (DRAPES) IMPLANT
DRAPE ORTHO SPLIT 77X108 STRL (DRAPES) ×6
DRAPE SURG ORHT 6 SPLT 77X108 (DRAPES) ×2 IMPLANT
DRAPE U-SHAPE 47X51 STRL (DRAPES) ×3 IMPLANT
DRSG ADAPTIC 3X8 NADH LF (GAUZE/BANDAGES/DRESSINGS) ×3 IMPLANT
DRSG PAD ABDOMINAL 8X10 ST (GAUZE/BANDAGES/DRESSINGS) ×6 IMPLANT
DURAPREP 26ML APPLICATOR (WOUND CARE) ×3 IMPLANT
ELECT REM PT RETURN 9FT ADLT (ELECTROSURGICAL) ×3
ELECTRODE REM PT RTRN 9FT ADLT (ELECTROSURGICAL) ×1 IMPLANT
EVACUATOR 1/8 PVC DRAIN (DRAIN) ×2 IMPLANT
FACESHIELD WRAPAROUND (MASK) ×6 IMPLANT
FACESHIELD WRAPAROUND OR TEAM (MASK) ×2 IMPLANT
FLOSEAL 10ML (HEMOSTASIS) IMPLANT
GAUZE SPONGE 4X4 12PLY STRL (GAUZE/BANDAGES/DRESSINGS) ×3 IMPLANT
GLOVE BIOGEL PI IND STRL 8 (GLOVE) ×4 IMPLANT
GLOVE BIOGEL PI INDICATOR 8 (GLOVE) ×8
GLOVE ORTHO TXT STRL SZ7.5 (GLOVE) ×3 IMPLANT
GLOVE SURG ORTHO 8.0 STRL STRW (GLOVE) ×3 IMPLANT
GOWN STRL REUS W/ TWL LRG LVL3 (GOWN DISPOSABLE) ×2 IMPLANT
GOWN STRL REUS W/ TWL XL LVL3 (GOWN DISPOSABLE) ×1 IMPLANT
GOWN STRL REUS W/TWL 2XL LVL3 (GOWN DISPOSABLE) ×3 IMPLANT
GOWN STRL REUS W/TWL LRG LVL3 (GOWN DISPOSABLE) ×6
GOWN STRL REUS W/TWL XL LVL3 (GOWN DISPOSABLE) ×3
HANDPIECE INTERPULSE COAX TIP (DISPOSABLE) ×3
HOOD PEEL AWAY FACE SHEILD DIS (HOOD) ×3 IMPLANT
IMMOBILIZER KNEE 22 UNIV (SOFTGOODS) IMPLANT
KIT BASIN OR (CUSTOM PROCEDURE TRAY) ×3 IMPLANT
KIT ROOM TURNOVER OR (KITS) ×3 IMPLANT
MANIFOLD NEPTUNE II (INSTRUMENTS) ×3 IMPLANT
NEEDLE 22X1 1/2 (OR ONLY) (NEEDLE) IMPLANT
NS IRRIG 1000ML POUR BTL (IV SOLUTION) ×3 IMPLANT
PACK TOTAL JOINT (CUSTOM PROCEDURE TRAY) ×3 IMPLANT
PACK UNIVERSAL I (CUSTOM PROCEDURE TRAY) ×3 IMPLANT
PAD ARMBOARD 7.5X6 YLW CONV (MISCELLANEOUS) ×6 IMPLANT
PAD CAST 4YDX4 CTTN HI CHSV (CAST SUPPLIES) ×1 IMPLANT
PADDING CAST COTTON 4X4 STRL (CAST SUPPLIES) ×3
PADDING CAST COTTON 6X4 STRL (CAST SUPPLIES) ×3 IMPLANT
SET HNDPC FAN SPRY TIP SCT (DISPOSABLE) ×1 IMPLANT
STAPLER VISISTAT 35W (STAPLE) ×3 IMPLANT
SUCTION FRAZIER TIP 10 FR DISP (SUCTIONS) ×3 IMPLANT
SUT ETHIBOND NAB CT1 #1 30IN (SUTURE) ×5 IMPLANT
SUT VIC AB 0 CT1 27 (SUTURE) ×3
SUT VIC AB 0 CT1 27XBRD ANBCTR (SUTURE) ×1 IMPLANT
SUT VIC AB 2-0 CT1 27 (SUTURE) ×6
SUT VIC AB 2-0 CT1 TAPERPNT 27 (SUTURE) ×2 IMPLANT
SYR CONTROL 10ML LL (SYRINGE) IMPLANT
TOWEL OR 17X24 6PK STRL BLUE (TOWEL DISPOSABLE) ×3 IMPLANT
TOWEL OR 17X26 10 PK STRL BLUE (TOWEL DISPOSABLE) ×3 IMPLANT
TRAY FOLEY CATH 16FRSI W/METER (SET/KITS/TRAYS/PACK) IMPLANT
WATER STERILE IRR 1000ML POUR (IV SOLUTION) ×1 IMPLANT

## 2015-02-10 NOTE — H&P (View-Only) (Signed)
TOTAL KNEE ADMISSION H&P  Patient is being admitted for right total knee arthroplasty.  Subjective:  Chief Complaint:right knee pain.  HPI: Ashley Cooke, 75 y.o. female, has a history of pain and functional disability in the right knee due to arthritis and has failed non-surgical conservative treatments for greater than 12 weeks to includeNSAID's and/or analgesics, corticosteriod injections, viscosupplementation injections, use of assistive devices and activity modification.  Onset of symptoms was gradual, starting 7 years ago with gradually worsening course since that time. The patient noted no past surgery on the right knee(s).  Patient currently rates pain in the right knee(s) at 8 out of 10 with activity. Patient has night pain, worsening of pain with activity and weight bearing, pain that interferes with activities of daily living, pain with passive range of motion, crepitus and joint swelling.  Patient has evidence of periarticular osteophytes and joint space narrowing by imaging studies.There is no active infection.  Patient Active Problem List   Diagnosis Date Noted  . Primary localized osteoarthritis of left knee 10/14/2014  . Pure hypercholesterolemia 10/10/2013  . Postmenopausal hormone replacement therapy 10/10/2013  . Essential hypertension 10/10/2013  . Arthritis 10/10/2013   Past Medical History  Diagnosis Date  . Hypertension   . Melanoma of lower leg 05/2003    "left"  . Squamous carcinoma     "all on the left side:  under eye, nostril, bicept"  . Seasonal allergies   . Hypercholesterolemia   . Arthritis     "primarily in my knee & thumbs" (10/15/2014)    Past Surgical History  Procedure Laterality Date  . Melanoma excision Left 05/2003    leg  . Knee arthroscopy Left 05/06/2012    torn miniscus  . Tonsillectomy and adenoidectomy  1949  . Joint replacement    . Total knee arthroplasty Left 10/14/2014  . Squamous cell carcinoma excision Left 2008    upper bicept  .  Mohs surgery Left 2000's    "squamous; under eye and nostril"  . Dilation and curettage of uterus  1968  . Total knee arthroplasty Left 10/14/2014    Procedure: TOTAL KNEE ARTHROPLASTY;  Surgeon: Earlie Server, MD;  Location: [facility];  Service: Orthopedics;  Laterality: Left;     (Not in a hospital admission) No Known Allergies  Social History  Substance Use Topics  . Smoking status: Former Smoker -- 0.50 packs/day for 22 years    Types: Cigarettes    Quit date: 06/22/1979  . Smokeless tobacco: Never Used  . Alcohol Use: 4.2 oz/week    7 Glasses of wine per week    Family History  Problem Relation Age of Onset  . Colon cancer Neg Hx   . Hypertension Mother   . Hypertension Sister      Review of Systems  Musculoskeletal: Positive for joint pain.  All other systems reviewed and are negative.   Objective:  Physical Exam  Constitutional: She is oriented to person, place, and time. She appears well-developed and well-nourished. No distress.  HENT:  Head: Normocephalic and atraumatic.  Eyes: Conjunctivae and EOM are normal. Pupils are equal, round, and reactive to light.  Neck: Normal range of motion. Neck supple.  Cardiovascular: Normal rate, regular rhythm, normal heart sounds and intact distal pulses.   Respiratory: Effort normal and breath sounds normal. No respiratory distress. She has no wheezes.  GI: Soft. Bowel sounds are normal. She exhibits no distension. There is no tenderness.  Musculoskeletal:       Right  knee: She exhibits no erythema, no LCL laxity and no MCL laxity. Tenderness found. Medial joint line tenderness noted.  Lymphadenopathy:    She has no cervical adenopathy.  Neurological: She is alert and oriented to person, place, and time. No cranial nerve deficit.  Skin: Skin is warm and dry. No rash noted. No erythema.  Psychiatric: She has a normal mood and affect. Her behavior is normal.    Vital signs in last 24 hours: @VSRANGES @  Labs:   Estimated  body mass index is 29.99 kg/(m^2) as calculated from the following:   Height as of 10/12/14: 5' 2.75" (1.594 m).   Weight as of 10/12/14: 76.204 kg (168 lb).   Imaging Review Plain radiographs demonstrate severe degenerative joint disease of the right knee(s). The overall alignment issignificant varus. The bone quality appears to be good for age and reported activity level.  Assessment/Plan:  End stage arthritis, right knee   The patient history, physical examination, clinical judgment of the provider and imaging studies are consistent with end stage degenerative joint disease of the right knee(s) and total knee arthroplasty is deemed medically necessary. The treatment options including medical management, injection therapy arthroscopy and arthroplasty were discussed at length. The risks and benefits of total knee arthroplasty were presented and reviewed. The risks due to aseptic loosening, infection, stiffness, patella tracking problems, thromboembolic complications and other imponderables were discussed. The patient acknowledged the explanation, agreed to proceed with the plan and consent was signed. Patient is being admitted for inpatient treatment for surgery, pain control, PT, OT, prophylactic antibiotics, VTE prophylaxis, progressive ambulation and ADL's and discharge planning. The patient is planning to be discharged home with home health services

## 2015-02-10 NOTE — Interval H&P Note (Signed)
History and Physical Interval Note:  02/10/2015 7:27 AM  Ashley Cooke  has presented today for surgery, with the diagnosis of oa right knee  The various methods of treatment have been discussed with the patient and family. After consideration of risks, benefits and other options for treatment, the patient has consented to  Procedure(s): RIGHT TOTAL KNEE ARTHROPLASTY (Right) as a surgical intervention .  The patient's history has been reviewed, patient examined, no change in status, stable for surgery.  I have reviewed the patient's chart and labs.  Questions were answered to the patient's satisfaction.     Odilia Damico JR,W D

## 2015-02-10 NOTE — Anesthesia Preprocedure Evaluation (Addendum)
Anesthesia Evaluation  Patient identified by MRN, date of birth, ID band Patient awake    Reviewed: Allergy & Precautions, NPO status , Patient's Chart, lab work & pertinent test results  History of Anesthesia Complications Negative for: history of anesthetic complications  Airway Mallampati: I  TM Distance: >3 FB Neck ROM: Full    Dental  (+) Teeth Intact   Pulmonary former smoker,    Pulmonary exam normal breath sounds clear to auscultation       Cardiovascular hypertension, Pt. on medications (-) angina(-) Past MI and (-) CHF Normal cardiovascular exam Rhythm:Regular     Neuro/Psych    GI/Hepatic negative GI ROS, Neg liver ROS,   Endo/Other  negative endocrine ROS  Renal/GU negative Renal ROS     Musculoskeletal  (+) Arthritis ,   Abdominal   Peds  Hematology negative hematology ROS (+)   Anesthesia Other Findings   Reproductive/Obstetrics                            Anesthesia Physical Anesthesia Plan  ASA: II  Anesthesia Plan: Spinal and Regional   Post-op Pain Management:    Induction: Intravenous  Airway Management Planned: Natural Airway, Nasal Cannula and Simple Face Mask  Additional Equipment: None  Intra-op Plan:   Post-operative Plan:   Informed Consent: I have reviewed the patients History and Physical, chart, labs and discussed the procedure including the risks, benefits and alternatives for the proposed anesthesia with the patient or authorized representative who has indicated his/her understanding and acceptance.   Dental advisory given  Plan Discussed with: CRNA, Anesthesiologist and Surgeon  Anesthesia Plan Comments:        Anesthesia Quick Evaluation                                  Anesthesia Evaluation  Patient identified by MRN, date of birth, ID band Patient awake    Reviewed: Allergy & Precautions, NPO status , Patient's Chart, lab work &  pertinent test results  History of Anesthesia Complications Negative for: history of anesthetic complications  Airway Mallampati: II  TM Distance: >3 FB Neck ROM: Full    Dental  (+) Teeth Intact   Pulmonary neg shortness of breath, neg sleep apnea, neg COPD, neg recent URI, former smoker, neg PE   breath sounds clear to auscultation       Cardiovascular hypertension, Pt. on medications negative cardio ROS   Rhythm:Regular     Neuro/Psych negative neurological ROS  negative psych ROS   GI/Hepatic Neg liver ROS, GERD  Controlled and Medicated,  Endo/Other  negative endocrine ROS  Renal/GU negative Renal ROS     Musculoskeletal  (+) Arthritis ,   Abdominal   Peds  Hematology negative hematology ROS (+)   Anesthesia Other Findings   Reproductive/Obstetrics                            Anesthesia Physical Anesthesia Plan  ASA: II  Anesthesia Plan: Regional and Spinal   Post-op Pain Management:    Induction:   Airway Management Planned: Nasal Cannula  Additional Equipment: None  Intra-op Plan:   Post-operative Plan:   Informed Consent: I have reviewed the patients History and Physical, chart, labs and discussed the procedure including the risks, benefits and alternatives for the proposed anesthesia with the patient or  authorized representative who has indicated his/her understanding and acceptance.   Dental advisory given  Plan Discussed with: CRNA and Surgeon  Anesthesia Plan Comments:        Anesthesia Quick Evaluation

## 2015-02-10 NOTE — Op Note (Signed)
NAMEMarissa, Ashley Cooke                   ACCOUNT NO.:  [id number]  MEDICAL RECORD NO.:  [id number]  LOCATION:  [facility]                         FACILITY:  [facility]  PHYSICIAN:  Lockie Pares, M.D.    DATE OF BIRTH:  12/08/1940  DATE OF PROCEDURE:  02/10/2015 DATE OF DISCHARGE:                              OPERATIVE REPORT   PREOPERATIVE DIAGNOSIS:  Osteoarthritis, right knee, with varus deformity.  POSTOPERATIVE DIAGNOSIS:  Osteoarthritis, right knee, with varus deformity.  OPERATION:  Right total knee replacement (Sigma cemented knee with size 3 femur, tibia 12.5 mm bearing and 35-mm all poly patella).  SURGEON:  Lockie Pares, M.D.  ASSISTANT:  Chriss Czar, PA-C.  ANESTHESIA:  Spinal with a nerve block.  TOURNIQUET TIME:  59 minutes.  DESCRIPTION OF PROCEDURE:  Sterile prep and drape, exsanguination of the leg, inflation of the tourniquet to 350, straight skin incision with a medial parapatellar approach to the knee made.  We cut 11 degrees of 5- degree valgus cut off the femur, followed by cutting about 3-4 mm below the most diseased medial compartment with a provisional cuts for additional millimeter resection on the tibia.  Extension gap eventually measured at 12.5 mm.  Sized the femur to be a size 3 femur followed by placement of the all in 1 cutting block with the appropriate degree of external rotation.  Cut the post anterior-posterior chamfers and as well as the anterior posterior cuts without difficulty.  Next, attention directed at the tibia.  We cut the keel for the tibia for a size 3 with good coverage noted.  I placed the trial tibial base plate, followed by box cut on the femur.  Trial femur was placed.  A 12.5 mm bearing, obtained full extension, excellent stability in flexion, good balancing of the collateral ligaments was noted as well.  Patella was cut leaving about 14 mm of native patella for a 35 mm patella.  All trials were placed, again all parameters  stability and mobility were acceptable relative to 0 degrees of extension.  We then removed the trial components and inserted the final components with cement in a doughy state, tibia followed by femur patella.  We elected to use a trial bearing with the cement hardened.  The bearing was removed.  Small bits of cement were removed in the posterior aspect of the knee.  The tourniquet was released.  No excessive bleeding was noted.  The Hemovac drain was placed in the superior lateral gutter.  Closure was affected with #1 Ethibond 2-0 Vicryl and skin clips on the skin.  Lightly compressive sterile dressing.  Sling applied.  Taken to recovery in stable condition.     Lockie Pares, M.D.     WDC/MEDQ  D:  02/10/2015  T:  02/10/2015  Job:  [id number]

## 2015-02-10 NOTE — Brief Op Note (Signed)
02/10/2015  10:07 AM  PATIENT:  Ashley Cooke  75 y.o. female  PRE-OPERATIVE DIAGNOSIS:  oa right knee  POST-OPERATIVE DIAGNOSIS:  same   PROCEDURE:  Procedure(s): RIGHT TOTAL KNEE ARTHROPLASTY (Right)  SURGEON:  Surgeon(s) and Role:    * Earlie Server, MD - Primary  PHYSICIAN ASSISTANT: Chriss Czar, PA-C  ASSISTANTS:    ANESTHESIA:   spinal and IV sedation  EBL:  Total I/O In: 2000 [I.V.:2000] Out: -   BLOOD ADMINISTERED:none  DRAINS: 1 hemovac drain lateral right knee self suction   LOCAL MEDICATIONS USED:  NONE  SPECIMEN:  No Specimen  DISPOSITION OF SPECIMEN:  N/A  COUNTS:  YES  TOURNIQUET:   Total Tourniquet Time Documented: Thigh (Right) - 60 minutes Total: Thigh (Right) - 60 minutes   DICTATION: .Other Dictation: Dictation Number unknown  PLAN OF CARE: Admit to inpatient   PATIENT DISPOSITION:  PACU - hemodynamically stable.   Delay start of Pharmacological VTE agent (>24hrs) due to surgical blood loss or risk of bleeding: yes

## 2015-02-10 NOTE — Evaluation (Signed)
Physical Therapy Evaluation Patient Details Name: Ashley Cooke MRN: [id number] DOB: 1940/11/01 Today's Date: 02/10/2015   History of Present Illness  Patient is a 75 yo female admitted 02/10/15 and is s/p Rt TKA.   PMH:  Lt TKA 09/2014, OA, HTN  Clinical Impression  Patient presents with problems listed below.  Will benefit from acute PT to maximize functional independence prior to return home with husband.  Recommend HHPT for f/u PT at d/c.    Follow Up Recommendations Home health PT;Supervision for mobility/OOB    Equipment Recommendations  None recommended by PT    Recommendations for Other Services       Precautions / Restrictions Precautions Precautions: Knee Precaution Booklet Issued: Yes (comment) Precaution Comments: Reviewed precautions Required Braces or Orthoses: Knee Immobilizer - Right Knee Immobilizer - Right: On when out of bed or walking (D/C when spinal wears off) Restrictions Weight Bearing Restrictions: Yes RLE Weight Bearing: Weight bearing as tolerated      Mobility  Bed Mobility Overal bed mobility: Needs Assistance Bed Mobility: Supine to Sit     Supine to sit: Min assist     General bed mobility comments: Patient asking for bed pan emergently.  Able to boost to place bedpan.  Patient then became nauseated and vomited.  Reported she felt better and wanted to get OOB.  Instructed patient on donning KI on RLE.  Verbal cues for bed mobility.  Min assist to move to sitting - assist to manage RLE.  Once upright, patient with good sitting balance.  Transfers Overall transfer level: Needs assistance Equipment used: Rolling walker (2 wheeled) Transfers: Sit to/from [facility] Sit to Stand: Min assist Stand pivot transfers: Min assist       General transfer comment: Verbal cues for hand placement and technique.  Assist to rise to standing from bed and BSC.  Able to maintain balance with RW.  Patient able to take several shuffle steps to  pivot to [facility] and then to recliner.  Min assist for balance and safety.  RLE with decreased knee control, supported by KI.  Ambulation/Gait                Stairs            Wheelchair Mobility    Modified Rankin (Stroke Patients Only)       Balance Overall balance assessment: Needs assistance Sitting-balance support: No upper extremity supported;Feet supported Sitting balance-Leahy Scale: Good     Standing balance support: Bilateral upper extremity supported Standing balance-Leahy Scale: Poor                               Pertinent Vitals/Pain Pain Assessment: 0-10 Pain Score: 5  Pain Location: Rt knee Pain Descriptors / Indicators: Aching;Sore Pain Intervention(s): Monitored during session;Repositioned;Patient requesting pain meds-RN notified    Home Living Family/patient expects to be discharged to:: Private residence Living Arrangements: Spouse/significant other Available Help at Discharge: Family;Available 24 hours/day Type of Home: House Home Access: Stairs to enter Entrance Stairs-Rails: None Entrance Stairs-Number of Steps: 1 Home Layout: One level Home Equipment: Walker - 2 wheels;Bedside commode;Shower seat      Prior Function Level of Independence: Independent         Comments: Pt very active- plays tennis, does tai chi, goes to the gym and swims. Was skiing last winter.     Hand Dominance   Dominant Hand: Right    Extremity/Trunk Assessment  Upper Extremity Assessment: Overall WFL for tasks assessed           Lower Extremity Assessment: RLE deficits/detail RLE Deficits / Details: Decreased ROM and strength due to pain/surgery    Cervical / Trunk Assessment: Normal  Communication   Communication: No difficulties  Cognition Arousal/Alertness: Awake/alert Behavior During Therapy: WFL for tasks assessed/performed Overall Cognitive Status: Within Functional Limits for tasks assessed                       General Comments      Exercises Total Joint Exercises Ankle Circles/Pumps: AROM;Both;10 reps;Seated Quad Sets: AROM;Right;5 reps;Seated      Assessment/Plan    PT Assessment Patient needs continued PT services  PT Diagnosis Difficulty walking;Generalized weakness;Acute pain   PT Problem List Decreased strength;Decreased range of motion;Decreased activity tolerance;Decreased balance;Decreased mobility;Decreased knowledge of use of DME;Decreased knowledge of precautions;Pain  PT Treatment Interventions DME instruction;Gait training;Functional mobility training;Therapeutic activities;Therapeutic exercise;Patient/family education   PT Goals (Current goals can be found in the Care Plan section) Acute Rehab PT Goals Patient Stated Goal: To return home PT Goal Formulation: With patient Time For Goal Achievement: 02/17/15 Potential to Achieve Goals: Good    Frequency 7X/week   Barriers to discharge        Co-evaluation               End of Session Equipment Utilized During Treatment: Gait belt;Right knee immobilizer Activity Tolerance: Patient limited by pain (Limited by nausea) Patient left: in chair;with call bell/phone within reach Nurse Communication: Mobility status;Patient requests pain meds (Nausea/vomited)         Time: 9833-8250 PT Time Calculation (min) (ACUTE ONLY): 33 min   Charges:   PT Evaluation $PT Eval Moderate Complexity: 1 Procedure PT Treatments $Therapeutic Activity: 8-22 mins   PT G Codes:        [name] 02/17/15, 7:56 PM [name]. [name], [place] Pager [phone]

## 2015-02-10 NOTE — Progress Notes (Signed)
Orthopedic Tech Progress Note Patient Details:  Ashley Cooke April 24, 1940 [id number]  CPM Right Knee CPM Right Knee: On Right Knee Flexion (Degrees): 60 Right Knee Extension (Degrees): 0 Additional Comments: Trapeze bar   [name] 02/10/2015, 11:07 AM

## 2015-02-10 NOTE — Anesthesia Postprocedure Evaluation (Signed)
Anesthesia Post Note  Patient: Ashley Cooke  Procedure(s) Performed: Procedure(s) (LRB): RIGHT TOTAL KNEE ARTHROPLASTY (Right)  Patient location during evaluation: PACU Anesthesia Type: Regional and Spinal Level of consciousness: awake Pain management: pain level controlled Vital Signs Assessment: post-procedure vital signs reviewed and stable Respiratory status: spontaneous breathing Cardiovascular status: stable Postop Assessment: spinal receding and no signs of nausea or vomiting Anesthetic complications: no    Last Vitals:  Filed Vitals:   02/10/15 1330 02/10/15 1345  BP: 129/95 117/59  Pulse: 82 86  Temp:    Resp: 30 23    Last Pain:  Filed Vitals:   02/10/15 1407  PainSc: 7                  Jaxtyn Linville

## 2015-02-10 NOTE — Progress Notes (Signed)
Utilization review completed.  

## 2015-02-10 NOTE — Anesthesia Procedure Notes (Signed)
Spinal Patient location during procedure: OR Staffing Anesthesiologist: Terriah Reggio Preanesthetic Checklist Completed: patient identified, surgical consent, pre-op evaluation, timeout performed, IV checked, risks and benefits discussed and monitors and equipment checked Spinal Block Patient position: sitting Prep: site prepped and draped and DuraPrep Patient monitoring: heart rate, cardiac monitor, continuous pulse ox and blood pressure Approach: midline Location: L3-4 Injection technique: single-shot Needle Needle type: Pencan  Needle gauge: 24 G Needle length: 10 cm Assessment Sensory level: T4  Anesthesia Regional Block:  Femoral nerve block  Pre-Anesthetic Checklist: ,, timeout performed, Correct Patient, Correct Site, Correct Laterality, Correct Procedure, Correct Position, site marked, Risks and benefits discussed,  Surgical consent,  Pre-op evaluation,  At surgeon's request and post-op pain management  Laterality: Lower and Right  Prep: chloraprep       Needles:  Injection technique: Single-shot  Needle Type: Echogenic Stimulator Needle          Additional Needles:  Procedures: ultrasound guided (picture in chart) and nerve stimulator Femoral nerve block  Nerve Stimulator or Paresthesia:  Response: quad, 0.5 mA,   Additional Responses:   Narrative:  Injection made incrementally with aspirations every 5 mL.  Performed by: Personally  Anesthesiologist: Masyn Fullam  Additional Notes: H+P and labs reviewed, risks and benefits discussed with patient, procedure tolerated well without complications

## 2015-02-10 NOTE — Transfer of Care (Signed)
Immediate Anesthesia Transfer of Care Note  Patient: Ashley Cooke  Procedure(s) Performed: Procedure(s): RIGHT TOTAL KNEE ARTHROPLASTY (Right)  Patient Location: PACU  Anesthesia Type:Regional and Spinal  Level of Consciousness: awake, alert , oriented, patient cooperative and responds to stimulation  Airway & Oxygen Therapy: Patient Spontanous Breathing and Patient connected to nasal cannula oxygen  Post-op Assessment: Report given to RN and Post -op Vital signs reviewed and stable  Post vital signs: Reviewed and stable  Last Vitals:  Filed Vitals:   02/10/15 0550  BP: 142/68  Pulse: 75  Temp: 0000000 C    Complications: No apparent anesthesia complications

## 2015-02-11 LAB — BASIC METABOLIC PANEL
ANION GAP: 5 (ref 5–15)
BUN: 16 mg/dL (ref 6–20)
CALCIUM: 8.7 mg/dL — AB (ref 8.9–10.3)
CO2: 27 mmol/L (ref 22–32)
Chloride: 104 mmol/L (ref 101–111)
Creatinine, Ser: 0.92 mg/dL (ref 0.44–1.00)
GFR, EST NON AFRICAN AMERICAN: 59 mL/min — AB (ref >60)
Glucose, Bld: 110 mg/dL — ABNORMAL HIGH (ref 65–99)
POTASSIUM: 4 mmol/L (ref 3.5–5.1)
Sodium: 136 mmol/L (ref 135–145)

## 2015-02-11 LAB — CBC
HEMATOCRIT: 32.9 % — AB (ref 36.0–46.0)
Hemoglobin: 11.2 g/dL — ABNORMAL LOW (ref 12.0–15.0)
MCH: 31.8 pg (ref 26.0–34.0)
MCHC: 34 g/dL (ref 30.0–36.0)
MCV: 93.5 fL (ref 78.0–100.0)
PLATELETS: 227 10*3/uL (ref 150–400)
RBC: 3.52 MIL/uL — AB (ref 3.87–5.11)
RDW: 15.2 % (ref 11.5–15.5)
WBC: 8.2 10*3/uL (ref 4.0–10.5)

## 2015-02-11 MED ORDER — METHOCARBAMOL 500 MG PO TABS
500.0000 mg | ORAL_TABLET | Freq: Four times a day (QID) | ORAL | Status: DC | PRN
  Administered 2015-02-11 – 2015-02-12 (×4): 1000 mg via ORAL
  Filled 2015-02-11 (×4): qty 2

## 2015-02-11 MED ORDER — POTASSIUM CHLORIDE IN NACL 20-0.9 MEQ/L-% IV SOLN
INTRAVENOUS | Status: DC
  Administered 2015-02-11: 14:00:00 via INTRAVENOUS
  Administered 2015-02-12: 1000 mL via INTRAVENOUS
  Filled 2015-02-11 (×2): qty 1000

## 2015-02-11 MED ORDER — METHOCARBAMOL 500 MG PO TABS
1000.0000 mg | ORAL_TABLET | Freq: Once | ORAL | Status: AC
  Administered 2015-02-11: 1000 mg via ORAL
  Filled 2015-02-11: qty 2

## 2015-02-11 NOTE — Progress Notes (Signed)
PT Cancellation Note  Patient Details Name: TALAYSHIA URSO MRN: [id number] DOB: September 11, 1940   Cancelled Treatment:    Reason Eval/Treat Not Completed: Pain limiting ability to participate.  Patient asked that PT be deferred this pm.  Will return in am.   [name] 02/11/2015, 2:25 PM [name]. [name], [place] Pager [phone]

## 2015-02-11 NOTE — Progress Notes (Signed)
Orthopaedic Trauma Service Progress Note  Subjective  R knee doing very well this am C/o some R thigh pain and spasms Not great sleep last night Did not eat breakfast yet this am  Has been up to chair several times already   +flatus No BM Has voided already   CPM all day yesterday after surgery  Has been in cpm for about 3 hours today   Review of Systems  Constitutional: Negative for fever and chills.  Eyes: Negative for blurred vision.  Respiratory: Negative for shortness of breath and wheezing.   Cardiovascular: Negative for chest pain and palpitations.  Gastrointestinal: Negative for nausea, vomiting and abdominal pain.  Neurological: Negative for tingling, sensory change and headaches.     Objective   BP 104/52 mmHg  Pulse 71  Temp(Src) 98 F (36.7 C) (Oral)  Resp 19  Ht '5\' 3"'  (1.6 m)  Wt 73.936 kg (163 lb)  BMI 28.88 kg/m2  SpO2 99%  LMP 01/21/1977 (Approximate)  Intake/Output      01/20 0701 - 01/21 0700 01/21 0701 - 01/22 0700   P.O.  120   I.V. (mL/kg) 3090 (41.8)    Total Intake(mL/kg) 3090 (41.8) 120 (1.6)   Urine (mL/kg/hr) 1025 (0.6)    Drains 400 (0.2)    Total Output 1425     Net +1665 +120        Urine Occurrence 3 x 0 x     Labs  Results for Cooke, Ashley I (MRN [id number]) as of 02/11/2015 09:43  Ref. Range 02/11/2015 04:12  Sodium Latest Ref Range: 135-145 mmol/L 136  Potassium Latest Ref Range: 3.5-5.1 mmol/L 4.0  Chloride Latest Ref Range: 101-111 mmol/L 104  CO2 Latest Ref Range: 22-32 mmol/L 27  BUN Latest Ref Range: 6-20 mg/dL 16  Creatinine Latest Ref Range: 0.44-1.00 mg/dL 0.92  Calcium Latest Ref Range: 8.9-10.3 mg/dL 8.7 (L)  EGFR (Non-African Amer.) Latest Ref Range: >60 mL/min 59 (L)  EGFR (African American) Latest Ref Range: >60 mL/min >60  Glucose Latest Ref Range: 65-99 mg/dL 110 (H)  Anion gap Latest Ref Range: 5-15  5  WBC Latest Ref Range: 4.0-10.5 K/uL 8.2  RBC Latest Ref Range: 3.87-5.11 MIL/uL 3.52 (L)   Hemoglobin Latest Ref Range: 12.0-15.0 g/dL 11.2 (L)  HCT Latest Ref Range: 36.0-46.0 % 32.9 (L)  MCV Latest Ref Range: 78.0-100.0 fL 93.5  MCH Latest Ref Range: 26.0-34.0 pg 31.8  MCHC Latest Ref Range: 30.0-36.0 g/dL 34.0  RDW Latest Ref Range: 11.5-15.5 % 15.2  Platelets Latest Ref Range: 150-400 K/uL 227    Exam Gen: awake and alert, NAD, resting comfortably  Lungs: clear anterior fields Cardiac: RRR, s1 and s2 Abd: + BS, NTND Ext:   Right Lower Extremity  Knee resting in about 10 degrees of flexion  Passively extended her to full extension  Dressing c/d/i Drain patent DPN, SPN, TN sensation intact EHL, FHL, AT, PT, peroneals, gastroc motor intact Swelling stable Ext warm  + DP pulse Compartments soft and NT, no pain with passive stretching   Mild discomfort around mid thigh but no swelling or discoloration    Assessment and Plan   POD/HD#: 1  75 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 R knee DJD s/p R TKA WBAT  CPM  Discussed about resting with knee in full extension     Pt has zero knee bone foam at home, she will use this at home after dc Pillow placed under ankle for  now   Total knee precautions  PT/OT evals Dressing change and dc drain tomorrow   Plan for dc home tomorrow   Pt lives in a house where all her needs can be met on a single level and husband is well versed in care needed as she had a L TKA in sept 2016  2. Pain management: Continue with current regimen  Encourage PO's   Add robaxin   R Thigh pain may be related to tourniquet use   3.Hemodynamics Stable  BP a little low  Continue with IVF, dc losartan for now   5. Medical issues   No acute issues Continue with home meds   6. DVT/PE prophylaxis: eliquis  7. ID:  periop abx  8. Activity: WBAT  PT/OT evals   9. FEN/GI prophylaxis/Foley/Lines: Reg diet   10. Dispo: Therapy evals Likely stable for dc tomorrow      Jari Pigg, PA-C [facility] [phone] (O) 02/11/2015 9:55 AM

## 2015-02-11 NOTE — Discharge Instructions (Signed)
INSTRUCTIONS AFTER JOINT REPLACEMENT  ° °o Remove items at home which could result in a fall. This includes throw rugs or furniture in walking pathways °o ICE to the affected joint every three hours while awake for 30 minutes at a time, for at least the first 3-5 days, and then as needed for pain and swelling.  Continue to use ice for pain and swelling. You may notice swelling that will progress down to the foot and ankle.  This is normal after surgery.  Elevate your leg when you are not up walking on it.   °o Continue to use the breathing machine you got in the hospital (incentive spirometer) which will help keep your temperature down.  It is common for your temperature to cycle up and down following surgery, especially at night when you are not up moving around and exerting yourself.  The breathing machine keeps your lungs expanded and your temperature down. ° ° °DIET:  As you were doing prior to hospitalization, we recommend a well-balanced diet. ° °DRESSING / WOUND CARE / SHOWERING ° °You may change your dressing 3-5 days after surgery.  Then change the dressing every day with sterile gauze.  Please use good hand washing techniques before changing the dressing.  Do not use any lotions or creams on the incision until instructed by your surgeon. ° °ACTIVITY ° °o Increase activity slowly as tolerated, but follow the weight bearing instructions below.   °o No driving for 6 weeks or until further direction given by your physician.  You cannot drive while taking narcotics.  °o No lifting or carrying greater than 10 lbs. until further directed by your surgeon. °o Avoid periods of inactivity such as sitting longer than an hour when not asleep. This helps prevent blood clots.  °o You may return to work once you are authorized by your doctor.  ° ° ° °WEIGHT BEARING  ° °Weight bearing as tolerated with assist device (walker, cane, etc) as directed, use it as long as suggested by your surgeon or therapist, typically at  least 4-6 weeks. ° ° °EXERCISES ° °Results after joint replacement surgery are often greatly improved when you follow the exercise, range of motion and muscle strengthening exercises prescribed by your doctor. Safety measures are also important to protect the joint from further injury. Any time any of these exercises cause you to have increased pain or swelling, decrease what you are doing until you are comfortable again and then slowly increase them. If you have problems or questions, call your caregiver or physical therapist for advice.  ° °Rehabilitation is important following a joint replacement. After just a few days of immobilization, the muscles of the leg can become weakened and shrink (atrophy).  These exercises are designed to build up the tone and strength of the thigh and leg muscles and to improve motion. Often times heat used for twenty to thirty minutes before working out will loosen up your tissues and help with improving the range of motion but do not use heat for the first two weeks following surgery (sometimes heat can increase post-operative swelling).  ° °These exercises can be done on a training (exercise) mat, on the floor, on a table or on a bed. Use whatever works the best and is most comfortable for you.    Use music or television while you are exercising so that the exercises are a pleasant break in your day. This will make your life better with the exercises acting as a break   in your routine that you can look forward to.   Perform all exercises about fifteen times, three times per day or as directed.  You should exercise both the operative leg and the other leg as well. ° °Exercises include: °  °• Quad Sets - Tighten up the muscle on the front of the thigh (Quad) and hold for 5-10 seconds.   °• Straight Leg Raises - With your knee straight (if you were given a brace, keep it on), lift the leg to 60 degrees, hold for 3 seconds, and slowly lower the leg.  Perform this exercise against  resistance later as your leg gets stronger.  °• Leg Slides: Lying on your back, slowly slide your foot toward your buttocks, bending your knee up off the floor (only go as far as is comfortable). Then slowly slide your foot back down until your leg is flat on the floor again.  °• Angel Wings: Lying on your back spread your legs to the side as far apart as you can without causing discomfort.  °• Hamstring Strength:  Lying on your back, push your heel against the floor with your leg straight by tightening up the muscles of your buttocks.  Repeat, but this time bend your knee to a comfortable angle, and push your heel against the floor.  You may put a pillow under the heel to make it more comfortable if necessary.  ° °A rehabilitation program following joint replacement surgery can speed recovery and prevent re-injury in the future due to weakened muscles. Contact your doctor or a physical therapist for more information on knee rehabilitation.  ° ° °CONSTIPATION ° °Constipation is defined medically as fewer than three stools per week and severe constipation as less than one stool per week.  Even if you have a regular bowel pattern at home, your normal regimen is likely to be disrupted due to multiple reasons following surgery.  Combination of anesthesia, postoperative narcotics, change in appetite and fluid intake all can affect your bowels.  ° °YOU MUST use at least one of the following options; they are listed in order of increasing strength to get the job done.  They are all available over the counter, and you may need to use some, POSSIBLY even all of these options:   ° °Drink plenty of fluids (prune juice may be helpful) and high fiber foods °Colace 100 mg by mouth twice a day  °Senokot for constipation as directed and as needed Dulcolax (bisacodyl), take with full glass of water  °Miralax (polyethylene glycol) once or twice a day as needed. ° °If you have tried all these things and are unable to have a bowel  movement in the first 3-4 days after surgery call either your surgeon or your primary doctor.   ° °If you experience loose stools or diarrhea, hold the medications until you stool forms back up.  If your symptoms do not get better within 1 week or if they get worse, check with your doctor.  If you experience "the worst abdominal pain ever" or develop nausea or vomiting, please contact the office immediately for further recommendations for treatment. ° ° °ITCHING:  If you experience itching with your medications, try taking only a single pain pill, or even half a pain pill at a time.  You can also use Benadryl over the counter for itching or also to help with sleep.  ° °TED HOSE STOCKINGS:  Use stockings on both legs until for at least 2 weeks or as   directed by physician office. They may be removed at night for sleeping.  MEDICATIONS:  See your medication summary on the After Visit Summary that nursing will review with you.  You may have some home medications which will be placed on hold until you complete the course of blood thinner medication.  It is important for you to complete the blood thinner medication as prescribed.  PRECAUTIONS:  If you experience chest pain or shortness of breath - call 911 immediately for transfer to the hospital emergency department.   If you develop a fever greater that 101 F, purulent drainage from wound, increased redness or drainage from wound, foul odor from the wound/dressing, or calf pain - CONTACT YOUR SURGEON.                                                   FOLLOW-UP APPOINTMENTS:  If you do not already have a post-op appointment, please call the office for an appointment to be seen by your surgeon.  Guidelines for how soon to be seen are listed in your After Visit Summary, but are typically between 1-4 weeks after surgery.  OTHER INSTRUCTIONS:   Knee Replacement:  Do not place pillow under knee, focus on keeping the knee straight while resting. CPM  instructions: 0-90 degrees, 2 hours in the morning, 2 hours in the afternoon, and 2 hours in the evening. Place foam block, curve side up under heel at all times except when in CPM or when walking.  DO NOT modify, tear, cut, or change the foam block in any way.  MAKE SURE YOU:   Understand these instructions.   Get help right away if you are not doing well or get worse.    Thank you for letting us be a part of your medical care team.  It is a privilege we respect greatly.  We hope these instructions will help you stay on track for a fast and full recovery!   Information on my medicine - ELIQUIS (apixaban)  This medication education was reviewed with me or my healthcare representative as part of my discharge preparation.  The pharmacist that spoke with me during my hospital stay was:  [place], [place], [facility]  Why was Eliquis prescribed for you? Eliquis was prescribed for you to reduce the risk of blood clots forming after orthopedic surgery.    What do You need to know about Eliquis? Take your Eliquis TWICE DAILY - one tablet in the morning and one tablet in the evening with or without food.  It would be best to take the dose about the same time each day.  If you have difficulty swallowing the tablet whole please discuss with your pharmacist how to take the medication safely.  Take Eliquis exactly as prescribed by your doctor and DO NOT stop taking Eliquis without talking to the doctor who prescribed the medication.  Stopping without other medication to take the place of Eliquis may increase your risk of developing a clot.  After discharge, you should have regular check-up appointments with your healthcare provider that is prescribing your Eliquis.  What do you do if you miss a dose? If a dose of ELIQUIS is not taken at the scheduled time, take it as soon as possible on the same day and twice-daily administration should be resumed.  The dose should not be doubled  to make up  for a missed dose.  Do not take more than one tablet of ELIQUIS at the same time.  Important Safety Information A possible side effect of Eliquis is bleeding. You should call your healthcare provider right away if you experience any of the following: ? Bleeding from an injury or your nose that does not stop. ? Unusual colored urine (red or dark brown) or unusual colored stools (red or black). ? Unusual bruising for unknown reasons. ? A serious fall or if you hit your head (even if there is no bleeding).  Some medicines may interact with Eliquis and might increase your risk of bleeding or clotting while on Eliquis. To help avoid this, consult your healthcare provider or pharmacist prior to using any new prescription or non-prescription medications, including herbals, vitamins, non-steroidal anti-inflammatory drugs (NSAIDs) and supplements.  This website has more information on Eliquis (apixaban): http://www.eliquis.com/eliquis/home

## 2015-02-11 NOTE — Progress Notes (Signed)
OT Cancellation Note  Patient Details Name: Ashley Cooke MRN: [id number] DOB: [birth year]   Cancelled Treatment:    Reason Eval/Treat Not Completed: OT screened, no needs identified, will sign off. Pt reports she had L TKA in September 2016. Has all the equipment she will need at home; supportive spouse that will provide 24/7 supervision. Pt with no questions or concerns for OT at this time. No acute OT needs identified; will sign off at this time. Please re-consult if change in medical status occurs. Thank you for this referral.   Binnie Kand M.S., OTR/L Pager: [phone]  02/11/2015, 10:12 AM

## 2015-02-11 NOTE — Care Management Note (Signed)
Case Management Note  Patient Details  Name: Ashley Cooke MRN: [id number] Date of Birth: [birth year]  Subjective/Objective:    75 yr old female s/p right total knee arthroplasty                Action/Plan: Case manager spoke with patient concerning home health and DME needs at discharge. Patient was preoperatively setup with [facility], no changes. Patient has rolling walker, 3in1 from previous knee surgery. Will have family support at discharge.    Expected Discharge Date:    02/12/15              Expected Discharge Plan:  [place]  In-House Referral:  NA  Discharge planning Services  CM Consult  Post Acute Care Choice:  NA Choice offered to:  Patient  DME Arranged:  N/A DME Agency:  NA  HH Arranged:  PT HH Agency:  [facility]  Status of Service:  Completed, signed off  Medicare Important Message Given:    Date Medicare IM Given:    Medicare IM give by:    Date Additional Medicare IM Given:    Additional Medicare Important Message give by:     If discussed at [place] of Stay Meetings, dates discussed:    Additional Comments:  Ninfa Meeker, RN 02/11/2015, 1:35 PM

## 2015-02-11 NOTE — Progress Notes (Signed)
Physical Therapy Treatment Patient Details Name: Ashley Cooke MRN: [id number] DOB: [birth year] Today's Date: 02/11/2015    History of Present Illness Patient is a 75 yo female admitted 02/10/15 and is s/p Rt TKA.   PMH:  Lt TKA 09/2014, OA, HTN    PT Comments    Patient making improvement with mobility and gait.  Patient reporting Rt thigh pain.  Reports it improved following gait.  Will continue this pm.  Follow Up Recommendations  Home health PT;Supervision for mobility/OOB     Equipment Recommendations  None recommended by PT    Recommendations for Other Services       Precautions / Restrictions Precautions Precautions: Knee Precaution Booklet Issued: Yes (comment) Precaution Comments: Reviewed precautions Required Braces or Orthoses: Knee Immobilizer - Right Knee Immobilizer - Right: On when out of bed or walking (D/C when spinal wears off) Restrictions Weight Bearing Restrictions: Yes RLE Weight Bearing: Weight bearing as tolerated    Mobility  Bed Mobility Overal bed mobility: Needs Assistance Bed Mobility: Sit to Supine       Sit to supine: Min assist   General bed mobility comments: Assist to bring RLE onto bed.  Transfers Overall transfer level: Needs assistance Equipment used: Rolling walker (2 wheeled) Transfers: Sit to/from Stand Sit to Stand: Min guard         General transfer comment: Verbal cues for hand placement.  Min guard for safety from chair, 3-in-1, and bed.  Ambulation/Gait Ambulation/Gait assistance: Min guard Ambulation Distance (Feet): 34 Feet Assistive device: Rolling walker (2 wheeled) Gait Pattern/deviations: Step-to pattern;Decreased stance time - right;Decreased step length - left;Decreased stride length;Decreased weight shift to right;Antalgic Gait velocity: decreased Gait velocity interpretation: Below normal speed for age/gender General Gait Details: Patient demonstrates safe use of RW.  Cues for gait sequence.  Slightly  antalgic gait pattern.  Distance limited due to thigh pain.  Rt knee instability, supported by KI.   Stairs            Wheelchair Mobility    Modified Rankin (Stroke Patients Only)       Balance Overall balance assessment: Needs assistance         Standing balance support: Bilateral upper extremity supported Standing balance-Leahy Scale: Poor                      Cognition Arousal/Alertness: Awake/alert Behavior During Therapy: WFL for tasks assessed/performed Overall Cognitive Status: Within Functional Limits for tasks assessed                      Exercises Total Joint Exercises Ankle Circles/Pumps: AROM;Both;10 reps;Seated Quad Sets: AROM;Right;10 reps;Supine Towel Squeeze: AROM;Both;10 reps;Supine Short Arc Quad: AROM;Right;5 reps;Supine Heel Slides: AROM;Right;5 reps;Supine Hip ABduction/ADduction: AROM;Right;10 reps;Supine Long Arc Quad: AROM;Right;5 reps;Seated Knee Flexion: AROM;Right;5 reps;Seated Goniometric ROM: 20 to 75    General Comments        Pertinent Vitals/Pain Pain Assessment: 0-10 Pain Score: 10-Worst pain ever Pain Location: Rt lateral thigh - intermittent Pain Descriptors / Indicators: Aching;Sharp;Shooting ("Comes and goes") Pain Intervention(s): Monitored during session;Repositioned;Ice applied (Thigh pain improved following ambulation; Ice to knee)    Home Living                      Prior Function            PT Goals (current goals can now be found in the care plan section) Acute Rehab PT Goals Patient Stated Goal: To  return home Progress towards PT goals: Progressing toward goals    Frequency  7X/week    PT Plan Current plan remains appropriate    Co-evaluation             End of Session Equipment Utilized During Treatment: Gait belt;Right knee immobilizer Activity Tolerance: Patient limited by pain Patient left: in bed;with call bell/phone within reach     Time: 0857-0932 PT  Time Calculation (min) (ACUTE ONLY): 35 min  Charges:  $Gait Training: 8-22 mins $Therapeutic Exercise: 8-22 mins                    G Codes:      [name] 02/16/2015, 9:41 AM [name]. [name], [place] Pager [phone]

## 2015-02-12 LAB — CBC
HCT: 31.3 % — ABNORMAL LOW (ref 36.0–46.0)
Hemoglobin: 10.6 g/dL — ABNORMAL LOW (ref 12.0–15.0)
MCH: 31.5 pg (ref 26.0–34.0)
MCHC: 33.9 g/dL (ref 30.0–36.0)
MCV: 93.2 fL (ref 78.0–100.0)
PLATELETS: 199 10*3/uL (ref 150–400)
RBC: 3.36 MIL/uL — ABNORMAL LOW (ref 3.87–5.11)
RDW: 14.8 % (ref 11.5–15.5)
WBC: 9.3 10*3/uL (ref 4.0–10.5)

## 2015-02-12 NOTE — Progress Notes (Signed)
Orthopaedic Trauma Service (OTS)   Subjective: Patient reports pain as mild.  Worked well w/ PT and eager for d/c.  Objective: Temp:  [98.9 F (37.2 C)-99.8 F (37.7 C)] 99.8 F (37.7 C) (01/22 0615) Pulse Rate:  [83-87] 85 (01/22 0615) Resp:  [16-18] 16 (01/22 0615) BP: (114-144)/(54-66) 144/66 mmHg (01/22 0615) SpO2:  [92 %-97 %] 92 % (01/22 0615) Physical Exam  RLE Dressing changed and wound intact, clean, dry  Edema/ swelling controlled  Sens: DPN, SPN, TN intact  Motor: EHL, FHL, and lessor toe ext and flex all intact  Brisk cap refill, warm to touch  Drain d/c'd  Assessment/Plan: 2 Days Post-Op Procedure(s) (LRB): RIGHT TOTAL KNEE ARTHROPLASTY (Right) 1. PT/ OT 2. Eliquis for DVT proph 3. D/c to home  [employer], MD [facility] [phone] (p)

## 2015-02-12 NOTE — Progress Notes (Signed)
Physical Therapy Treatment Patient Details Name: Ashley Cooke MRN: [id number] DOB: 1940/05/23 Today's Date: 02/12/2015    History of Present Illness Patient is a 75 yo female admitted 02/10/15 and is s/p Rt TKA.   PMH:  Lt TKA 09/2014, OA, HTN    PT Comments    Patient making good gains with mobility and gait.  Able to ambulate 120' with RW and supervision.  No pain in Rt thigh today - pain focused at knee.  Patient ready for d/c from PT perspective.  Follow Up Recommendations  Home health PT;Supervision for mobility/OOB     Equipment Recommendations  None recommended by PT    Recommendations for Other Services       Precautions / Restrictions Precautions Precautions: Knee Precaution Comments: Patient recalls precautions Required Braces or Orthoses: Knee Immobilizer - Right Knee Immobilizer - Right: On when out of bed or walking (d/c when spinal wears off) Restrictions Weight Bearing Restrictions: Yes RLE Weight Bearing: Weight bearing as tolerated    Mobility  Bed Mobility               General bed mobility comments: Patient OOB  Transfers Overall transfer level: Needs assistance Equipment used: Rolling walker (2 wheeled) Transfers: Sit to/from Stand Sit to Stand: Supervision         General transfer comment: Supervision for safety from chair and 3-in-1.  Ambulation/Gait Ambulation/Gait assistance: Supervision Ambulation Distance (Feet): 120 Feet Assistive device: Rolling walker (2 wheeled) Gait Pattern/deviations: Step-through pattern;Decreased stance time - right;Decreased step length - left;Decreased stride length Gait velocity: decreased Gait velocity interpretation: Below normal speed for age/gender General Gait Details: Patient demonstrates safe use of RW.  Improved gait pattern and speed today.   Stairs            Wheelchair Mobility    Modified Rankin (Stroke Patients Only)       Balance                                     Cognition Arousal/Alertness: Awake/alert Behavior During Therapy: WFL for tasks assessed/performed Overall Cognitive Status: Within Functional Limits for tasks assessed                      Exercises Total Joint Exercises Ankle Circles/Pumps: AROM;Both;10 reps;Seated Quad Sets: AROM;Right;10 reps;Seated Towel Squeeze: AROM;Both;10 reps;Seated Short Arc Quad: AROM;Right;5 reps;Seated Heel Slides: AROM;Right;10 reps;Seated Hip ABduction/ADduction: AROM;Right;10 reps;Seated Long Arc Quad: AROM;Right;5 reps;Seated Knee Flexion: AROM;Right;10 reps;Seated Goniometric ROM: 15 to 80    General Comments        Pertinent Vitals/Pain Pain Assessment: 0-10 Pain Score: 6  Pain Location: Rt knee Pain Descriptors / Indicators: Aching;Sore Pain Intervention(s): Monitored during session;Repositioned;Patient requesting pain meds-RN notified;RN gave pain meds during session    Home Living                      Prior Function            PT Goals (current goals can now be found in the care plan section) Progress towards PT goals: Progressing toward goals    Frequency  7X/week    PT Plan Current plan remains appropriate    Co-evaluation             End of Session Equipment Utilized During Treatment: Gait belt;Right knee immobilizer Activity Tolerance: Patient tolerated treatment well Patient left: in chair;with call  bell/phone within reach     Time: 1022-1050 PT Time Calculation (min) (ACUTE ONLY): 28 min  Charges:  $Gait Training: 8-22 mins $Therapeutic Exercise: 8-22 mins                    G Codes:      Ashley Cooke February 20, 2015, 10:57 AM Ashley Cooke. Ashley Cooke, Ashley Cooke Pager [phone]

## 2015-02-12 NOTE — Discharge Summary (Signed)
[facility] (OTS)  Patient ID: SHAKARI MARSE MRN: [id number] DOB/AGE: [birth year] 75 y.o.  Admit date: 02/10/2015 Discharge date: 02/12/2015  Admission Diagnoses: End stage djd R knee HTN HRT   Discharge Diagnoses:  Principal Problem:   Primary localized osteoarthritis of right knee Active Problems:   Postmenopausal hormone replacement therapy   Essential hypertension   Procedures Performed: 02/10/2015- Dr. French Ana Right total knee replacement (Sigma cemented knee with size 3 femur, tibia 12.5 mm bearing and 35-mm all poly patella).   Discharged Condition: good  Hospital Course:   75 year old female status post right TKA on 02/10/2015. Patient was admitted to the hospital on the day of surgery. Her hospital course was uncomplicated. Patient progressed very well during her stay. No issues were noted during her hospitalization. Patient worked well with physical therapy on postoperative day #1. She was also on her CPM immediately postoperatively and tolerated this for about 6 hours. She continue progress well over the next several days it was deemed stable for discharge on postoperative day #2. Her dressing was changed on postoperative day #2 and did not demonstrate any concerns. Her Hemovac drain was also removed at that time as well. Patient was started on eliquis for dvt/pe prophylaxis post op as well. Patient to be discharged home in stable condition  Consults: None  Significant Diagnostic Studies: labs:  Results for Sivi, [facility] (MRN [id number]) as of 02/12/2015 12:44  Ref. Range 02/12/2015 05:55  WBC Latest Ref Range: 4.0-10.5 K/uL 9.3  RBC Latest Ref Range: 3.87-5.11 MIL/uL 3.36 (L)  Hemoglobin Latest Ref Range: 12.0-15.0 g/dL 10.6 (L)  HCT Latest Ref Range: 36.0-46.0 % 31.3 (L)  MCV Latest Ref Range: 78.0-100.0 fL 93.2  MCH Latest Ref Range: 26.0-34.0 pg 31.5  MCHC Latest Ref Range: 30.0-36.0 g/dL 33.9  RDW Latest Ref Range: 11.5-15.5 % 14.8  Platelets  Latest Ref Range: 150-400 K/uL 199    Treatments: IV hydration, antibiotics: Ancef, analgesia: Dilaudid and oxycodone, anticoagulation: eliquis, therapies: PT, OT and RN and surgery: as above  Discharge Exam:   Orthopaedic Trauma Service (OTS)   Subjective: Patient reports pain as mild.  Worked well w/ PT and eager for d/c.  Objective: Temp:  [98.9 F (37.2 C)-99.8 F (37.7 C)] 99.8 F (37.7 C) (01/22 0615) Pulse Rate:  [83-87] 85 (01/22 0615) Resp:  [16-18] 16 (01/22 0615) BP: (114-144)/(54-66) 144/66 mmHg (01/22 0615) SpO2:  [92 %-97 %] 92 % (01/22 0615) Physical Exam  RLE     Dressing changed and wound intact, clean, dry             Edema/ swelling controlled             Sens: DPN, SPN, TN intact             Motor: EHL, FHL, and lessor toe ext and flex all intact             Brisk cap refill, warm to touch             Drain d/c'd  Assessment/Plan: 2 Days Post-Op Procedure(s) (LRB): RIGHT TOTAL KNEE ARTHROPLASTY (Right) 1. PT/ OT 2. Eliquis for DVT proph 3. D/c to home  Altamese Davenport, MD [facility], [facility] [phone] (p)     Disposition: 06-Home-Health Care Svc     Medication List    STOP taking these medications        ALEVE 220 MG tablet  Generic drug:  naproxen sodium  aspirin 325 MG tablet      TAKE these medications        acetaminophen 650 MG CR tablet  Commonly known as:  TYLENOL  Take 650 mg by mouth 2 (two) times daily as needed for pain.     apixaban 2.5 MG Tabs tablet  Commonly known as:  ELIQUIS  Take 1 tablet (2.5 mg total) by mouth 2 (two) times daily.     atorvastatin 10 MG tablet  Commonly known as:  LIPITOR  Take 10 mg by mouth at bedtime.     CALCIUM 600+D 600-800 MG-UNIT Tabs  Generic drug:  Calcium Carb-Cholecalciferol  Take 1 tablet by mouth daily.     cholecalciferol 1000 units tablet  Commonly known as:  VITAMIN D  Take 1,000 Units by mouth daily.     famotidine 20 MG tablet   Commonly known as:  PEPCID  Take 20 mg by mouth 2 (two) times daily as needed (take with every aleve dose).     loratadine 10 MG tablet  Commonly known as:  CLARITIN  Take 10 mg by mouth daily as needed (seasonal allergies).     losartan 100 MG tablet  Commonly known as:  COZAAR  Take 50 mg by mouth Daily.     multivitamin with minerals Tabs tablet  Take 1 tablet by mouth daily.     OVER THE COUNTER MEDICATION  Place 1 drop into both eyes 5 (five) times daily as needed (dry eyes). Over the counter lubricating eye drop     oxyCODONE 5 MG immediate release tablet  Commonly known as:  ROXICODONE  1-2 tabs po q4-6hrs prn pain     vitamin C 500 MG tablet  Commonly known as:  ASCORBIC ACID  Take 500 mg by mouth daily.           Follow-up Information    Follow up with CAFFREY JR,W D, MD. Schedule an appointment as soon as possible for a visit in 2 weeks.   Specialty:  Orthopedic Surgery   Contact information:   [place] [address] [phone]       Follow up with [facility].   Why:  Someone from [facility] will contact you concerning start date and time for therapy.   Contact information:   [place] [address] [phone]        Signed:  Jari Pigg, PA-C [facility] [phone] (P) 02/12/2015, 12:42 PM

## 2015-02-13 ENCOUNTER — Encounter (HOSPITAL_COMMUNITY): Payer: Self-pay | Admitting: Orthopedic Surgery

## 2015-02-28 ENCOUNTER — Encounter (HOSPITAL_COMMUNITY): Payer: Self-pay | Admitting: Orthopedic Surgery

## 2015-04-25 DIAGNOSIS — I77819 Aortic ectasia, unspecified site: Secondary | ICD-10-CM | POA: Insufficient documentation

## 2015-09-04 ENCOUNTER — Other Ambulatory Visit: Payer: Self-pay | Admitting: Nurse Practitioner

## 2015-09-04 DIAGNOSIS — Z1231 Encounter for screening mammogram for malignant neoplasm of breast: Secondary | ICD-10-CM

## 2015-10-06 ENCOUNTER — Ambulatory Visit
Admission: RE | Admit: 2015-10-06 | Discharge: 2015-10-06 | Disposition: A | Discharge status: Home / Self Care | Payer: Medicare Other | Source: Ambulatory Visit | Attending: Nurse Practitioner | Admitting: Nurse Practitioner

## 2015-10-06 DIAGNOSIS — Z1231 Encounter for screening mammogram for malignant neoplasm of breast: Secondary | ICD-10-CM

## 2015-10-27 ENCOUNTER — Encounter: Payer: Self-pay | Admitting: Nurse Practitioner

## 2015-10-27 ENCOUNTER — Ambulatory Visit (INDEPENDENT_AMBULATORY_CARE_PROVIDER_SITE_OTHER): Payer: Medicare Other | Admitting: Nurse Practitioner

## 2015-10-27 VITALS — BP 108/66 | HR 68 | Ht 62.75 in | Wt 155.0 lb

## 2015-10-27 DIAGNOSIS — M1990 Unspecified osteoarthritis, unspecified site: Secondary | ICD-10-CM | POA: Diagnosis not present

## 2015-10-27 DIAGNOSIS — Z01419 Encounter for gynecological examination (general) (routine) without abnormal findings: Secondary | ICD-10-CM

## 2015-10-27 DIAGNOSIS — I10 Essential (primary) hypertension: Secondary | ICD-10-CM

## 2015-10-27 DIAGNOSIS — Z79890 Hormone replacement therapy (postmenopausal): Secondary | ICD-10-CM | POA: Diagnosis not present

## 2015-10-27 NOTE — Progress Notes (Signed)
Patient ID: Ashley Cooke, female   DOB: [birth year], 75 y.o.   MRN: [id number]  75 y.o. [id number] Married  Caucasian Fe here for annual exam.   Had her second knee replacement in January on the right.  Previous one on the left 9/16.   She is doing well, now just  walking, exercise in the pool and tennis but not skiing this year.   She had a positive hemoccult and will be seeing GI next week at [facility].   No vaginal dryness   Patient's last menstrual period was 01/21/1977 (approximate).          Sexually active: Yes.    The current method of family planning is post menopausal status.    Exercising: Yes.   tennis, swimming, gym, walking and tai chi Smoker:  no  Health Maintenance: Pap: 10/12/14, Negative  MMG: 10/06/15, Bi-Rads 1:Negative  Colonoscopy: 03/15/11, Normal, repeat in 10 years BMD: 2016 with Dr. Osborne Casco, normal TDaP: 10/2008  Shingles 2007 Pneumonia: 2015 Prevnar 13 Hep C and HIV: not indicated due  Labs: PCP takes care of all labs   reports that she quit smoking about 36 years ago. Her smoking use included Cigarettes. She has a 11.00 pack-year smoking history. She has never used smokeless tobacco. She reports that she drinks about 4.2 oz of alcohol per week . She reports that she does not use drugs.  Past Medical History:  Diagnosis Date  . Arthritis    "primarily in my knee & thumbs" (10/15/2014)  . Hypercholesterolemia   . Hypertension   . Melanoma of lower leg ([place]) 05/2003   "left"  . Seasonal allergies   . Squamous carcinoma    "all on the left side:  under eye, nostril, bicept"    Past Surgical History:  Procedure Laterality Date  . DILATION AND CURETTAGE OF UTERUS  1968  . JOINT REPLACEMENT    . KNEE ARTHROSCOPY Left 05/06/2012   torn miniscus  . MELANOMA EXCISION Left 05/2003   leg  . MOHS SURGERY Left 2000's   "squamous; under eye and nostril"  . SQUAMOUS CELL CARCINOMA EXCISION Left 2008   upper bicept  . TONSILLECTOMY AND ADENOIDECTOMY  1949  . TOTAL  KNEE ARTHROPLASTY Left 10/14/2014  . TOTAL KNEE ARTHROPLASTY Left 10/14/2014   Procedure: TOTAL KNEE ARTHROPLASTY;  Surgeon: Earlie Server, MD;  Location: [facility];  Service: Orthopedics;  Laterality: Left;  . TOTAL KNEE ARTHROPLASTY Right 02/10/2015   Procedure: RIGHT TOTAL KNEE ARTHROPLASTY;  Surgeon: Earlie Server, MD;  Location: [facility];  Service: Orthopedics;  Laterality: Right;    Current Outpatient Prescriptions  Medication Sig Dispense Refill  . acetaminophen (TYLENOL) 650 MG CR tablet Take 650 mg by mouth 2 (two) times daily as needed for pain.    Marland Kitchen atorvastatin (LIPITOR) 10 MG tablet Take 10 mg by mouth at bedtime.     . Calcium Carb-Cholecalciferol (CALCIUM 600+D) 600-800 MG-UNIT TABS Take 1 tablet by mouth daily.    . cholecalciferol (VITAMIN D) 1000 UNITS tablet Take 1,000 Units by mouth daily.     . famotidine (PEPCID) 20 MG tablet Take 20 mg by mouth 2 (two) times daily as needed (take with every aleve dose).     Marland Kitchen loratadine (CLARITIN) 10 MG tablet Take 10 mg by mouth daily as needed (seasonal allergies).     . losartan (COZAAR) 100 MG tablet Take 50 mg by mouth Daily.    . Multiple Vitamin (MULTIVITAMIN WITH MINERALS) TABS tablet Take 1 tablet  by mouth daily.    . Naproxen Sodium (ALEVE) 220 MG CAPS Take 1 capsule by mouth 2 (two) times daily.    Marland Kitchen OVER THE COUNTER MEDICATION Place 1 drop into both eyes 5 (five) times daily as needed (dry eyes). Over the counter lubricating eye drop    . vitamin C (ASCORBIC ACID) 500 MG tablet Take 500 mg by mouth daily.      No current facility-administered medications for this visit.     Family History  Problem Relation Age of Onset  . Hypertension Mother   . Hypertension Sister 16  . Peripheral Artery Disease Sister   . Hypertension Maternal Grandfather   . Colon cancer Neg Hx     ROS:  Pertinent items are noted in HPI.  Otherwise, a comprehensive ROS was negative.  Exam:   BP 108/66 (BP Location: Right Arm, Patient Position:  Sitting, Cuff Size: Normal)   Pulse 68   Ht 5' 2.75" (1.594 m)   Wt 155 lb (70.3 kg)   LMP 01/21/1977 (Approximate)   BMI 27.68 kg/m  Height: 5' 2.75" (159.4 cm) Ht Readings from Last 3 Encounters:  10/27/15 5' 2.75" (1.594 m)  02/10/15 '5\' 3"'  (1.6 m)  01/31/15 '5\' 3"'  (1.6 m)    General appearance: alert, cooperative and appears stated age Head: Normocephalic, without obvious abnormality, atraumatic Neck: no adenopathy, supple, symmetrical, trachea midline and thyroid normal to inspection and palpation Lungs: clear to auscultation bilaterally Breasts: normal appearance, no masses or tenderness Heart: regular rate and rhythm Abdomen: soft, non-tender; no masses,  no organomegaly Extremities: extremities normal, atraumatic, no cyanosis or edema Skin: Skin color, texture, turgor normal. No rashes or lesions Lymph nodes: Cervical, supraclavicular, and axillary nodes normal. No abnormal inguinal nodes palpated Neurologic: Grossly normal   Pelvic: External genitalia:  no lesions              Urethra:  normal appearing urethra with no masses, tenderness or lesions              Bartholin's and Skene's: normal                 Vagina: normal appearing vagina with normal color and discharge, no lesions              Cervix: anteverted              Pap taken: No. Bimanual Exam:  Uterus:  normal size, contour, position, consistency, mobility, non-tender              Adnexa: no mass, fullness, tenderness               Rectovaginal: Confirms               Anus:  normal sphincter tone, no lesions  Chaperone present: no  A:  Well Woman with normal exam  Postmenopausal on HRT since about 1981 - 09/2013 History of elevated cholesterol  HTN, OA  Now replacement of left knee 9/16 & right knee 1/17 - doing well.  Positive Hemoccult at PCP   P:   Reviewed health and wellness pertinent to exam  Pap smear as above  Mammogram is due 9/18  Encouraged to follow with  GI  Counseled on breast self exam, mammography screening, adequate intake of calcium and vitamin D, diet and exercise, Kegel's exercises return annually or prn  An After Visit Summary was printed and given to the patient.

## 2015-10-27 NOTE — Patient Instructions (Signed)

## 2015-10-31 NOTE — Progress Notes (Signed)
Encounter reviewed by Dr. Emely Fahy Amundson C. Silva.  

## 2015-11-02 ENCOUNTER — Ambulatory Visit (INDEPENDENT_AMBULATORY_CARE_PROVIDER_SITE_OTHER): Payer: Medicare Other | Admitting: Nurse Practitioner

## 2015-11-02 ENCOUNTER — Encounter: Payer: Self-pay | Admitting: Nurse Practitioner

## 2015-11-02 VITALS — BP 122/68 | HR 79 | Ht 63.5 in | Wt 155.0 lb

## 2015-11-02 DIAGNOSIS — R195 Other fecal abnormalities: Secondary | ICD-10-CM

## 2015-11-02 MED ORDER — NA SULFATE-K SULFATE-MG SULF 17.5-3.13-1.6 GM/177ML PO SOLN: 1.0000 | Freq: Once | ORAL | 0 refills | Status: AC

## 2015-11-02 NOTE — Patient Instructions (Addendum)
You have been scheduled for a colonoscopy. Please follow written instructions given to you at your visit today.  Please pick up your prep supplies at the pharmacy within the next 1-3 days. [facility] [address] , [place]  If you use inhalers (even only as needed), please bring them with you on the day of your procedure. Your physician has requested that you go to www.startemmi.com and enter the access code given to you at your visit today. This web site gives a general overview about your procedure. However, you should still follow specific instructions given to you by our office regarding your preparation for the procedure.

## 2015-11-02 NOTE — Progress Notes (Signed)
HPI:  Patient is a 75 year old female, previously followed by Dr. Olevia Perches. She had a normal screening colonoscopy February 2013, surveillance exam recommended at 10 years. Patient referred by PCP, Dr. Domenick Gong, for positive IFOB done as part of routine physical.  Patient has no overt bleeding. No bowel changes other than a recent upset stomach after visiting her grandson. Her weight is stable..    Past Medical History:  Diagnosis Date  . Arthritis    "primarily in my knee & thumbs" (10/15/2014)  . Hypercholesterolemia   . Hypertension   . Melanoma of lower leg ([place]) 05/2003   "left"  . Seasonal allergies   . Squamous carcinoma    "all on the left side:  under eye, nostril, bicept"     Past Surgical History:  Procedure Laterality Date  . DILATION AND CURETTAGE OF UTERUS  1968  . JOINT REPLACEMENT    . KNEE ARTHROSCOPY Left 05/06/2012   torn miniscus  . MELANOMA EXCISION Left 05/2003   leg  . MOHS SURGERY Left 2000's   "squamous; under eye and nostril"  . SQUAMOUS CELL CARCINOMA EXCISION Left 2008   upper bicept  . TONSILLECTOMY AND ADENOIDECTOMY  1949  . TOTAL KNEE ARTHROPLASTY Left 10/14/2014  . TOTAL KNEE ARTHROPLASTY Left 10/14/2014   Procedure: TOTAL KNEE ARTHROPLASTY;  Surgeon: Earlie Server, MD;  Location: [facility];  Service: Orthopedics;  Laterality: Left;  . TOTAL KNEE ARTHROPLASTY Right 02/10/2015   Procedure: RIGHT TOTAL KNEE ARTHROPLASTY;  Surgeon: Earlie Server, MD;  Location: [facility];  Service: Orthopedics;  Laterality: Right;   Family History  Problem Relation Age of Onset  . Hypertension Mother   . Hypertension Sister 10  . Peripheral Artery Disease Sister   . Hypertension Maternal Grandfather   . Colon cancer Neg Hx    Social History  Substance Use Topics  . Smoking status: Former Smoker    Packs/day: 0.50    Years: 22.00    Types: Cigarettes    Quit date: 06/22/1979  . Smokeless tobacco: Never Used  . Alcohol use 4.2 oz/week    7 Glasses of  wine per week   Current Outpatient Prescriptions  Medication Sig Dispense Refill  . acetaminophen (TYLENOL) 650 MG CR tablet Take 650 mg by mouth 2 (two) times daily as needed for pain.    Marland Kitchen aspirin EC 81 MG tablet Take 81 mg by mouth daily.    Marland Kitchen atorvastatin (LIPITOR) 10 MG tablet Take 10 mg by mouth at bedtime.     . Calcium Carb-Cholecalciferol (CALCIUM 600+D) 600-800 MG-UNIT TABS Take 1 tablet by mouth daily.    . cholecalciferol (VITAMIN D) 1000 UNITS tablet Take 1,000 Units by mouth daily.     . famotidine (PEPCID) 20 MG tablet Take 20 mg by mouth daily.     Marland Kitchen loratadine (CLARITIN) 10 MG tablet Take 10 mg by mouth daily as needed (seasonal allergies).     . losartan (COZAAR) 100 MG tablet Take 50 mg by mouth Daily.    . Multiple Vitamin (MULTIVITAMIN WITH MINERALS) TABS tablet Take 1 tablet by mouth daily.    . Naproxen Sodium (ALEVE) 220 MG CAPS Take 1 capsule by mouth 2 (two) times daily.    Marland Kitchen OVER THE COUNTER MEDICATION Place 1 drop into both eyes 5 (five) times daily as needed (dry eyes). Over the counter lubricating eye drop    . vitamin C (ASCORBIC ACID) 500 MG tablet Take 500 mg by mouth daily.  No current facility-administered medications for this visit.    No Known Allergies   Review of Systems:  All systems reviewed and negative except where noted in HPI.   Physical Exam: BP 122/68   Pulse 79   Ht 5' 3.5" (1.613 m)   Wt 155 lb (70.3 kg)   LMP 01/21/1977 (Approximate)   SpO2 99%   BMI 27.03 kg/m  Constitutional: Pleasant,well-developed, white female in no acute distress. HEENT: Normocephalic and atraumatic. Conjunctivae are normal. No scleral icterus. Neck supple.  Cardiovascular: Normal rate, regular rhythm.  Pulmonary/chest: Effort normal and breath sounds normal. No wheezing, rales or rhonchi. Abdominal: Soft, nondistended, nontender. Bowel sounds active throughout. There are no masses palpable. No hepatomegaly. Extremities: no edema Lymphadenopathy: No  cervical adenopathy noted. Neurological: Alert and oriented to person place and time. Skin: Skin is warm and dry. No rashes noted. Psychiatric: Normal mood and affect. Behavior is normal.   ASSESSMENT AND PLAN: 75 year old female referred for positive IFOB done as part of routine physical. No overt bleeding, bowel changes or other alarm symptoms. She had a normal screening colonoscopy Feb 2013. Patient would like to proceed with a colonoscopy for evaluation of this positive IFOB.The risks and benefits of colonoscopy with possibly polypectomy were discussed and the patient agrees to proceed.   T[place], Fransico Him, MD

## 2015-11-07 NOTE — Progress Notes (Signed)
Agree with assessment and plan as outlined.  

## 2015-11-09 ENCOUNTER — Encounter: Payer: Self-pay | Admitting: Gastroenterology

## 2015-11-20 ENCOUNTER — Encounter: Payer: Self-pay | Admitting: Gastroenterology

## 2015-11-20 ENCOUNTER — Ambulatory Visit (AMBULATORY_SURGERY_CENTER): Payer: Medicare Other | Admitting: Gastroenterology

## 2015-11-20 VITALS — BP 150/79 | HR 62 | Temp 96.2°F | Resp 12 | Ht 63.5 in | Wt 155.0 lb

## 2015-11-20 DIAGNOSIS — K6389 Other specified diseases of intestine: Secondary | ICD-10-CM | POA: Diagnosis not present

## 2015-11-20 DIAGNOSIS — K5520 Angiodysplasia of colon without hemorrhage: Secondary | ICD-10-CM

## 2015-11-20 DIAGNOSIS — D122 Benign neoplasm of ascending colon: Secondary | ICD-10-CM | POA: Diagnosis not present

## 2015-11-20 DIAGNOSIS — R195 Other fecal abnormalities: Secondary | ICD-10-CM | POA: Diagnosis present

## 2015-11-20 MED ORDER — SODIUM CHLORIDE 0.9 % IV SOLN: 500.0000 mL | INTRAVENOUS | Status: DC

## 2015-11-20 NOTE — Patient Instructions (Signed)
YOU HAD AN ENDOSCOPIC PROCEDURE TODAY AT THE [facility]:   Refer to the procedure report that was given to you for any specific questions about what was found during the examination.  If the procedure report does not answer your questions, please call your gastroenterologist to clarify.  If you requested that your care partner not be given the details of your procedure findings, then the procedure report has been included in a sealed envelope for you to review at your convenience later.  YOU SHOULD EXPECT: Some feelings of bloating in the abdomen. Passage of more gas than usual.  Walking can help get rid of the air that was put into your GI tract during the procedure and reduce the bloating. If you had a lower endoscopy (such as a colonoscopy or flexible sigmoidoscopy) you may notice spotting of blood in your stool or on the toilet paper. If you underwent a bowel prep for your procedure, you may not have a normal bowel movement for a few days.  Please Note:  You might notice some irritation and congestion in your nose or some drainage.  This is from the oxygen used during your procedure.  There is no need for concern and it should clear up in a day or so.  SYMPTOMS TO REPORT IMMEDIATELY:   Following lower endoscopy (colonoscopy or flexible sigmoidoscopy):  Excessive amounts of blood in the stool  Significant tenderness or worsening of abdominal pains  Swelling of the abdomen that is new, acute  Fever of 100F or higher    For urgent or emergent issues, a gastroenterologist can be reached at any hour by calling [phone].   DIET:  We do recommend a small meal at first, but then you may proceed to your regular diet.  Drink plenty of fluids but you should avoid alcoholic beverages for 24 hours.  ACTIVITY:  You should plan to take it easy for the rest of today and you should NOT DRIVE or use heavy machinery until tomorrow (because of the sedation medicines used during the test).     FOLLOW UP: Our staff will call the number listed on your records the next business day following your procedure to check on you and address any questions or concerns that you may have regarding the information given to you following your procedure. If we do not reach you, we will leave a message.  However, if you are feeling well and you are not experiencing any problems, there is no need to return our call.  We will assume that you have returned to your regular daily activities without incident.  If any biopsies were taken you will be contacted by phone or by letter within the next 1-3 weeks.  Please call us at [phone] if you have not heard about the biopsies in 3 weeks.    SIGNATURES/CONFIDENTIALITY: You and/or your care partner have signed paperwork which will be entered into your electronic medical record.  These signatures attest to the fact that that the information above on your After Visit Summary has been reviewed and is understood.  Full responsibility of the confidentiality of this discharge information lies with you and/or your care-partner.   INFORMATION ON POLYPS,DIVERTICULOSIS,AND HEMORRHOIDS  GIVEN TO YOU TODAY  

## 2015-11-20 NOTE — Progress Notes (Signed)
Report to PACU, RN, vss, BBS= Clear.  

## 2015-11-20 NOTE — Op Note (Signed)
[place] Patient Name: Ashley Cooke Procedure Date: 11/20/2015 10:11 AM MRN: [id number] Endoscopist: Remo Lipps P. Armbruster MD, MD Age: 75 Referring MD:  Date of Birth: January 28, 1940 Gender: Female Account #: [id number] Procedure:                Colonoscopy Indications:              Heme positive stool, normal colonoscopy 2013 Medicines:                Monitored Anesthesia Care Procedure:                Pre-Anesthesia Assessment:                           - Prior to the procedure, a History and Physical                            was performed, and patient medications and                            allergies were reviewed. The patient's tolerance of                            previous anesthesia was also reviewed. The risks                            and benefits of the procedure and the sedation                            options and risks were discussed with the patient.                            All questions were answered, and informed consent                            was obtained. Prior Anticoagulants: The patient has                            taken aspirin, last dose was 1 day prior to                            procedure. ASA Grade Assessment: II - A patient                            with mild systemic disease. After reviewing the                            risks and benefits, the patient was deemed in                            satisfactory condition to undergo the procedure.                           After obtaining informed consent, the colonoscope  was passed under direct vision. Throughout the                            procedure, the patient's blood pressure, pulse, and                            oxygen saturations were monitored continuously. The                            Model PCF-[id number]) scope was introduced                            through the anus and advanced to the the terminal                            ileum,  with identification of the appendiceal                            orifice and IC valve. The colonoscopy was performed                            without difficulty. The patient tolerated the                            procedure well. The quality of the bowel                            preparation was good. The terminal ileum, ileocecal                            valve, appendiceal orifice, and rectum were                            photographed. Scope In: 10:26:08 AM Scope Out: 10:44:09 AM Scope Withdrawal Time: 0 hours 15 minutes 22 seconds  Total Procedure Duration: 0 hours 18 minutes 1 second  Findings:                 The perianal and digital rectal examinations were                            normal.                           A single medium-sized angiodysplastic lesion                            without bleeding was found in the cecum.                           A 3 mm polyp was found in the ascending colon. The                            polyp was flat. The polyp was removed with a cold  biopsy forceps. Resection and retrieval were                            complete.                           Multiple medium-mouthed diverticula were found in                            the left colon, with associated narrowed lumen in                            this area. This part of the colon took several                            minutes to evaluate given poor air retension in the                            area.                           Non-bleeding internal hemorrhoids were found during                            retroflexion.                           The terminal ileum appeared normal.                           The exam was otherwise without abnormality. Complications:            No immediate complications. Estimated blood loss:                            Minimal. Estimated Blood Loss:     Estimated blood loss was minimal. Impression:               - A single  non-bleeding colonic angiodysplastic                            lesion.                           - One 3 mm polyp in the ascending colon, removed                            with a cold biopsy forceps. Resected and retrieved.                           - Diverticulosis in the left colon.                           - Non-bleeding internal hemorrhoids.                           - The examined portion of the ileum was normal.                           -  The examination was otherwise normal.                           Overall, suspect (+) FOBT is due to hemorrhoids. Do                            not recommend any further stool testing for this                            patient. Recommendation:           - Patient has a contact number available for                            emergencies. The signs and symptoms of potential                            delayed complications were discussed with the                            patient. Return to normal activities tomorrow.                            Written discharge instructions were provided to the                            patient.                           - Resume previous diet.                           - Continue present medications.                           - Await pathology results.                           - Consider repeat colonoscopy for surveillance                            based on pathology results. Remo Lipps P. Armbruster MD, MD 11/20/2015 10:50:09 AM This report has been signed electronically.

## 2015-11-21 ENCOUNTER — Telehealth: Payer: Self-pay | Admitting: *Deleted

## 2015-11-21 NOTE — Telephone Encounter (Signed)
  Follow up Call-  Call back number 11/20/2015  Post procedure Call Back phone  # [phone]  Permission to leave phone message Yes  Some recent data might be hidden     Patient questions:  Do you have a fever, pain , or abdominal swelling? No. Pain Score  0 *  Have you tolerated food without any problems? Yes.    Have you been able to return to your normal activities? Yes.    Do you have any questions about your discharge instructions: Diet   No. Medications  No. Follow up visit  No.  Do you have questions or concerns about your Care? No.  Actions: * If pain score is 4 or above: No action needed, pain <4.

## 2015-11-23 ENCOUNTER — Encounter: Payer: Self-pay | Admitting: Gastroenterology

## 2015-12-29 ENCOUNTER — Ambulatory Visit (INDEPENDENT_AMBULATORY_CARE_PROVIDER_SITE_OTHER): Payer: Medicare Other | Admitting: Nurse Practitioner

## 2015-12-29 ENCOUNTER — Encounter: Payer: Self-pay | Admitting: Nurse Practitioner

## 2015-12-29 VITALS — BP 124/80 | HR 60 | Ht 63.5 in | Wt 157.0 lb

## 2015-12-29 DIAGNOSIS — R350 Frequency of micturition: Secondary | ICD-10-CM

## 2015-12-29 DIAGNOSIS — R3915 Urgency of urination: Secondary | ICD-10-CM

## 2015-12-29 DIAGNOSIS — N760 Acute vaginitis: Secondary | ICD-10-CM | POA: Diagnosis not present

## 2015-12-29 LAB — POCT URINALYSIS DIPSTICK
Bilirubin, UA: NEGATIVE
GLUCOSE UA: NEGATIVE
KETONES UA: NEGATIVE
Nitrite, UA: NEGATIVE
Protein, UA: NEGATIVE
RBC UA: NEGATIVE
UROBILINOGEN UA: NEGATIVE
pH, UA: 5

## 2015-12-29 MED ORDER — TRIAMCINOLONE ACETONIDE 0.025 % EX OINT: TOPICAL_OINTMENT | CUTANEOUS | 0 refills | Status: DC

## 2015-12-29 MED ORDER — NYSTATIN 100000 UNIT/GM EX CREA: TOPICAL_CREAM | CUTANEOUS | 0 refills | Status: DC

## 2015-12-29 MED ORDER — FLUCONAZOLE 150 MG PO TABS: 150.0000 mg | ORAL_TABLET | Freq: Once | ORAL | 0 refills | Status: AC

## 2015-12-29 NOTE — Progress Notes (Signed)
75 y.o. Married Caucasian female [phone] here with complaint of vaginal symptoms of itching, burning, and slight increase discharge. Describes discharge as mostly clear.  Symptoms are mostly external.  Has not been using pads.  Onset of symptoms 2 days ago. Denies new personal products.  Urinary symptoms slight urgency and frequency.  She was seen at [facility] on Tuesday - report shows urine micro WBC TNTC, negative RBC, many bacteria.  Denies fever and chill and flank pain.  Feels most of symptoms are external.   O: Healthy female WDWN Affect: normal, orientation x 3  Exam: no distress Abdomen:soft and non tender without flank pain Lymph node: no enlargement or tenderness Pelvic exam: External genital: normal female with redness and irritation of both labia with redness to the perianal area. BUS: negative Vagina: thin clear discharge noted.  Affirm is not taken Adnexa:normal,  Tender at the urethra and slight tender at the bladder, no masses or fullness noted   A: Vaginitis - external irritation  Most likely external yeast  R/O UTI - prefers to wait on urine C&S   Urine chemstrip: 2+ leuk's trace protein, ph 5.0  P:Discussed findings of external vaginitis and etiology. Discussed Aveeno or baking soda sitz bath for comfort. Avoid moist clothes or pads for extended period of time. If working out in gym clothes or swim suits for long periods of time change underwear or bottoms of swimsuit if possible. Coconut Oil use for skin protection prior to activity can be used to external skin for protection or dryness.  Rx: Diflucan 150 mg X 2  Triamcinolone and Nystatin mix for external symptoms  Will follow with urine C&S - not enough for micro  Instructions to call if symptoms of UTI gets worse.  RV prn

## 2015-12-29 NOTE — Patient Instructions (Signed)
Vaginal Yeast infection, Adult Vaginal yeast infection is a condition that causes soreness, swelling, and redness (inflammation) of the vagina. It also causes vaginal discharge. This is a common condition. Some women get this infection frequently. What are the causes? This condition is caused by a change in the normal balance of the yeast (candida) and bacteria that live in the vagina. This change causes an overgrowth of yeast, which causes the inflammation. What increases the risk? This condition is more likely to develop in:  Women who take antibiotic medicines.  Women who have diabetes.  Women who take birth control pills.  Women who are pregnant.  Women who douche often.  Women who have a weak defense (immune) system.  Women who have been taking steroid medicines for a long time.  Women who frequently wear tight clothing. What are the signs or symptoms? Symptoms of this condition include:  White, thick vaginal discharge.  Swelling, itching, redness, and irritation of the vagina. The lips of the vagina (vulva) may be affected as well.  Pain or a burning feeling while urinating.  Pain during sex. How is this diagnosed? This condition is diagnosed with a medical history and physical exam. This will include a pelvic exam. Your health care provider will examine a sample of your vaginal discharge under a microscope. Your health care provider may send this sample for testing to confirm the diagnosis. How is this treated? This condition is treated with medicine. Medicines may be over-the-counter or prescription. You may be told to use one or more of the following:  Medicine that is taken orally.  Medicine that is applied as a cream.  Medicine that is inserted directly into the vagina (suppository). Follow these instructions at home:  Take or apply over-the-counter and prescription medicines only as told by your health care provider.  Do not have sex until your health care  provider has approved. Tell your sex partner that you have a yeast infection. That person should go to his or her health care provider if he or she develops symptoms.  Do not wear tight clothes, such as pantyhose or tight pants.  Avoid using tampons until your health care provider approves.  Eat more yogurt. This may help to keep your yeast infection from returning.  Try taking a sitz bath to help with discomfort. This is a warm water bath that is taken while you are sitting down. The water should only come up to your hips and should cover your buttocks. Do this 3-4 times per day or as told by your health care provider.  Do not douche.  Wear breathable, cotton underwear.  If you have diabetes, keep your blood sugar levels under control. Contact a health care provider if:  You have a fever.  Your symptoms go away and then return.  Your symptoms do not get better with treatment.  Your symptoms get worse.  You have new symptoms.  You develop blisters in or around your vagina.  You have blood coming from your vagina and it is not your menstrual period.  You develop pain in your abdomen. This information is not intended to replace advice given to you by your health care provider. Make sure you discuss any questions you have with your health care provider. Document Released: 10/17/2004 Document Revised: 06/21/2015 Document Reviewed: 07/11/2014 Elsevier Interactive Patient Education  2017 [employer]   Take Diflucan 150 mg today and repeat on Sunday at lunch  Mix the  Triamcinolone and Nystatin cream together -pea size  amount and use twice a day

## 2015-12-31 ENCOUNTER — Telehealth: Payer: Self-pay | Admitting: Nurse Practitioner

## 2015-12-31 LAB — URINE CULTURE

## 2015-12-31 NOTE — Progress Notes (Signed)
Encounter reviewed by Dr. Brook Amundson C. Silva.  

## 2015-12-31 NOTE — Telephone Encounter (Signed)
Per DPR OK to leave a detailed message on the home phone number:  She was given the information about urine culture results with vaginal contamination and no further test on urine was indicated.  She was to finish the treatment given for yeast which was Diflucan, Triamcinolone mixed with Nystatin.  I will still have nurse to call her and confirm the message since she specifically requested a call.

## 2016-01-01 NOTE — Telephone Encounter (Signed)
Reviewed results with patient.  She has taken both Diflucan.  Questions answered.  Patient will call office if symptoms persist.

## 2016-02-20 ENCOUNTER — Ambulatory Visit: Payer: Medicare Other | Admitting: Nurse Practitioner

## 2016-05-07 ENCOUNTER — Encounter: Payer: Self-pay | Admitting: Neurology

## 2016-07-15 ENCOUNTER — Other Ambulatory Visit: Payer: Self-pay | Admitting: Orthopedic Surgery

## 2016-07-15 ENCOUNTER — Ambulatory Visit: Payer: Medicare Other | Admitting: Neurology

## 2016-07-15 DIAGNOSIS — M25511 Pain in right shoulder: Secondary | ICD-10-CM

## 2016-07-17 ENCOUNTER — Ambulatory Visit
Admission: RE | Admit: 2016-07-17 | Discharge: 2016-07-17 | Disposition: A | Discharge status: Home / Self Care | Payer: Medicare Other | Source: Ambulatory Visit | Attending: Orthopedic Surgery | Admitting: Orthopedic Surgery

## 2016-07-17 DIAGNOSIS — M25511 Pain in right shoulder: Secondary | ICD-10-CM

## 2016-07-22 DIAGNOSIS — M19011 Primary osteoarthritis, right shoulder: Secondary | ICD-10-CM | POA: Diagnosis present

## 2016-07-22 NOTE — H&P (Signed)
PREOPERATIVE H&P Patient ID: ATAYA MURDY MRN: [id number] DOB/AGE: 07-20-1940 75 y.o.  Chief Complaint: OA RIGHT SHOULDER  Planned Procedure Date: 08/13/16 Medical Clearance by Dr. Osborne Casco   HPI: Ashley Cooke is a 76 y.o. female who presents for evaluation of OA RIGHT SHOULDER. The patient has a history of pain and functional disability in the right shoulder due to arthritis and has failed non-surgical conservative treatments for greater than 12 weeks to include NSAID's and/or analgesics, corticosteriod injections and activity modification.  Onset of symptoms was gradual, starting 1 years ago with gradually worsening course since that time.  Patient currently rates pain at 8 out of 10 with activity. Patient has night pain, worsening of pain with activity and weight bearing, pain that interferes with activities of daily living and pain with passive range of motion.  Patient has evidence of periarticular osteophytes and joint space narrowing by imaging studies. There is no active infection.  Past Medical History:  Diagnosis Date  . Arthritis    "primarily in my knee & thumbs" (10/15/2014)  . Hypercholesterolemia   . Hypertension   . Melanoma of lower leg ([place]) 05/2003   "left"  . Seasonal allergies   . Squamous carcinoma    "all on the left side:  under eye, nostril, bicept"   Past Surgical History:  Procedure Laterality Date  . DILATION AND CURETTAGE OF UTERUS  1968  . JOINT REPLACEMENT    . KNEE ARTHROSCOPY Left 05/06/2012   torn miniscus  . MELANOMA EXCISION Left 05/2003   leg  . MOHS SURGERY Left 2000's   "squamous; under eye and nostril"  . SQUAMOUS CELL CARCINOMA EXCISION Left 2008   upper bicept  . TONSILLECTOMY AND ADENOIDECTOMY  1949  . TOTAL KNEE ARTHROPLASTY Left 10/14/2014  . TOTAL KNEE ARTHROPLASTY Left 10/14/2014   Procedure: TOTAL KNEE ARTHROPLASTY;  Surgeon: Earlie Server, MD;  Location: [facility];  Service: Orthopedics;  Laterality: Left;  . TOTAL KNEE ARTHROPLASTY Right  02/10/2015   Procedure: RIGHT TOTAL KNEE ARTHROPLASTY;  Surgeon: Earlie Server, MD;  Location: [facility];  Service: Orthopedics;  Laterality: Right;   No Known Allergies Prior to Admission medications   Medication Sig Start Date End Date Taking? Authorizing Provider  aspirin EC 81 MG tablet Take 81 mg by mouth daily.    [provider]  atorvastatin (LIPITOR) 10 MG tablet Take 10 mg by mouth at bedtime.  08/31/13   [provider]  Calcium Carb-Cholecalciferol (CALCIUM 600+D) 600-800 MG-UNIT TABS Take 1 tablet by mouth daily.    [provider]  cholecalciferol (VITAMIN D) 1000 UNITS tablet Take 1,000 Units by mouth daily.     [provider]  famotidine (PEPCID) 20 MG tablet Take 20 mg by mouth daily.     [provider]  loratadine (CLARITIN) 10 MG tablet Take 10 mg by mouth daily as needed (seasonal allergies).     [provider]  losartan (COZAAR) 100 MG tablet Take 50 mg by mouth Daily. 02/18/11   [provider]  Multiple Vitamin (MULTIVITAMIN WITH MINERALS) TABS tablet Take 1 tablet by mouth daily.    [provider]  Naproxen Sodium (ALEVE) 220 MG CAPS Take 1 capsule by mouth 2 (two) times daily.    [provider]  nystatin cream (MYCOSTATIN) Apply to affected area BID for up to 7 days. Mix together with the Triamcinolone - pea size amount 12/29/15   Kem Boroughs, FNP  OVER THE COUNTER MEDICATION Place 1 drop into  both eyes 5 (five) times daily as needed (dry eyes). Over the counter lubricating eye drop    [provider]  triamcinolone (KENALOG) 0.025 % ointment Mix together with the Nystatin - pea size amount twice a day 12/29/15   Kem Boroughs, FNP  vitamin C (ASCORBIC ACID) 500 MG tablet Take 500 mg by mouth daily.     [provider]   Social History   Social History  . Marital status: Married    Spouse name: N/A  . Number of children: N/A  . Years of education: N/A   Social  History Main Topics  . Smoking status: Former Smoker    Packs/day: 0.50    Years: 22.00    Types: Cigarettes    Quit date: 06/22/1979  . Smokeless tobacco: Never Used  . Alcohol use 4.2 oz/week    7 Glasses of wine per week  . Drug use: No  . Sexual activity: Yes    Birth control/ protection: Post-menopausal   Other Topics Concern  . Not on file   Social History Narrative  . No narrative on file   Family History  Problem Relation Age of Onset  . Hypertension Mother   . Hypertension Sister 43  . Peripheral Artery Disease Sister   . Hypertension Maternal Grandfather   . Colon cancer Neg Hx     ROS: Currently denies lightheadedness, dizziness, Fever, chills, CP, SOB.   No personal history of DVT, PE, MI, or CVA. No loose teeth or dentures All other systems have been reviewed and were otherwise currently negative with the exception of those mentioned in the HPI and as above.  Objective: Vitals: Ht: 5'3" Wt: 155 Temp: 97.5 BP: 129/73 Pulse: 73 O2 99% on room air. Physical Exam: General: Alert, NAD.   HEENT: EOMI, Good Neck Extension  Pulm: No increased work of breathing.  Clear B/L A/P w/o crackle or wheeze.  CV: RRR, No m/g/r appreciated  GI: soft, NT, ND Neuro: Neuro without gross focal deficit.  Sensation intact distally Skin: No lesions in the area of chief complaint MSK/Surgical Site: Right shoulder w/o erythema or signs of infection.   Non tender to palpation.  Forward elevation to about 130 degrees,.  Negative empty can.  NVI  Imaging Review Plain radiographs demonstrate severe degenerative joint disease of the right shoulder.   Assessment: OA RIGHT SHOULDER Principal Problem:   Primary osteoarthritis, right shoulder Active Problems:   Pure hypercholesterolemia   Essential hypertension   Plan: Plan for Procedure(s): TOTAL SHOULDER ARTHROPLASTY  The patient history, physical exam, clinical judgement of the provider and imaging are consistent with end stage  degenerative joint disease and reverse total vs total shoulder arthroplasty is deemed medically necessary. The treatment options including medical management, injection therapy, and arthroplasty were discussed at length. The risks and benefits of Procedure(s): REVERSE TOTAL VS  TOTAL SHOULDER ARTHROPLASTY were presented and reviewed.  The risks of nonoperative treatment, versus surgical intervention including but not limited to continued pain, aseptic loosening, stiffness, dislocation/subluxation, infection, bleeding, nerve injury, blood clots, cardiopulmonary complications, morbidity, mortality, among others were discussed. The patient verbalizes understanding and wishes to proceed with the plan.  Patient is being admitted for inpatient treatment for surgery, pain control, PT, OT, prophylactic antibiotics, VTE prophylaxis, progressive ambulation, ADL's and discharge planning.   Dental prophylaxis discussed and recommended for 2 years postoperatively.   The patient does meet the criteria for TXA which will be used perioperatively via IV.    ASA 81 mg  will continued postoperatively.  DVT prophylaxis: SCDs, and early ambulation.  The patient is planning to be discharged home in care of her husband.  Prudencio Burly III, PA-C 07/22/2016 6:47 PM

## 2016-08-01 NOTE — Pre-Procedure Instructions (Signed)
KADEISHA BETSCH  08/01/2016      [id number] - [place], [place] - [address] [place] [address] Phone: [phone] Fax: [phone]  [id number] - [place], [place] [address] [place] [address] Phone: [phone] Fax: [phone]    Your procedure is scheduled on August 13, 2016.  Report to Operating Room Services Admitting at 630 AM.  Call this number if you have problems the morning of surgery:  [phone]   Remember:  Do not eat food or drink liquids after midnight.  Take these medicines the morning of surgery with A SIP OF WATER femotidine (pepcid), loratadine (claritin), ketotifen (zaditor) eye drops, artificial tears eye drops.  7 days prior to surgery STOP taking any Aspirin, Aleve, Naproxen, Ibuprofen, Motrin, Advil, Goody's, BC's, all herbal medications, fish oil, and all vitamins   Do not wear jewelry, make-up or nail polish.  Do not wear lotions, powders, or perfumes, or deoderant.  Do not shave 48 hours prior to surgery.  Men may shave face and neck.  Do not bring valuables to the hospital.  [facility] is not responsible for any belongings or valuables.  Contacts, dentures or bridgework may not be worn into surgery.  Leave your suitcase in the car.  After surgery it may be brought to your room.  For patients admitted to the hospital, discharge time will be determined by your treatment team.  Patients discharged the day of surgery will not be allowed to drive home.    Special instructions:   [place]- Preparing For Surgery  Before surgery, you can play an important role. Because skin is not sterile, your skin needs to be as free of germs as possible. You can reduce the number of germs on your skin by washing with CHG (chlorahexidine gluconate) Soap before surgery.  CHG is an antiseptic cleaner which kills germs and bonds with the  skin to continue killing germs even after washing.  Please do not use if you have an allergy to CHG or antibacterial soaps. If your skin becomes reddened/irritated stop using the CHG.  Do not shave (including legs and underarms) for at least 48 hours prior to first CHG shower. It is OK to shave your face.  Please follow these instructions carefully.   1. Shower the NIGHT BEFORE SURGERY and the MORNING OF SURGERY with CHG.   2. If you chose to wash your hair, wash your hair first as usual with your normal shampoo.  3. After you shampoo, rinse your hair and body thoroughly to remove the shampoo.  4. Use CHG as you would any other liquid soap. You can apply CHG directly to the skin and wash gently with a scrungie or a clean washcloth.   5. Apply the CHG Soap to your body ONLY FROM THE NECK DOWN.  Do not use on open wounds or open sores. Avoid contact with your eyes, ears, mouth and genitals (private parts). Wash genitals (private parts) with your normal soap.  6. Wash thoroughly, paying special attention to the area where your surgery will be performed.  7. Thoroughly rinse your body with warm water from the neck down.  8. DO NOT shower/wash with your normal soap after using and rinsing off the CHG Soap.  9. Pat yourself dry with a CLEAN TOWEL.   10. Wear CLEAN PAJAMAS   11. Place CLEAN SHEETS on  your bed the night of your first shower and DO NOT SLEEP WITH PETS.    Day of Surgery: Do not apply any deodorants/lotions. Please wear clean clothes to the hospital/surgery center.     Please read over the following fact sheets that you were given. Pain Booklet, Coughing and Deep Breathing, MRSA Information and Surgical Site Infection Prevention

## 2016-08-02 ENCOUNTER — Encounter (HOSPITAL_COMMUNITY)
Admission: RE | Admit: 2016-08-02 | Discharge: 2016-08-02 | Disposition: A | Discharge status: Home / Self Care | Payer: Medicare Other | Source: Ambulatory Visit | Attending: Orthopedic Surgery | Admitting: Orthopedic Surgery

## 2016-08-02 ENCOUNTER — Other Ambulatory Visit (HOSPITAL_COMMUNITY): Payer: Medicare Other

## 2016-08-02 ENCOUNTER — Encounter (HOSPITAL_COMMUNITY): Payer: Self-pay

## 2016-08-02 DIAGNOSIS — Z01812 Encounter for preprocedural laboratory examination: Secondary | ICD-10-CM | POA: Insufficient documentation

## 2016-08-02 DIAGNOSIS — Z01810 Encounter for preprocedural cardiovascular examination: Secondary | ICD-10-CM | POA: Diagnosis not present

## 2016-08-02 DIAGNOSIS — M19011 Primary osteoarthritis, right shoulder: Secondary | ICD-10-CM | POA: Diagnosis not present

## 2016-08-02 LAB — BASIC METABOLIC PANEL
Anion gap: 9 (ref 5–15)
BUN: 25 mg/dL — AB (ref 6–20)
CALCIUM: 9.4 mg/dL (ref 8.9–10.3)
CO2: 24 mmol/L (ref 22–32)
CREATININE: 0.99 mg/dL (ref 0.44–1.00)
Chloride: 104 mmol/L (ref 101–111)
GFR calc Af Amer: >60 mL/min (ref >60)
GFR, EST NON AFRICAN AMERICAN: 54 mL/min — AB (ref >60)
GLUCOSE: 70 mg/dL (ref 65–99)
Potassium: 4 mmol/L (ref 3.5–5.1)
Sodium: 137 mmol/L (ref 135–145)

## 2016-08-02 LAB — CBC
HCT: 41.5 % (ref 36.0–46.0)
HEMOGLOBIN: 14 g/dL (ref 12.0–15.0)
MCH: 32.4 pg (ref 26.0–34.0)
MCHC: 33.7 g/dL (ref 30.0–36.0)
MCV: 96.1 fL (ref 78.0–100.0)
PLATELETS: 305 10*3/uL (ref 150–400)
RBC: 4.32 MIL/uL (ref 3.87–5.11)
RDW: 13.8 % (ref 11.5–15.5)
WBC: 6.9 10*3/uL (ref 4.0–10.5)

## 2016-08-02 LAB — SURGICAL PCR SCREEN
MRSA, PCR: NEGATIVE
STAPHYLOCOCCUS AUREUS: NEGATIVE

## 2016-08-02 NOTE — Pre-Procedure Instructions (Addendum)
Ashley Cooke  08/02/2016      [id number] - [place], [place] - [address] AT [address]. [place] [address] Phone: [phone] Fax: [phone]  [id number] - [place] - [place] [address] [place] [address] Phone: [phone] Fax: [phone]    Your procedure is scheduled on August 13, 2016.  Report to [facility] Admitting at 630 AM.  Call this number if you have problems the morning of surgery:  [phone]   Remember:  Do not eat food or drink liquids after midnight.  Take these medicines the morning of surgery with A SIP OF WATER famotidine (pepcid), loratadine (claritin), artificial tears eye drops.  7 days prior to surgery 08/06/16 STOP taking any Aspirin, Aleve, Naproxen, Ibuprofen, Motrin, Advil, Goody's, BC's, all herbal medications, fish oil, and all vitamins   Do not wear jewelry, make-up or nail polish.  Do not wear lotions, powders, or perfumes, or deoderant.  Do not shave 48 hours prior to surgery.  Men may shave face and neck.  Do not bring valuables to the hospital.  [facility] is not responsible for any belongings or valuables.  Contacts, dentures or bridgework may not be worn into surgery.  Leave your suitcase in the car.  After surgery it may be brought to your room.  For patients admitted to the hospital, discharge time will be determined by your treatment team.  Patients discharged the day of surgery will not be allowed to drive home.    Special instructions:   [place]- Preparing For Surgery  Before surgery, you can play an important role. Because skin is not sterile, your skin needs to be as free of germs as possible. You can reduce the number of germs on your skin by washing with CHG (chlorahexidine gluconate) Soap before surgery.  CHG is an antiseptic cleaner which kills germs and bonds with the skin to continue killing  germs even after washing.  Please do not use if you have an allergy to CHG or antibacterial soaps. If your skin becomes reddened/irritated stop using the CHG.  Do not shave (including legs and underarms) for at least 48 hours prior to first CHG shower. It is OK to shave your face.  Please follow these instructions carefully.   1. Shower the NIGHT BEFORE SURGERY and the MORNING OF SURGERY with CHG.   2. If you chose to wash your hair, wash your hair first as usual with your normal shampoo.  3. After you shampoo, rinse your hair and body thoroughly to remove the shampoo.  4. Use CHG as you would any other liquid soap. You can apply CHG directly to the skin and wash gently with a scrungie or a clean washcloth.   5. Apply the CHG Soap to your body ONLY FROM THE NECK DOWN.  Do not use on open wounds or open sores. Avoid contact with your eyes, ears, mouth and genitals (private parts). Wash genitals (private parts) with your normal soap.  6. Wash thoroughly, paying special attention to the area where your surgery will be performed.  7. Thoroughly rinse your body with warm water from the neck down.  8. DO NOT shower/wash with your normal soap after using and rinsing off the CHG Soap.  9. Pat yourself dry with a CLEAN TOWEL.   10. Wear CLEAN PAJAMAS   11. Place CLEAN SHEETS on your bed the  night of your first shower and DO NOT SLEEP WITH PETS.    Day of Surgery: Do not apply any deodorants/lotions. Please wear clean clothes to the hospital/surgery center.     Please read over the following fact sheets that you were given. Pain Booklet, Coughing and Deep Breathing, MRSA Information and Surgical Site Infection Prevention

## 2016-08-05 NOTE — Progress Notes (Signed)
Anesthesia Chart Review:  Pt is a 76 year old female scheduled for R total shoulder arthroplasty on 08/13/2016 with Edmonia Lynch, MD  PMH includes:  HTN, hyperlipidemia, melanoma, GERD. Former smoker. BMI 28. S/p  R TKA 02/10/15. S/p L TKA 10/14/14.    Medications include: ASA 81mg , lipitor, pepcid, losartan  Preoperative labs reviewed.    EKG 08/02/16: Sinus rhythm with PACs. Cannot rule out Septal infarct, age undetermined. Appears stable when compared to EKG 10/03/14.   If no changes, I anticipate pt can proceed with surgery as scheduled.   Willeen Cass, FNP-BC [facility]/Anesthesiology Phone: [phone] 08/05/2016 3:43 PM

## 2016-08-07 ENCOUNTER — Telehealth: Payer: Self-pay | Admitting: Obstetrics and Gynecology

## 2016-08-07 NOTE — Telephone Encounter (Signed)
Left message on voicemail to call and reschedule cancelled appointment. Mail letter °

## 2016-08-12 MED ORDER — GABAPENTIN 300 MG PO CAPS
300.0000 mg | ORAL_CAPSULE | Freq: Once | ORAL | Status: AC
  Administered 2016-08-13: 300 mg via ORAL
  Filled 2016-08-12: qty 1

## 2016-08-12 MED ORDER — ACETAMINOPHEN 500 MG PO TABS
1000.0000 mg | ORAL_TABLET | Freq: Once | ORAL | Status: AC
  Administered 2016-08-13: 1000 mg via ORAL
  Filled 2016-08-12: qty 2

## 2016-08-12 MED ORDER — CEFAZOLIN SODIUM-DEXTROSE 2-4 GM/100ML-% IV SOLN
2.0000 g | INTRAVENOUS | Status: AC
  Administered 2016-08-13: 2 g via INTRAVENOUS
  Filled 2016-08-12: qty 100

## 2016-08-12 MED ORDER — TRANEXAMIC ACID 1000 MG/10ML IV SOLN
1000.0000 mg | INTRAVENOUS | Status: AC
  Administered 2016-08-13: 1000 mg via INTRAVENOUS
  Filled 2016-08-12: qty 10

## 2016-08-13 ENCOUNTER — Inpatient Hospital Stay (HOSPITAL_COMMUNITY): Payer: Medicare Other | Admitting: Emergency Medicine

## 2016-08-13 ENCOUNTER — Inpatient Hospital Stay (HOSPITAL_COMMUNITY): Payer: Medicare Other

## 2016-08-13 ENCOUNTER — Encounter (HOSPITAL_COMMUNITY): Payer: Self-pay | Admitting: *Deleted

## 2016-08-13 ENCOUNTER — Inpatient Hospital Stay (HOSPITAL_COMMUNITY): Payer: Medicare Other | Admitting: Certified Registered Nurse Anesthetist

## 2016-08-13 ENCOUNTER — Encounter (HOSPITAL_COMMUNITY)
Admission: RE | Disposition: A | Discharge status: Home / Self Care | Payer: Self-pay | Source: Ambulatory Visit | Attending: Orthopedic Surgery

## 2016-08-13 ENCOUNTER — Inpatient Hospital Stay (HOSPITAL_COMMUNITY)
Admission: RE | Admit: 2016-08-13 | Discharge: 2016-08-14 | DRG: 483 | Disposition: A | Discharge status: Home / Self Care | Payer: Medicare Other | Source: Ambulatory Visit | Attending: Orthopedic Surgery | Admitting: Orthopedic Surgery

## 2016-08-13 DIAGNOSIS — Z85820 Personal history of malignant melanoma of skin: Secondary | ICD-10-CM | POA: Diagnosis not present

## 2016-08-13 DIAGNOSIS — Z79899 Other long term (current) drug therapy: Secondary | ICD-10-CM

## 2016-08-13 DIAGNOSIS — I10 Essential (primary) hypertension: Secondary | ICD-10-CM | POA: Diagnosis present

## 2016-08-13 DIAGNOSIS — M19011 Primary osteoarthritis, right shoulder: Secondary | ICD-10-CM | POA: Diagnosis present

## 2016-08-13 DIAGNOSIS — Z96652 Presence of left artificial knee joint: Secondary | ICD-10-CM | POA: Diagnosis present

## 2016-08-13 DIAGNOSIS — E7800 Pure hypercholesterolemia, unspecified: Secondary | ICD-10-CM | POA: Diagnosis present

## 2016-08-13 DIAGNOSIS — Z96611 Presence of right artificial shoulder joint: Secondary | ICD-10-CM

## 2016-08-13 DIAGNOSIS — Z7982 Long term (current) use of aspirin: Secondary | ICD-10-CM

## 2016-08-13 DIAGNOSIS — K219 Gastro-esophageal reflux disease without esophagitis: Secondary | ICD-10-CM | POA: Diagnosis present

## 2016-08-13 DIAGNOSIS — Z8249 Family history of ischemic heart disease and other diseases of the circulatory system: Secondary | ICD-10-CM | POA: Diagnosis not present

## 2016-08-13 DIAGNOSIS — Z87891 Personal history of nicotine dependence: Secondary | ICD-10-CM | POA: Diagnosis not present

## 2016-08-13 HISTORY — PX: TOTAL SHOULDER ARTHROPLASTY: SHX126

## 2016-08-13 SURGERY — ARTHROPLASTY, SHOULDER, TOTAL
Anesthesia: General | Site: Shoulder | Laterality: Right

## 2016-08-13 MED ORDER — ONDANSETRON HCL 4 MG PO TABS: 4.0000 mg | ORAL_TABLET | Freq: Three times a day (TID) | ORAL | 0 refills | Status: DC | PRN

## 2016-08-13 MED ORDER — DEXAMETHASONE SODIUM PHOSPHATE 10 MG/ML IJ SOLN
INTRAMUSCULAR | Status: DC | PRN
  Administered 2016-08-13: 5 mg via INTRAVENOUS

## 2016-08-13 MED ORDER — LIDOCAINE HCL (CARDIAC) 20 MG/ML IV SOLN
INTRAVENOUS | Status: DC | PRN
  Administered 2016-08-13: 80 mg via INTRAVENOUS

## 2016-08-13 MED ORDER — SODIUM CHLORIDE 0.9 % IR SOLN
Status: DC | PRN
  Administered 2016-08-13: 1000 mL

## 2016-08-13 MED ORDER — FENTANYL CITRATE (PF) 250 MCG/5ML IJ SOLN
INTRAMUSCULAR | Status: AC
  Filled 2016-08-13: qty 5

## 2016-08-13 MED ORDER — ACETAMINOPHEN 650 MG RE SUPP: 650.0000 mg | Freq: Four times a day (QID) | RECTAL | Status: DC | PRN

## 2016-08-13 MED ORDER — POLYVINYL ALCOHOL 1.4 % OP SOLN
1.0000 [drp] | Freq: Four times a day (QID) | OPHTHALMIC | Status: DC | PRN
  Filled 2016-08-13: qty 15

## 2016-08-13 MED ORDER — PROPOFOL 10 MG/ML IV BOLUS
INTRAVENOUS | Status: DC | PRN
  Administered 2016-08-13: 20 mg via INTRAVENOUS
  Administered 2016-08-13: 100 mg via INTRAVENOUS

## 2016-08-13 MED ORDER — ONDANSETRON HCL 4 MG PO TABS: 4.0000 mg | ORAL_TABLET | Freq: Four times a day (QID) | ORAL | Status: DC | PRN

## 2016-08-13 MED ORDER — SENNA 8.6 MG PO TABS
1.0000 | ORAL_TABLET | Freq: Two times a day (BID) | ORAL | Status: DC
  Administered 2016-08-13 – 2016-08-14 (×2): 8.6 mg via ORAL
  Filled 2016-08-13 (×2): qty 1

## 2016-08-13 MED ORDER — POLYETHYLENE GLYCOL 3350 17 G PO PACK
17.0000 g | PACK | Freq: Every day | ORAL | Status: DC | PRN
Start: 2016-08-13 — End: 2016-08-14

## 2016-08-13 MED ORDER — SUCCINYLCHOLINE CHLORIDE 20 MG/ML IJ SOLN
INTRAMUSCULAR | Status: DC | PRN
  Administered 2016-08-13: 120 mg via INTRAVENOUS

## 2016-08-13 MED ORDER — DIPHENHYDRAMINE HCL 12.5 MG/5ML PO ELIX: 12.5000 mg | ORAL_SOLUTION | ORAL | Status: DC | PRN

## 2016-08-13 MED ORDER — CELECOXIB 200 MG PO CAPS
200.0000 mg | ORAL_CAPSULE | Freq: Two times a day (BID) | ORAL | Status: DC
  Administered 2016-08-13 – 2016-08-14 (×2): 200 mg via ORAL
  Filled 2016-08-13 (×2): qty 1

## 2016-08-13 MED ORDER — ONDANSETRON HCL 4 MG/2ML IJ SOLN: 4.0000 mg | Freq: Four times a day (QID) | INTRAMUSCULAR | Status: DC | PRN

## 2016-08-13 MED ORDER — LACTATED RINGERS IV SOLN: INTRAVENOUS | Status: DC

## 2016-08-13 MED ORDER — ACETAMINOPHEN 325 MG PO TABS: 650.0000 mg | ORAL_TABLET | Freq: Four times a day (QID) | ORAL | Status: DC | PRN

## 2016-08-13 MED ORDER — ROCURONIUM BROMIDE 100 MG/10ML IV SOLN
INTRAVENOUS | Status: DC | PRN
  Administered 2016-08-13: 30 mg via INTRAVENOUS
  Administered 2016-08-13: 20 mg via INTRAVENOUS
  Administered 2016-08-13: 10 mg via INTRAVENOUS

## 2016-08-13 MED ORDER — PROPOFOL 10 MG/ML IV BOLUS
INTRAVENOUS | Status: AC
  Filled 2016-08-13: qty 20

## 2016-08-13 MED ORDER — POLYVINYL ALCOHOL 1.4 % OP SOLN: 1.0000 [drp] | Freq: Four times a day (QID) | OPHTHALMIC | Status: DC | PRN

## 2016-08-13 MED ORDER — LACTATED RINGERS IV SOLN
INTRAVENOUS | Status: DC | PRN
  Administered 2016-08-13 (×3): via INTRAVENOUS

## 2016-08-13 MED ORDER — METHOCARBAMOL 1000 MG/10ML IJ SOLN
500.0000 mg | Freq: Four times a day (QID) | INTRAVENOUS | Status: DC | PRN
  Filled 2016-08-13: qty 5

## 2016-08-13 MED ORDER — FENTANYL CITRATE (PF) 100 MCG/2ML IJ SOLN
INTRAMUSCULAR | Status: DC | PRN
  Administered 2016-08-13: 25 ug via INTRAVENOUS
  Administered 2016-08-13: 50 ug via INTRAVENOUS

## 2016-08-13 MED ORDER — METOCLOPRAMIDE HCL 5 MG/ML IJ SOLN: 5.0000 mg | Freq: Three times a day (TID) | INTRAMUSCULAR | Status: DC | PRN

## 2016-08-13 MED ORDER — EPHEDRINE SULFATE 50 MG/ML IJ SOLN
INTRAMUSCULAR | Status: DC | PRN
  Administered 2016-08-13 (×4): 5 mg via INTRAVENOUS

## 2016-08-13 MED ORDER — OXYCODONE-ACETAMINOPHEN 5-325 MG PO TABS: 1.0000 | ORAL_TABLET | ORAL | 0 refills | Status: DC | PRN

## 2016-08-13 MED ORDER — LACTATED RINGERS IV SOLN
INTRAVENOUS | Status: DC
  Administered 2016-08-13: 07:00:00 via INTRAVENOUS

## 2016-08-13 MED ORDER — BACLOFEN 10 MG PO TABS: 10.0000 mg | ORAL_TABLET | Freq: Three times a day (TID) | ORAL | 0 refills | Status: DC | PRN

## 2016-08-13 MED ORDER — OXYCODONE HCL 5 MG PO TABS
5.0000 mg | ORAL_TABLET | ORAL | Status: DC | PRN
  Administered 2016-08-13 – 2016-08-14 (×2): 10 mg via ORAL
  Filled 2016-08-13 (×3): qty 2

## 2016-08-13 MED ORDER — SORBITOL 70 % SOLN: 30.0000 mL | Freq: Every day | Status: DC | PRN

## 2016-08-13 MED ORDER — ATORVASTATIN CALCIUM 10 MG PO TABS
10.0000 mg | ORAL_TABLET | Freq: Every evening | ORAL | Status: DC
  Administered 2016-08-13: 10 mg via ORAL
  Filled 2016-08-13: qty 1

## 2016-08-13 MED ORDER — DOCUSATE SODIUM 100 MG PO CAPS: 100.0000 mg | ORAL_CAPSULE | Freq: Two times a day (BID) | ORAL | 0 refills | Status: DC

## 2016-08-13 MED ORDER — ONDANSETRON HCL 4 MG/2ML IJ SOLN
INTRAMUSCULAR | Status: DC | PRN
  Administered 2016-08-13: 4 mg via INTRAVENOUS

## 2016-08-13 MED ORDER — HYDROMORPHONE HCL 1 MG/ML IJ SOLN: 0.2500 mg | INTRAMUSCULAR | Status: DC | PRN

## 2016-08-13 MED ORDER — ENOXAPARIN SODIUM 40 MG/0.4ML ~~LOC~~ SOLN
40.0000 mg | SUBCUTANEOUS | Status: DC
  Administered 2016-08-14: 40 mg via SUBCUTANEOUS
  Filled 2016-08-13: qty 0.4

## 2016-08-13 MED ORDER — METHOCARBAMOL 500 MG PO TABS
500.0000 mg | ORAL_TABLET | Freq: Four times a day (QID) | ORAL | Status: DC | PRN
  Administered 2016-08-13 – 2016-08-14 (×2): 500 mg via ORAL
  Filled 2016-08-13 (×2): qty 1

## 2016-08-13 MED ORDER — MIDAZOLAM HCL 2 MG/2ML IJ SOLN
INTRAMUSCULAR | Status: AC
  Filled 2016-08-13: qty 2

## 2016-08-13 MED ORDER — BUPIVACAINE-EPINEPHRINE (PF) 0.5% -1:200000 IJ SOLN
INTRAMUSCULAR | Status: DC | PRN
  Administered 2016-08-13: 20 mL via PERINEURAL

## 2016-08-13 MED ORDER — METOCLOPRAMIDE HCL 5 MG PO TABS: 5.0000 mg | ORAL_TABLET | Freq: Three times a day (TID) | ORAL | Status: DC | PRN

## 2016-08-13 MED ORDER — KETOROLAC TROMETHAMINE 15 MG/ML IJ SOLN: 7.5000 mg | Freq: Three times a day (TID) | INTRAMUSCULAR | Status: DC | PRN

## 2016-08-13 MED ORDER — MIDAZOLAM HCL 2 MG/2ML IJ SOLN
1.0000 mg | Freq: Once | INTRAMUSCULAR | Status: AC
  Administered 2016-08-13: 1 mg via INTRAVENOUS

## 2016-08-13 MED ORDER — FAMOTIDINE 20 MG PO TABS
20.0000 mg | ORAL_TABLET | Freq: Every day | ORAL | Status: DC
  Administered 2016-08-13 – 2016-08-14 (×2): 20 mg via ORAL
  Filled 2016-08-13 (×2): qty 1

## 2016-08-13 MED ORDER — CHLORHEXIDINE GLUCONATE 4 % EX LIQD: 60.0000 mL | Freq: Once | CUTANEOUS | Status: DC

## 2016-08-13 MED ORDER — DOCUSATE SODIUM 100 MG PO CAPS
100.0000 mg | ORAL_CAPSULE | Freq: Two times a day (BID) | ORAL | Status: DC
  Administered 2016-08-13 – 2016-08-14 (×3): 100 mg via ORAL
  Filled 2016-08-13 (×3): qty 1

## 2016-08-13 MED ORDER — PROMETHAZINE HCL 25 MG/ML IJ SOLN: 6.2500 mg | INTRAMUSCULAR | Status: DC | PRN

## 2016-08-13 MED ORDER — HYDROMORPHONE HCL 1 MG/ML IJ SOLN
0.5000 mg | INTRAMUSCULAR | Status: DC | PRN
  Administered 2016-08-14: 1 mg via INTRAVENOUS
  Filled 2016-08-13: qty 1

## 2016-08-13 MED ORDER — MAGNESIUM CITRATE PO SOLN: 1.0000 | Freq: Once | ORAL | Status: DC | PRN

## 2016-08-13 MED ORDER — ASPIRIN EC 81 MG PO TBEC
81.0000 mg | DELAYED_RELEASE_TABLET | Freq: Every day | ORAL | Status: DC
  Administered 2016-08-13 – 2016-08-14 (×2): 81 mg via ORAL
  Filled 2016-08-13 (×2): qty 1

## 2016-08-13 MED ORDER — SUGAMMADEX SODIUM 200 MG/2ML IV SOLN
INTRAVENOUS | Status: DC | PRN
  Administered 2016-08-13: 150 mg via INTRAVENOUS

## 2016-08-13 MED ORDER — FENTANYL CITRATE (PF) 100 MCG/2ML IJ SOLN
INTRAMUSCULAR | Status: AC
  Filled 2016-08-13: qty 2

## 2016-08-13 MED ORDER — FENTANYL CITRATE (PF) 100 MCG/2ML IJ SOLN
50.0000 ug | Freq: Once | INTRAMUSCULAR | Status: AC
  Administered 2016-08-13: 50 ug via INTRAVENOUS

## 2016-08-13 MED ORDER — CEFAZOLIN SODIUM-DEXTROSE 2-4 GM/100ML-% IV SOLN
2.0000 g | Freq: Four times a day (QID) | INTRAVENOUS | Status: AC
  Administered 2016-08-13 – 2016-08-14 (×3): 2 g via INTRAVENOUS
  Filled 2016-08-13 (×3): qty 100

## 2016-08-13 MED ORDER — SODIUM CHLORIDE 0.9 % IV SOLN
INTRAVENOUS | Status: DC | PRN
  Administered 2016-08-13: 10 ug/min via INTRAVENOUS

## 2016-08-13 MED ORDER — ACETAMINOPHEN 500 MG PO TABS
1000.0000 mg | ORAL_TABLET | Freq: Three times a day (TID) | ORAL | Status: DC
  Administered 2016-08-13 – 2016-08-14 (×3): 1000 mg via ORAL
  Filled 2016-08-13 (×3): qty 2

## 2016-08-13 MED ORDER — LOSARTAN POTASSIUM 50 MG PO TABS
50.0000 mg | ORAL_TABLET | Freq: Every day | ORAL | Status: DC
  Administered 2016-08-13 – 2016-08-14 (×2): 50 mg via ORAL
  Filled 2016-08-13 (×2): qty 1

## 2016-08-13 SURGICAL SUPPLY — 60 items
AID PSTN UNV HD RSTRNT DISP (MISCELLANEOUS) ×1
BLADE SAW SAG 29X58X.64 (BLADE) IMPLANT
BLADE SAW SAG 73X25 THK (BLADE) ×2
BLADE SAW SGTL 73X25 THK (BLADE) ×1 IMPLANT
BONE CEMENT PALACOSE (Cement) ×3 IMPLANT
CAPT SHLDR TOTAL 2 ×2 IMPLANT
CEMENT BONE PALACOSE (Cement) IMPLANT
CHLORAPREP W/TINT 26ML (MISCELLANEOUS) ×3 IMPLANT
CLOSURE STERI-STRIP 1/2X4 (GAUZE/BANDAGES/DRESSINGS) ×1
CLOSURE WOUND 1/2 X4 (GAUZE/BANDAGES/DRESSINGS)
CLSR STERI-STRIP ANTIMIC 1/2X4 (GAUZE/BANDAGES/DRESSINGS) ×1 IMPLANT
COVER SURGICAL LIGHT HANDLE (MISCELLANEOUS) ×3 IMPLANT
DRAPE INCISE IOBAN 66X45 STRL (DRAPES) ×3 IMPLANT
DRAPE U-SHAPE 47X51 STRL (DRAPES) ×3 IMPLANT
DRSG ADAPTIC 3X8 NADH LF (GAUZE/BANDAGES/DRESSINGS) ×1 IMPLANT
DRSG AQUACEL AG ADV 3.5X10 (GAUZE/BANDAGES/DRESSINGS) ×2 IMPLANT
DRSG PAD ABDOMINAL 8X10 ST (GAUZE/BANDAGES/DRESSINGS) ×2 IMPLANT
ELECT REM PT RETURN 9FT ADLT (ELECTROSURGICAL) ×3
ELECTRODE REM PT RTRN 9FT ADLT (ELECTROSURGICAL) ×1 IMPLANT
GAUZE SPONGE 4X4 12PLY STRL (GAUZE/BANDAGES/DRESSINGS) ×1 IMPLANT
GLOVE BIO SURGEON STRL SZ7.5 (GLOVE) ×6 IMPLANT
GLOVE BIOGEL PI IND STRL 8 (GLOVE) ×2 IMPLANT
GLOVE BIOGEL PI INDICATOR 8 (GLOVE) ×4
GOWN STRL REUS W/ TWL LRG LVL3 (GOWN DISPOSABLE) ×2 IMPLANT
GOWN STRL REUS W/ TWL XL LVL3 (GOWN DISPOSABLE) ×1 IMPLANT
GOWN STRL REUS W/TWL LRG LVL3 (GOWN DISPOSABLE) ×9
GOWN STRL REUS W/TWL XL LVL3 (GOWN DISPOSABLE) ×6
KIT BASIN OR (CUSTOM PROCEDURE TRAY) ×3 IMPLANT
KIT ROOM TURNOVER OR (KITS) ×3 IMPLANT
MANIFOLD NEPTUNE II (INSTRUMENTS) ×3 IMPLANT
NDL HYPO 25GX1X1/2 BEV (NEEDLE) ×1 IMPLANT
NDL SUT .5 MAYO 1.404X.05X (NEEDLE) ×1 IMPLANT
NEEDLE HYPO 25GX1X1/2 BEV (NEEDLE) ×3 IMPLANT
NEEDLE MAYO TAPER (NEEDLE) ×3
NS IRRIG 1000ML POUR BTL (IV SOLUTION) ×3 IMPLANT
PACK SHOULDER (CUSTOM PROCEDURE TRAY) ×3 IMPLANT
PAD ARMBOARD 7.5X6 YLW CONV (MISCELLANEOUS) ×6 IMPLANT
PIN HUMERAL STMN 3.2MMX9IN (INSTRUMENTS) IMPLANT
PIN STEINMANN THREADED TIP (PIN) IMPLANT
RESTRAINT HEAD UNIVERSAL NS (MISCELLANEOUS) ×3 IMPLANT
SLING ARM IMMOBILIZER LRG (SOFTGOODS) ×1 IMPLANT
SLING ARM IMMOBILIZER MED (SOFTGOODS) ×2 IMPLANT
SPONGE LAP 18X18 X RAY DECT (DISPOSABLE) ×3 IMPLANT
STRIP CLOSURE SKIN 1/2X4 (GAUZE/BANDAGES/DRESSINGS) ×1 IMPLANT
SUCTION FRAZIER HANDLE 10FR (MISCELLANEOUS) ×2
SUCTION TUBE FRAZIER 10FR DISP (MISCELLANEOUS) ×1 IMPLANT
SUPPORT WRAP ARM LG (MISCELLANEOUS) ×2 IMPLANT
SUT FIBERWIRE #2 38 REV NDL BL (SUTURE) ×6
SUT FIBERWIRE #2 38 T-5 BLUE (SUTURE) ×12
SUT MNCRL AB 4-0 PS2 18 (SUTURE) ×3 IMPLANT
SUT MON AB 2-0 CT1 36 (SUTURE) ×3 IMPLANT
SUT VIC AB 0 CT1 27 (SUTURE)
SUT VIC AB 0 CT1 27XBRD ANBCTR (SUTURE) ×1 IMPLANT
SUTURE FIBERWR #2 38 T-5 BLUE (SUTURE) ×4 IMPLANT
SUTURE FIBERWR#2 38 REV NDL BL (SUTURE) IMPLANT
TOWEL OR 17X24 6PK STRL BLUE (TOWEL DISPOSABLE) ×3 IMPLANT
TOWEL OR 17X26 10 PK STRL BLUE (TOWEL DISPOSABLE) ×3 IMPLANT
TOWER CARTRIDGE SMART MIX (DISPOSABLE) ×2 IMPLANT
TRAY FOLEY CATH SILVER 14FR (SET/KITS/TRAYS/PACK) IMPLANT
WATER STERILE IRR 1000ML POUR (IV SOLUTION) ×1 IMPLANT

## 2016-08-13 NOTE — Anesthesia Preprocedure Evaluation (Signed)
Anesthesia Evaluation  Patient identified by MRN, date of birth, ID band Patient awake    Reviewed: Allergy & Precautions, NPO status , Patient's Chart, lab work & pertinent test results  Airway Mallampati: II   Neck ROM: Full    Dental no notable dental hx.    Pulmonary former smoker,    breath sounds clear to auscultation       Cardiovascular hypertension,  Rhythm:Regular Rate:Normal     Neuro/Psych    GI/Hepatic GERD  ,  Endo/Other    Renal/GU      Musculoskeletal  (+) Arthritis ,   Abdominal   Peds  Hematology   Anesthesia Other Findings   Reproductive/Obstetrics                             Anesthesia Physical Anesthesia Plan  ASA: II  Anesthesia Plan: General   Post-op Pain Management:  Regional for Post-op pain   Induction: Intravenous  PONV Risk Score and Plan: 3 and Ondansetron, Dexamethasone, Propofol and Midazolam  Airway Management Planned: Oral ETT  Additional Equipment:   Intra-op Plan:   Post-operative Plan: Extubation in OR  Informed Consent: I have reviewed the patients History and Physical, chart, labs and discussed the procedure including the risks, benefits and alternatives for the proposed anesthesia with the patient or authorized representative who has indicated his/her understanding and acceptance.   Dental advisory given  Plan Discussed with: CRNA  Anesthesia Plan Comments:         Anesthesia Quick Evaluation

## 2016-08-13 NOTE — OR Nursing (Signed)
Zella Richer, RN charted under L. Grandville Silos, RN log in from [phone] till [phone] by mistake.

## 2016-08-13 NOTE — Discharge Instructions (Signed)
INSTRUCTIONS AFTER JOINT REPLACEMENT   o Remove items at home which could result in a fall. This includes throw rugs or furniture in walking pathways o ICE to the affected joint every three hours while awake for 30 minutes at a time, for at least the first 3-5 days, and then as needed for pain and swelling.  Continue to use ice for pain and swelling. You may notice swelling that will progress down to the foot and ankle.  This is normal after surgery.  Elevate your leg when you are not up walking on it.   o Continue to use the breathing machine you got in the hospital (incentive spirometer) which will help keep your temperature down.  It is common for your temperature to cycle up and down following surgery, especially at night when you are not up moving around and exerting yourself.  The breathing machine keeps your lungs expanded and your temperature down.  DIET:  As you were doing prior to hospitalization, we recommend a well-balanced diet.  DRESSING / WOUND CARE / SHOWERING  Keep the surgical dressing until follow up.  The dressing is water proof, so you can shower without any extra covering.  IF THE DRESSING FALLS OFF or the wound gets wet inside, change the dressing with sterile gauze.  Please use good hand washing techniques before changing the dressing.  Do not use any lotions or creams on the incision until instructed by your surgeon.    ACTIVITY  o Increase activity slowly as tolerated, but follow the weight bearing instructions below.   o No driving for 6 weeks or until further direction given by your physician.  You cannot drive while taking narcotics.  o Avoid periods of inactivity such as sitting longer than an hour when not asleep. This helps prevent blood clots.  o You may return to work once you are authorized by your doctor.   WEIGHT BEARING  Maintain Sling until follow up. Other:  Non  weight bearing Right Arm  CONSTIPATION  Constipation is defined medically as fewer than  three stools per week and severe constipation as less than one stool per week.  Even if you have a regular bowel pattern at home, your normal regimen is likely to be disrupted due to multiple reasons following surgery.  Combination of anesthesia, postoperative narcotics, change in appetite and fluid intake all can affect your bowels.   YOU MUST use at least one of the following options; they are listed in order of increasing strength to get the job done.  They are all available over the counter, and you may need to use some, POSSIBLY even all of these options:    Drink plenty of fluids (prune juice may be helpful) and high fiber foods Colace 100 mg by mouth twice a day  Senokot for constipation as directed and as needed Dulcolax (bisacodyl), take with full glass of water  Miralax (polyethylene glycol) once or twice a day as needed.  If you have tried all these things and are unable to have a bowel movement in the first 3-4 days after surgery call either your surgeon or your primary doctor.    If you experience loose stools or diarrhea, hold the medications until you stool forms back up.  If your symptoms do not get better within 1 week or if they get worse, check with your doctor.  If you experience "the worst abdominal pain ever" or develop nausea or vomiting, please contact the office immediately for further recommendations  for treatment.   ITCHING:  If you experience itching with your medications, try taking only a single pain pill, or even half a pain pill at a time.  You can also use Benadryl over the counter for itching or also to help with sleep.    MEDICATIONS:  See your medication summary on the After Visit Summary that nursing will review with you.  You may have some home medications which will be placed on hold until you complete the course of blood thinner medication.  It is important for you to complete the blood thinner medication as prescribed.  PRECAUTIONS:  If you experience  chest pain or shortness of breath - call 911 immediately for transfer to the hospital emergency department.   If you develop a fever greater that 101 F, purulent drainage from wound, increased redness or drainage from wound, foul odor from the wound/dressing, or calf pain - CONTACT YOUR SURGEON.                                                   FOLLOW-UP APPOINTMENTS:  If you do not already have a post-op appointment, please call the office for an appointment to be seen by your surgeon.  Guidelines for how soon to be seen are listed in your After Visit Summary, but are typically between 1-4 weeks after surgery.   MAKE SURE YOU:   Understand these instructions.   Get help right away if you are not doing well or get worse.    Thank you for letting us be a part of your medical care team.  It is a privilege we respect greatly.  We hope these instructions will help you stay on track for a fast and full recovery!

## 2016-08-13 NOTE — Anesthesia Procedure Notes (Addendum)
Anesthesia Regional Block: Interscalene brachial plexus block   Pre-Anesthetic Checklist: ,, timeout performed, Correct Patient, Correct Site, Correct Laterality, Correct Procedure, Correct Position, site marked, Risks and benefits discussed,  Surgical consent,  Pre-op evaluation,  At surgeon's request and post-op pain management  Laterality: Upper and Right  Prep: chloraprep, alcohol swabs       Needles:  Injection technique: Single-shot  Needle Type: Stimulator Needle - 40     Needle Length: 4cm  Needle Gauge: 21   Needle insertion depth: 3 cm   Additional Needles:   Procedures: ultrasound guided,,,,,,,,   Nerve Stimulator or Paresthesia:  Response: Twitch elicited, 0.5 mA, 0.3 ms,   Additional Responses:   Narrative:  Start time: 08/13/2016 8:15 AM End time: 08/13/2016 8:31 AM Injection made incrementally with aspirations every 5 mL.  Performed by: Personally  Anesthesiologist: Shanyn Preisler  Additional Notes: Block assessed prior to start of surgery

## 2016-08-13 NOTE — Transfer of Care (Signed)
Immediate Anesthesia Transfer of Care Note  Patient: Ashley Cooke  Procedure(s) Performed: Procedure(s): TOTAL SHOULDER ARTHROPLASTY (Right)  Patient Location: PACU  Anesthesia Type:General and regional  Level of Consciousness: awake, alert  and oriented  Airway & Oxygen Therapy: Patient Spontanous Breathing and Patient connected to nasal cannula oxygen  Post-op Assessment: Report given to RN and Post -op Vital signs reviewed and stable  Post vital signs: Reviewed and stable  Last Vitals:  Vitals:   08/13/16 0830 08/13/16 0835  BP: (!) 134/56 (!) 151/60  Pulse: 75 78  Resp: 14 20  Temp:      Last Pain:  Vitals:   08/13/16 0835  TempSrc:   PainSc: 0-No pain         Complications: No apparent anesthesia complications

## 2016-08-13 NOTE — Anesthesia Procedure Notes (Signed)
Procedure Name: Intubation Date/Time: 08/13/2016 9:08 AM Performed by: Tressia Miners LEFFEW Pre-anesthesia Checklist: Patient identified, Patient being monitored, Timeout performed, Emergency Drugs available and Suction available Patient Re-evaluated:Patient Re-evaluated prior to induction Oxygen Delivery Method: Circle System Utilized Preoxygenation: Pre-oxygenation with 100% oxygen Induction Type: IV induction Ventilation: Mask ventilation without difficulty Laryngoscope Size: Miller and 2 Grade View: Grade I Tube type: Oral Tube size: 7.0 mm Number of attempts: 1 Airway Equipment and Method: Stylet Placement Confirmation: ETT inserted through vocal cords under direct vision,  positive ETCO2 and breath sounds checked- equal and bilateral Secured at: 21 cm Tube secured with: Tape Dental Injury: Teeth and Oropharynx as per pre-operative assessment

## 2016-08-13 NOTE — Op Note (Signed)
08/13/2016  10:57 AM  PATIENT:  Ashley Cooke    PRE-OPERATIVE DIAGNOSIS:  osteoarthritis,  RIGHT SHOULDER  POST-OPERATIVE DIAGNOSIS:  Same  PROCEDURE:  TOTAL SHOULDER ARTHROPLASTY  SURGEON:  Bassam Dresch, Ernesta Amble, MD  PHYSICIAN ASSISTANT: Roxan Hockey, PA-C, he was present and scrubbed throughout the case, critical for completion in a timely fashion, and for retraction, instrumentation, and closure. Carter Kitten MD  ANESTHESIA:   General  PREOPERATIVE INDICATIONS:  Ashley Cooke is a  76 y.o. female with a diagnosis of osteoarthritis,  RIGHT SHOULDER who failed conservative measures and elected for surgical management.    The risks benefits and alternatives were discussed with the patient preoperatively including but not limited to the risks of infection, bleeding, nerve injury, cardiopulmonary complications, the need for revision surgery, dislocation, loosening, incomplete relief of pain, among others, and the patient was willing to proceed.   OPERATIVE IMPLANTS: Biomet size 9 mini press-fit humeral stem, size small  humeral headwith a cemented glenoid polyethylene 3 peg implant.   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   OPERATIVE PROCEDURE: The patient was brought to the operating room and placed in the supine position. General anesthesia was administered. IV antibiotics were given.  The upper extremity was prepped and draped in usual sterile fashion. The patient was in a beachchair position with all bony prominences padded.   Time out was performed and a deltopectoral approach was carried out. The biceps tendon was tenodesed to the pectoralis tendon. The subscapularis was released, tagging it with a #2 MaxBraid, leaving a cuff of tendon for repair.   The inferior osteophyte was removed, and release of the capsule off of the humeral side was completed. The head was dislocated,  and I reamed sequentially. I placed the humeral cutting guide at 30 of retroversion, and then pinned this into place, and made my humeral neck cut. This was at the appropriate level.   I then placed deep retractors and exposed the glenoid. I excised the labrum circumferentially, taking care to protect the axillary nerve inferiorly.   I then placed a guidewire into the center position, controlling appropriate version and inclination. I then reamed over the guidewire with the small reamer, and was satisfied with the preparation. I preserved the subchondral bone in order to maximize the strength and minimize the risk for subsequent subsidence.   I then drilled the central hole for the regenerex peg, and then placed the guide, and then drilled the 3 peripheral peg holes. I had excellent bony circumferential contact.   I then cleaned the glenoid, irrigated it copiously, and then dried it and cemented the prosthesis into place. Excellent seating was achieved. I had full exposure. The cement cured, and then I turned my attention to the humeral side.   I sequentially broached, up to the selected size, with the broach set at 30 of retroversion. I then placed the real stem. I trialed with multiple heads, and the above-named component was selected. Increased posterior coverage improved the coverage. The soft tissue tension was appropriate.   I then impacted the real humeral head into place, reduced the head, and irrigated copiously. Excellent stability and range of motion was achieved. I repaired the subscapularis with 4 #2 MaxBraid, as well as the rotator interval, and irrigated copiously once more. The subcutaneous tissue was closed with Vicryl including the deltopectoral fascia.   The skin was closed with Steri-Strips and sterile gauze was applied. She  had a preoperative nerve block. She tolerated the procedure well and there were no complications.    She will mobilize for dvt px and take ASA

## 2016-08-13 NOTE — Anesthesia Postprocedure Evaluation (Signed)
Anesthesia Post Note  Patient: Ashley Cooke  Procedure(s) Performed: Procedure(s) (LRB): TOTAL SHOULDER ARTHROPLASTY (Right)     Patient location during evaluation: PACU Anesthesia Type: General and Regional Level of consciousness: awake and alert Pain management: pain level controlled Vital Signs Assessment: post-procedure vital signs reviewed and stable Respiratory status: spontaneous breathing, nonlabored ventilation, respiratory function stable and patient connected to nasal cannula oxygen Cardiovascular status: blood pressure returned to baseline and stable Postop Assessment: no signs of nausea or vomiting Anesthetic complications: no    Last Vitals:  Vitals:   08/13/16 1413 08/13/16 1449  BP: (!) 117/59 135/65  Pulse: 88 85  Resp: 19 12  Temp:  36.8 C    Last Pain:  Vitals:   08/13/16 1449  TempSrc: Oral  PainSc: 0-No pain                 Jaslynn Thome,JAMES TERRILL

## 2016-08-13 NOTE — Interval H&P Note (Signed)
History and Physical Interval Note:  08/13/2016 7:12 AM  Ashley Cooke  has presented today for surgery, with the diagnosis of OA RIGHT SHOULDER  The various methods of treatment have been discussed with the patient and family. After consideration of risks, benefits and other options for treatment, the patient has consented to  Procedure(s): TOTAL SHOULDER ARTHROPLASTY (Right) as a surgical intervention .  The patient's history has been reviewed, patient examined, no change in status, stable for surgery.  I have reviewed the patient's chart and labs.  Questions were answered to the patient's satisfaction.     [name]

## 2016-08-14 ENCOUNTER — Encounter (HOSPITAL_COMMUNITY): Payer: Self-pay | Admitting: Orthopedic Surgery

## 2016-08-14 NOTE — Discharge Summary (Signed)
Discharge Summary  Patient ID: Ashley Cooke MRN: [id number] DOB/AGE: 77-May-1942 77 y.o.  Admit date: 08/13/2016 Discharge date: 08/14/2016  Admission Diagnoses:  Primary osteoarthritis, right shoulder  Discharge Diagnoses:  Principal Problem:   Primary osteoarthritis, right shoulder Active Problems:   Pure hypercholesterolemia   Essential hypertension   Past Medical History:  Diagnosis Date  . Arthritis    "primarily in my knee & thumbs" (10/15/2014)  . GERD (gastroesophageal reflux disease)   . Hypercholesterolemia   . Hypertension   . Melanoma of lower leg ([place]) 05/2003   "left"  . Seasonal allergies   . Squamous carcinoma    "all on the left side:  under eye, nostril, bicept"    Surgeries: Procedure(s): TOTAL SHOULDER ARTHROPLASTY on 08/13/2016   Consultants (if any):   Discharged Condition: Improved  Hospital Course: Ashley Cooke is an 76 y.o. female who was admitted 08/13/2016 with a diagnosis of Primary osteoarthritis, right shoulder and went to the operating room on 08/13/2016 and underwent the above named procedures.    She was given perioperative antibiotics:  Anti-infectives    Start     Dose/Rate Route Frequency Ordered Stop   08/13/16 1530  ceFAZolin (ANCEF) IVPB 2g/100 mL premix     2 g 200 mL/hr over 30 Minutes Intravenous Every 6 hours 08/13/16 1454 08/14/16 0430   08/13/16 0815  ceFAZolin (ANCEF) IVPB 2g/100 mL premix     2 g 200 mL/hr over 30 Minutes Intravenous On call to O.R. 08/12/16 1349 08/13/16 0915    .  She was given sequential compression devices, early ambulation, and Lovenox while inpatient for DVT prophylaxis.  She will resume ASA 81 mg home medication.  She benefited maximally from the hospital stay and there were no complications.    Recent vital signs:  Vitals:   08/14/16 0058 08/14/16 0511  BP: (!) 104/57 102/63  Pulse: 81 72  Resp: 16 16  Temp: 98.7 F (37.1 C) 98.6 F (37 C)    Recent laboratory studies:  Lab Results   Component Value Date   HGB 14.0 08/02/2016   HGB 10.6 (L) 02/12/2015   HGB 11.2 (L) 02/11/2015   Lab Results  Component Value Date   WBC 6.9 08/02/2016   PLT 305 08/02/2016   Lab Results  Component Value Date   INR 1.13 01/31/2015   Lab Results  Component Value Date   NA 137 08/02/2016   K 4.0 08/02/2016   CL 104 08/02/2016   CO2 24 08/02/2016   BUN 25 (H) 08/02/2016   CREATININE 0.99 08/02/2016   GLUCOSE 70 08/02/2016    Discharge Medications:   Allergies as of 08/14/2016      Reactions   No Known Allergies       Medication List    TAKE these medications   ALEVE 220 MG Caps Generic drug:  Naproxen Sodium Take 220 mg by mouth 2 (two) times daily.   ARTIFICIAL TEARS OP Apply 1 drop to eye 4 (four) times daily as needed (dry eyes).   aspirin EC 81 MG tablet Take 81 mg by mouth daily.   atorvastatin 10 MG tablet Commonly known as:  LIPITOR Take 10 mg by mouth every evening.   baclofen 10 MG tablet Commonly known as:  LIORESAL Take 1 tablet (10 mg total) by mouth 3 (three) times daily as needed for muscle spasms.   CALCIUM 500 + D PO Take 1 tablet by mouth daily.   cholecalciferol 1000 units tablet Commonly known  as:  VITAMIN D Take 1,000 Units by mouth daily.   docusate sodium 100 MG capsule Commonly known as:  COLACE Take 1 capsule (100 mg total) by mouth 2 (two) times daily. To prevent constipation while taking pain medication.   famotidine 20 MG tablet Commonly known as:  PEPCID Take 20 mg by mouth daily.   ketotifen 0.025 % ophthalmic solution Commonly known as:  ZADITOR Place 1 drop into both eyes 2 (two) times daily as needed (allergies).   loratadine 10 MG tablet Commonly known as:  CLARITIN Take 10 mg by mouth daily as needed (seasonal allergies).   losartan 100 MG tablet Commonly known as:  COZAAR Take 50 mg by mouth daily.   multivitamin with minerals Tabs tablet Take 1 tablet by mouth daily.   ondansetron 4 MG tablet Commonly  known as:  ZOFRAN Take 1 tablet (4 mg total) by mouth every 8 (eight) hours as needed for nausea or vomiting.   oxyCODONE-acetaminophen 5-325 MG tablet Commonly known as:  ROXICET Take 1-2 tablets by mouth every 4 (four) hours as needed for severe pain.   vitamin C 500 MG tablet Commonly known as:  ASCORBIC ACID Take 500 mg by mouth daily.       Diagnostic Studies: Ct Shoulder Right Wo Contrast  Result Date: 07/17/2016 CLINICAL DATA:  Preoperative assessment prior to shoulder arthroplasty. EXAM: CT OF THE UPPER RIGHT EXTREMITY WITHOUT CONTRAST TECHNIQUE: Multidetector CT imaging of the upper right extremity was performed according to the standard protocol. COMPARISON:  None. FINDINGS: Bones/Joint/Cartilage No fracture or dislocation. Normal alignment. Large joint effusion. Severe joint space narrowing of the glenohumeral joint with a bone-on-bone appearance, subchondral sclerosis, subchondral cystic changes and marginal osteophytosis. Mild arthropathy of the acromioclavicular joint. No aggressive osseous lesion. Ligaments Ligaments are suboptimally evaluated by CT. Muscles and Tendons Muscles are normal.  No muscle atrophy. Soft tissue No fluid collection or hematoma.  No soft tissue mass. IMPRESSION: 1. Severe osteoarthritis of the right glenohumeral joint. Electronically Signed   By: Kathreen Devoid   On: 07/17/2016 14:49   Dg Shoulder Right Port  Result Date: 08/13/2016 CLINICAL DATA:  Status post right shoulder replacement. EXAM: PORTABLE RIGHT SHOULDER COMPARISON:  None. FINDINGS: Right shoulder arthroplasty appears to be well situated. No fracture or dislocation is noted. Expected postoperative changes are seen in the surrounding soft tissues. IMPRESSION: Status post right shoulder arthroplasty. Electronically Signed   By: Marijo Conception, M.D.   On: 08/13/2016 12:21    Disposition: 06-Home-Health Care Svc    Follow-up Information    Renette Butters, MD Follow up.   Specialty:   Orthopedic Surgery Contact information: [address] [place] [address] [phone]            Signed: Prudencio Burly III PA-C 08/14/2016, 7:22 AM

## 2016-08-14 NOTE — Progress Notes (Signed)
   Assessment / Plan: 1 Day Post-Op  S/P Procedure(s) (LRB): TOTAL SHOULDER ARTHROPLASTY (Right) by Dr. Ernesta Amble. Murphy on 08/13/16  Principal Problem:   Primary osteoarthritis, right shoulder Active Problems:   Pure hypercholesterolemia   Essential hypertension   Advance diet Up with therapy D/C IV fluids Incentive Spirometry Apply ice  Weight Bearing: Non Weight Bearing (NWB) RUE Dressings: Maintain Aquacel.  VTE prophylaxis: Lovenox  inpatient, SCDs, ambulation.  Mobilize at home.  Resume home med ASA 81 mg daily Dispo: Home today.  Subjective: Doing well POD1.  No complaints.  Patient reports pain as moderate. Tolerating diet.  Urinating.  No CP, SOB.    Objective:   VITALS:   Vitals:   08/13/16 1449 08/13/16 2154 08/14/16 0058 08/14/16 0511  BP: 135/65 (!) 107/55 (!) 104/57 102/63  Pulse: 85 87 81 72  Resp: 12 16 16 16   Temp: 98.2 F (36.8 C) 98.6 F (37 C) 98.7 F (37.1 C) 98.6 F (37 C)  TempSrc: Oral Oral Oral Oral  SpO2: 97% 99% 98% 96%  Weight:      Height:       CBC Latest Ref Rng & Units 08/02/2016 02/12/2015 02/11/2015  WBC 4.0 - 10.5 K/uL 6.9 9.3 8.2  Hemoglobin 12.0 - 15.0 g/dL 14.0 10.6(L) 11.2(L)  Hematocrit 36.0 - 46.0 % 41.5 31.3(L) 32.9(L)  Platelets 150 - 400 K/uL 305 199 227   BMP Latest Ref Rng & Units 08/02/2016 02/11/2015 01/31/2015  Glucose 65 - 99 mg/dL 70 110(H) 99  BUN 6 - 20 mg/dL 25(H) 16 25(H)  Creatinine 0.44 - 1.00 mg/dL 0.99 0.92 1.04(H)  Sodium 135 - 145 mmol/L 137 136 140  Potassium 3.5 - 5.1 mmol/L 4.0 4.0 4.3  Chloride 101 - 111 mmol/L 104 104 103  CO2 22 - 32 mmol/L 24 27 25   Calcium 8.9 - 10.3 mg/dL 9.4 8.7(L) 9.9   Intake/Output      07/24 0701 - 07/25 0700 07/25 0701 - 07/26 0700   P.O. 480    I.V. (mL/kg) 1300 (17.9)    Total Intake(mL/kg) 1780 (24.5)    Urine (mL/kg/hr) 250 (0.1)    Blood 50    Total Output 300     Net +1480          Urine Occurrence 5 x      Physical Exam: General: NAD.  Upright in  bed.  Calm, conversant. Resp: No increased wob Cardio: regular rate and rhythm ABD soft Neurologically intact MSK Neurovascularly intact Sensation intact distally Hand warm Incision: dressing C/D/I   Prudencio Burly III, PA-C 08/14/2016, 7:18 AM

## 2016-08-14 NOTE — Care Management Note (Signed)
Case Management Note  Patient Details  Name: Ashley Cooke MRN: [id number] Date of Birth: 1940/10/12  Subjective/Objective:    Right TSA                Action/Plan:  Discharge Planning:  Chart reviewed. No NCM needs identified.   PCP Domenick Gong MD  Expected Discharge Date:  08/14/16               Expected Discharge Plan:  Home/Self Care  In-House Referral:  NA  Discharge planning Services  CM Consult  Post Acute Care Choice:  NA Choice offered to:  NA  DME Arranged:  N/A DME Agency:  NA  HH Arranged:  NA HH Agency:  NA  Status of Service:  Completed, signed off  If discussed at [facility] of Stay Meetings, dates discussed:    Additional Comments:  Erenest Rasher, RN 08/14/2016, 11:48 AM

## 2016-08-14 NOTE — Progress Notes (Signed)
Reviewed discharge papers and medications with Mrs Guadron and her husband with full understanding

## 2016-08-22 ENCOUNTER — Other Ambulatory Visit: Payer: Self-pay | Admitting: Certified Nurse Midwife

## 2016-08-22 DIAGNOSIS — Z1231 Encounter for screening mammogram for malignant neoplasm of breast: Secondary | ICD-10-CM

## 2016-10-09 ENCOUNTER — Ambulatory Visit
Admission: RE | Admit: 2016-10-09 | Discharge: 2016-10-09 | Disposition: A | Discharge status: Home / Self Care | Payer: Medicare Other | Source: Ambulatory Visit | Attending: Certified Nurse Midwife | Admitting: Certified Nurse Midwife

## 2016-10-09 DIAGNOSIS — Z1231 Encounter for screening mammogram for malignant neoplasm of breast: Secondary | ICD-10-CM

## 2016-10-28 ENCOUNTER — Ambulatory Visit: Payer: Medicare Other | Admitting: Nurse Practitioner

## 2016-10-30 ENCOUNTER — Ambulatory Visit (INDEPENDENT_AMBULATORY_CARE_PROVIDER_SITE_OTHER): Payer: Medicare Other | Admitting: Certified Nurse Midwife

## 2016-10-30 ENCOUNTER — Other Ambulatory Visit (HOSPITAL_COMMUNITY)
Admission: RE | Admit: 2016-10-30 | Discharge: 2016-10-30 | Disposition: A | Discharge status: Home / Self Care | Payer: Medicare Other | Source: Ambulatory Visit | Attending: Obstetrics & Gynecology | Admitting: Obstetrics & Gynecology

## 2016-10-30 ENCOUNTER — Encounter: Payer: Self-pay | Admitting: Certified Nurse Midwife

## 2016-10-30 VITALS — BP 120/72 | HR 68 | Resp 16 | Ht 62.75 in | Wt 156.0 lb

## 2016-10-30 DIAGNOSIS — Z124 Encounter for screening for malignant neoplasm of cervix: Secondary | ICD-10-CM | POA: Insufficient documentation

## 2016-10-30 DIAGNOSIS — Z01419 Encounter for gynecological examination (general) (routine) without abnormal findings: Secondary | ICD-10-CM | POA: Diagnosis not present

## 2016-10-30 DIAGNOSIS — Z780 Asymptomatic menopausal state: Secondary | ICD-10-CM

## 2016-10-30 NOTE — Progress Notes (Signed)
76 y.o. [id number] Married  Caucasian Fe here for annual exam. Menopausal, denies vaginal bleeding. Recent right shoulder replacement 2 months ago, doing well Plans to be back at tennis by next spring! Sees Dr. Osborne Casco for aex and labs. All normal per patient. No health issues today.   Patient's last menstrual period was 01/21/1977 (approximate).          Sexually active: Yes.    The current method of family planning is none.    Exercising: Yes.    tennis, tai chi, walking & gym Smoker:  no  Health Maintenance: Pap:  10-12-14 neg History of Abnormal Pap: no MMG:  10-09-16 category b density birads 1:neg Self Breast exams: yes Colonoscopy:  2017 normal, no repeat needed. BMD:   2016 with dr Osborne Casco normal TDaP:  2010 Shingles: 2007 Pneumonia: 2015 Hep C and HIV: not done Labs: pcp   reports that she quit smoking about 37 years ago. Her smoking use included Cigarettes. She has a 11.00 pack-year smoking history. She has never used smokeless tobacco. She reports that she drinks about 4.2 oz of alcohol per week . She reports that she does not use drugs.  Past Medical History:  Diagnosis Date  . Arthritis    "primarily in my knee & thumbs" (10/15/2014)  . GERD (gastroesophageal reflux disease)   . Hypercholesterolemia   . Hypertension   . Melanoma of lower leg ([facility]) 05/2003   "left"  . Seasonal allergies   . Squamous carcinoma    "all on the left side:  under eye, nostril, bicept"    Past Surgical History:  Procedure Laterality Date  . DILATION AND CURETTAGE OF UTERUS  1968  . JOINT REPLACEMENT Bilateral    knee-    . KNEE ARTHROSCOPY Left 05/06/2012   torn miniscus  . MELANOMA EXCISION Left 05/2003   leg  . MOHS SURGERY Left 2000's   "squamous; under eye and nostril"  . SQUAMOUS CELL CARCINOMA EXCISION Left 2008   upper bicept  . TONSILLECTOMY AND ADENOIDECTOMY  1949  . TOTAL KNEE ARTHROPLASTY Left 10/14/2014  . TOTAL KNEE ARTHROPLASTY Left 10/14/2014   Procedure: TOTAL KNEE  ARTHROPLASTY;  Surgeon: Earlie Server, MD;  Location: [facility];  Service: Orthopedics;  Laterality: Left;  . TOTAL KNEE ARTHROPLASTY Right 02/10/2015   Procedure: RIGHT TOTAL KNEE ARTHROPLASTY;  Surgeon: Earlie Server, MD;  Location: [facility];  Service: Orthopedics;  Laterality: Right;  . TOTAL SHOULDER ARTHROPLASTY Right 08/13/2016   Procedure: TOTAL SHOULDER ARTHROPLASTY;  Surgeon: Renette Butters, MD;  Location: [facility];  Service: Orthopedics;  Laterality: Right;    Current Outpatient Prescriptions  Medication Sig Dispense Refill  . aspirin EC 81 MG tablet Take 81 mg by mouth daily.    Marland Kitchen atorvastatin (LIPITOR) 10 MG tablet Take 10 mg by mouth every evening.     . baclofen (LIORESAL) 10 MG tablet Take 1 tablet (10 mg total) by mouth 3 (three) times daily as needed for muscle spasms. 40 each 0  . Calcium Carbonate-Vitamin D (CALCIUM 500 + D PO) Take 1 tablet by mouth daily.    . cholecalciferol (VITAMIN D) 1000 UNITS tablet Take 1,000 Units by mouth daily.     Marland Kitchen docusate sodium (COLACE) 100 MG capsule Take 1 capsule (100 mg total) by mouth 2 (two) times daily. To prevent constipation while taking pain medication. 60 capsule 0  . famotidine (PEPCID) 20 MG tablet Take 20 mg by mouth daily.     . Hypromellose (ARTIFICIAL TEARS  OP) Apply 1 drop to eye 4 (four) times daily as needed (dry eyes).    Marland Kitchen ketotifen (ZADITOR) 0.025 % ophthalmic solution Place 1 drop into both eyes 2 (two) times daily as needed (allergies).    . loratadine (CLARITIN) 10 MG tablet Take 10 mg by mouth daily as needed (seasonal allergies).     . losartan (COZAAR) 100 MG tablet Take 50 mg by mouth daily.     . Multiple Vitamin (MULTIVITAMIN WITH MINERALS) TABS tablet Take 1 tablet by mouth daily.    . Naproxen Sodium (ALEVE) 220 MG CAPS Take 220 mg by mouth 2 (two) times daily.     . ondansetron (ZOFRAN) 4 MG tablet Take 1 tablet (4 mg total) by mouth every 8 (eight) hours as needed for nausea or vomiting. 40 tablet 0  .  oxyCODONE-acetaminophen (ROXICET) 5-325 MG tablet Take 1-2 tablets by mouth every 4 (four) hours as needed for severe pain. 60 tablet 0  . vitamin C (ASCORBIC ACID) 500 MG tablet Take 500 mg by mouth daily.      Current Facility-Administered Medications  Medication Dose Route Frequency Provider Last Rate Last Dose  . 0.9 %  sodium chloride infusion  500 mL Intravenous Continuous Armbruster, Carlota Raspberry, MD        Family History  Problem Relation Age of Onset  . Hypertension Mother   . Hypertension Sister 45  . Peripheral Artery Disease Sister   . Hypertension Maternal Grandfather   . Colon cancer Neg Hx   . Breast cancer Neg Hx     ROS:  Pertinent items are noted in HPI.  Otherwise, a comprehensive ROS was negative.  Exam:   LMP 01/21/1977 (Approximate)    Ht Readings from Last 3 Encounters:  08/13/16 '5\' 3"'  (1.6 m)  08/02/16 '5\' 3"'  (1.6 m)  12/29/15 5' 3.5" (1.613 m)    General appearance: alert, cooperative and appears stated age Head: Normocephalic, without obvious abnormality, atraumatic Neck: no adenopathy, supple, symmetrical, trachea midline and thyroid normal to inspection and palpation Lungs: clear to auscultation bilaterally Breasts: normal appearance, no masses or tenderness, No nipple retraction or dimpling, No nipple discharge or bleeding, No axillary or supraclavicular adenopathy Heart: regular rate and rhythm Abdomen: soft, non-tender; no masses,  no organomegaly Extremities: extremities normal, atraumatic, no cyanosis or edema Skin: Skin color, texture, turgor normal. No rashes or lesions Lymph nodes: Cervical, supraclavicular, and axillary nodes normal. No abnormal inguinal nodes palpated Neurologic: Grossly normal   Pelvic: External genitalia:  no lesions              Urethra:  normal appearing urethra with no masses, tenderness or lesions              Bartholin's and Skene's: normal                 Vagina: normal appearing vagina with normal color and  discharge, no lesions              Cervix: multiparous appearance, no cervical motion tenderness and no lesions              Pap taken: Yes.   Bimanual Exam:  Uterus:  normal size, contour, position, consistency, mobility, non-tender              Adnexa: normal adnexa and no mass, fullness, tenderness               Rectovaginal: Confirms  Anus:  normal sphincter tone, no lesions  Chaperone present: yes  A:  Well Woman with normal exam  Post Menopausal no HRT  Recent right shoulder replacement  Hypertension,cholesterol/vitamin d management with PCP  P:   Reviewed health and wellness pertinent to exam  Aware of need to evaluate if vaginal bleeding  Continue follow up with MD  As indicated  Pap smear: yes  counseled on breast self exam, mammography screening, feminine hygiene, menopause, adequate intake of calcium and vitamin D, diet and exercise  return annually or prn  An After Visit Summary was printed and given to the patient.

## 2016-10-30 NOTE — Patient Instructions (Signed)

## 2016-11-01 LAB — CYTOLOGY - PAP: Diagnosis: NEGATIVE

## 2017-02-26 ENCOUNTER — Other Ambulatory Visit: Payer: Self-pay | Admitting: Orthopedic Surgery

## 2017-02-26 DIAGNOSIS — M25512 Pain in left shoulder: Secondary | ICD-10-CM

## 2017-02-27 DIAGNOSIS — M19012 Primary osteoarthritis, left shoulder: Secondary | ICD-10-CM | POA: Diagnosis present

## 2017-02-27 NOTE — H&P (Signed)
PREOPERATIVE H&P Patient ID: Ashley Cooke MRN: [id number] DOB/AGE: 06/06/1940 77 y.o.  Chief Complaint: OA LEFT SHOULDER  Planned Procedure Date: 03/18/17 Medical Clearance by Dr. Osborne Casco   HPI: Ashley Cooke is a 77 y.o. female who presents for evaluation of OA LEFT SHOULDER. The patient has a history of pain and functional disability in the left shoulder due to arthritis and has failed non-surgical conservative treatments for greater than 12 weeks to include NSAID's and/or analgesics, corticosteriod injections and activity modification.  Onset of symptoms was gradual, starting 2 years ago with gradually worsening course since that time.   Patient currently rates pain at 7 out of 10 with activity. Patient has night pain, worsening of pain with activity and weight bearing, pain that interferes with activities of daily living and pain with passive range of motion.  Patient has evidence of subchondral cysts, periarticular osteophytes and joint space narrowing by imaging studies.  There is no active infection.  Past Medical History:  Diagnosis Date  . Arthritis    "primarily in my knee & thumbs" (10/15/2014)  . GERD (gastroesophageal reflux disease)   . Hypercholesterolemia   . Hypertension   . Melanoma of lower leg ([facility]) 05/2003   "left"  . Seasonal allergies   . Squamous carcinoma    "all on the left side:  under eye, nostril, bicept"   Past Surgical History:  Procedure Laterality Date  . DILATION AND CURETTAGE OF UTERUS  1968  . JOINT REPLACEMENT Bilateral    knee-    . KNEE ARTHROSCOPY Left 05/06/2012   torn miniscus  . MELANOMA EXCISION Left 05/2003   leg  . MOHS SURGERY Left 2000's   "squamous; under eye and nostril"  . SQUAMOUS CELL CARCINOMA EXCISION Left 2008   upper bicept  . TONSILLECTOMY AND ADENOIDECTOMY  1949  . TOTAL KNEE ARTHROPLASTY Left 10/14/2014  . TOTAL KNEE ARTHROPLASTY Left 10/14/2014   Procedure: TOTAL KNEE ARTHROPLASTY;  Surgeon: Earlie Server, MD;  Location:  [facility];  Service: Orthopedics;  Laterality: Left;  . TOTAL KNEE ARTHROPLASTY Right 02/10/2015   Procedure: RIGHT TOTAL KNEE ARTHROPLASTY;  Surgeon: Earlie Server, MD;  Location: [facility];  Service: Orthopedics;  Laterality: Right;  . TOTAL SHOULDER ARTHROPLASTY Right 08/13/2016   Procedure: TOTAL SHOULDER ARTHROPLASTY;  Surgeon: Renette Butters, MD;  Location: [facility];  Service: Orthopedics;  Laterality: Right;   Allergies  Allergen Reactions  . No Known Allergies    Medications: Losartan 50 mg daily Atorvastatin 10 mg daily Multivitamin daily Vitamin D 1000 units daily Vitamin C 500 mg daily Calcium 600 mg daily 81 mg aspirin daily Ranitidine 20 mg daily Loratadine 10 mg daily seasonally Naproxen sodium 220 mg twice daily as needed  Social History: Married former smoker.  She quit in 1977.  One pack per day times 10 years.  One glass of wine with dinner.  Family History  Problem Relation Age of Onset  . Hypertension Mother   . Hypertension Sister 46  . Peripheral Artery Disease Sister   . Hypertension Maternal Grandfather   . Colon cancer Neg Hx   . Breast cancer Neg Hx     ROS: Currently denies lightheadedness, dizziness, Fever, chills, CP, SOB.   No personal history of DVT, PE, MI, or CVA. No loose teeth or dentures All other systems have been reviewed and were otherwise currently negative with the exception of those mentioned in the HPI and as above.  Objective: Vitals: Ht: 5 feet 4 inches wt: 163  temp: 97.6 BP: 153/81 pulse: 70 O2 96 % on room air. Physical Exam: General: Alert, NAD.   HEENT: EOMI, Good Neck Extension  Pulm: No increased work of breathing.  Clear B/L A/P w/o crackle or wheeze.  CV: RRR, No m/g/r appreciated  GI: soft, NT, ND Neuro: Neuro without gross focal deficit.  Sensation intact distally Skin: No lesions in the area of chief complaint MSK/Surgical Site: Left shoulder w/o erythema or signs of infection.   Non tender to palpation.  Forward  elevation/abduction to about 150 degrees. External rotation to 15 left vs 45 degrees on Right.  Internal rotation low lumbar on left vs mid-back on right. Negative empty can.  NVI.   Imaging Review Plain radiographs demonstrate severe degenerative joint disease of the left shoulder.  CT pending.  Assessment: OA LEFT SHOULDER Principal Problem:   Primary osteoarthritis, left shoulder Active Problems:   Pure hypercholesterolemia   Essential hypertension   Plan: Plan for Procedure(s): TOTAL SHOULDER ARTHROPLASTY  The patient history, physical exam, clinical judgement of the provider and imaging are consistent with end stage degenerative joint disease and total joint arthroplasty is deemed medically necessary. The treatment options including medical management, injection therapy, and arthroplasty were discussed at length. The risks and benefits of Procedure(s): TOTAL SHOULDER ARTHROPLASTY were presented and reviewed.  The risks of nonoperative treatment, versus surgical intervention including but not limited to continued pain, aseptic loosening, stiffness, dislocation/subluxation, infection, bleeding, nerve injury, blood clots, cardiopulmonary complications, morbidity, mortality, among others were discussed. The patient verbalizes understanding and wishes to proceed with the plan.  Patient is being admitted for inpatient treatment for surgery, pain control, PT, OT, prophylactic antibiotics, VTE prophylaxis, progressive ambulation, ADL's and discharge planning.   Dental prophylaxis discussed and recommended for 2 years postoperatively.   The patient does meet the criteria for TXA which will be used perioperatively via IV.    ASA 81 mg will continue postoperatively.  DVT prophylaxis: SCDs, and early ambulation.  The patient is planning to be discharged home in care of her husband with OPPT.   Severity of Illness: The appropriate patient status for this patient is OBSERVATION. Observation  status is judged to be reasonable and necessary in order to provide the required intensity of service to ensure the patient's safety. The patient's presenting symptoms, physical exam findings, and initial radiographic and laboratory data in the context of their medical condition is felt to place them at decreased risk for further clinical deterioration. Furthermore, it is anticipated that the patient will be medically stable for discharge from the hospital within 2 midnights of admission. The following factors support the patient status of observation.    Prudencio Burly III, PA-C 02/27/2017 5:04 PM

## 2017-03-03 ENCOUNTER — Other Ambulatory Visit: Payer: Medicare Other

## 2017-03-06 ENCOUNTER — Ambulatory Visit
Admission: RE | Admit: 2017-03-06 | Discharge: 2017-03-06 | Disposition: A | Discharge status: Home / Self Care | Payer: Medicare Other | Source: Ambulatory Visit | Attending: Orthopedic Surgery | Admitting: Orthopedic Surgery

## 2017-03-06 DIAGNOSIS — M25512 Pain in left shoulder: Secondary | ICD-10-CM

## 2017-03-06 NOTE — Pre-Procedure Instructions (Signed)
Ashley Cooke  03/06/2017      [id number] - [place], [place] [address] [place] [address] Phone: [phone] Fax: [phone]    Your procedure is scheduled on Feb 26.  Report to [facility] Admitting at 230pm  Call this number if you have problems the morning of surgery:  [phone]   Remember:  Do not eat food or drink liquids after midnight.  Take these medicines the morning of surgery with A SIP OF WATER Famotidine (Pepcid), Eye drops, Loratadine (Claritin) if needed.  Stop taking aspirin as directed by your Dr. Stop taking BC's, Goody's, Herbal medications, Fish Oil, Ibuprofen, Advil, Motrin, Aleve, Vitamins    Do not wear jewelry, make-up or nail polish.  Do not wear lotions, powders, or perfumes, or deodorant.  Do not shave 48 hours prior to surgery.  Men may shave face and neck.  Do not bring valuables to the hospital.  [facility] is not responsible for any belongings or valuables.  Contacts, dentures or bridgework may not be worn into surgery.  Leave your suitcase in the car.  After surgery it may be brought to your room.  For patients admitted to the hospital, discharge time will be determined by your treatment team.  Patients discharged the day of surgery will not be allowed to drive home.   Special instructions:  [place] - Preparing for Surgery  Before surgery, you can play an important role.  Because skin is not sterile, your skin needs to be as free of germs as possible.  You can reduce the number of germs on you skin by washing with CHG (chlorahexidine gluconate) soap before surgery.  CHG is an antiseptic cleaner which kills germs and bonds with the skin to continue killing germs even after washing.  Please DO NOT use if you have an allergy to CHG or antibacterial soaps.  If your skin becomes reddened/irritated stop using the CHG and inform your nurse when you arrive  at Short Stay.  Do not shave (including legs and underarms) for at least 48 hours prior to the first CHG shower.  You may shave your face.  Please follow these instructions carefully:   1.  Shower with CHG Soap the night before surgery and the   morning of Surgery.  2.  If you choose to wash your hair, wash your hair first as usual with your   normal shampoo.  3.  After you shampoo, rinse your hair and body thoroughly to remove the   Shampoo.  4.  Use CHG as you would any other liquid soap.  You can apply chg directly   to the skin and wash gently with scrungie or a clean washcloth.  5.  Apply the CHG Soap to your body ONLY FROM THE NECK DOWN.    Do not use on open wounds or open sores.  Avoid contact with your eyes,  ears, mouth and genitals (private parts).  Wash genitals (private parts) with your normal soap.  6.  Wash thoroughly, paying special attention to the area where your surgery  will be performed.  7.  Thoroughly rinse your body with warm water from the neck down.  8.  DO NOT shower/wash with your normal soap after using and rinsing off the CHG Soap.  9.  Pat yourself dry with a clean towel.            10.  Wear  clean pajamas.            11.  Place clean sheets on your bed the night of your first shower and do not sleep with pets.  Day of Surgery  Do not apply any lotions/deoderants the morning of surgery.  Please wear clean clothes to the hospital/surgery center.     Please read over the following fact sheets that you were given. Pain Booklet, Coughing and Deep Breathing, MRSA Information and Surgical Site Infection Prevention

## 2017-03-07 ENCOUNTER — Other Ambulatory Visit: Payer: Self-pay

## 2017-03-07 ENCOUNTER — Encounter (HOSPITAL_COMMUNITY): Payer: Self-pay

## 2017-03-07 ENCOUNTER — Encounter (HOSPITAL_COMMUNITY)
Admission: RE | Admit: 2017-03-07 | Discharge: 2017-03-07 | Disposition: A | Discharge status: Home / Self Care | Payer: Medicare Other | Source: Ambulatory Visit | Attending: Orthopedic Surgery | Admitting: Orthopedic Surgery

## 2017-03-07 DIAGNOSIS — Z01818 Encounter for other preprocedural examination: Secondary | ICD-10-CM | POA: Diagnosis present

## 2017-03-07 DIAGNOSIS — M25512 Pain in left shoulder: Secondary | ICD-10-CM | POA: Insufficient documentation

## 2017-03-07 LAB — CBC
HCT: 43.4 % (ref 36.0–46.0)
Hemoglobin: 14.5 g/dL (ref 12.0–15.0)
MCH: 31.9 pg (ref 26.0–34.0)
MCHC: 33.4 g/dL (ref 30.0–36.0)
MCV: 95.6 fL (ref 78.0–100.0)
Platelets: 308 10*3/uL (ref 150–400)
RBC: 4.54 MIL/uL (ref 3.87–5.11)
RDW: 14.8 % (ref 11.5–15.5)
WBC: 6.5 10*3/uL (ref 4.0–10.5)

## 2017-03-07 LAB — BASIC METABOLIC PANEL
ANION GAP: 11 (ref 5–15)
BUN: 22 mg/dL — AB (ref 6–20)
CALCIUM: 9.7 mg/dL (ref 8.9–10.3)
CO2: 24 mmol/L (ref 22–32)
Chloride: 104 mmol/L (ref 101–111)
Creatinine, Ser: 1.03 mg/dL — ABNORMAL HIGH (ref 0.44–1.00)
GFR calc Af Amer: 59 mL/min — ABNORMAL LOW (ref >60)
GFR, EST NON AFRICAN AMERICAN: 51 mL/min — AB (ref >60)
GLUCOSE: 93 mg/dL (ref 65–99)
Potassium: 4.6 mmol/L (ref 3.5–5.1)
SODIUM: 139 mmol/L (ref 135–145)

## 2017-03-07 LAB — SURGICAL PCR SCREEN
MRSA, PCR: NEGATIVE
STAPHYLOCOCCUS AUREUS: NEGATIVE

## 2017-03-07 NOTE — Progress Notes (Signed)
PCP is Dr. Domenick Gong Denies ever seeing a cardiologist. Denies chest pain, fever, or cough. See Willeen Cass note 08-02-16 Reports last dose of Aspirin  will be 03-11-17 Denies ever having a card cath, stress test, or echo.

## 2017-03-17 MED ORDER — SODIUM CHLORIDE 0.9 % IV SOLN
1000.0000 mg | INTRAVENOUS | Status: AC
  Administered 2017-03-18: 1000 mg via INTRAVENOUS
  Filled 2017-03-17: qty 1010

## 2017-03-18 ENCOUNTER — Observation Stay (HOSPITAL_COMMUNITY): Payer: Medicare Other

## 2017-03-18 ENCOUNTER — Encounter (HOSPITAL_COMMUNITY): Payer: Self-pay | Admitting: Anesthesiology

## 2017-03-18 ENCOUNTER — Inpatient Hospital Stay (HOSPITAL_COMMUNITY)
Admission: RE | Admit: 2017-03-18 | Discharge: 2017-03-19 | DRG: 483 | Disposition: A | Discharge status: Home / Self Care | Payer: Medicare Other | Source: Ambulatory Visit | Attending: Orthopedic Surgery | Admitting: Orthopedic Surgery

## 2017-03-18 ENCOUNTER — Inpatient Hospital Stay (HOSPITAL_COMMUNITY): Payer: Medicare Other | Admitting: Anesthesiology

## 2017-03-18 ENCOUNTER — Encounter (HOSPITAL_COMMUNITY)
Admission: RE | Disposition: A | Discharge status: Home / Self Care | Payer: Self-pay | Source: Ambulatory Visit | Attending: Orthopedic Surgery

## 2017-03-18 DIAGNOSIS — Z79899 Other long term (current) drug therapy: Secondary | ICD-10-CM

## 2017-03-18 DIAGNOSIS — E7800 Pure hypercholesterolemia, unspecified: Secondary | ICD-10-CM | POA: Diagnosis present

## 2017-03-18 DIAGNOSIS — Z85820 Personal history of malignant melanoma of skin: Secondary | ICD-10-CM

## 2017-03-18 DIAGNOSIS — Z96612 Presence of left artificial shoulder joint: Secondary | ICD-10-CM

## 2017-03-18 DIAGNOSIS — K219 Gastro-esophageal reflux disease without esophagitis: Secondary | ICD-10-CM | POA: Diagnosis present

## 2017-03-18 DIAGNOSIS — Z87891 Personal history of nicotine dependence: Secondary | ICD-10-CM

## 2017-03-18 DIAGNOSIS — Z7982 Long term (current) use of aspirin: Secondary | ICD-10-CM

## 2017-03-18 DIAGNOSIS — Z96611 Presence of right artificial shoulder joint: Secondary | ICD-10-CM | POA: Diagnosis present

## 2017-03-18 DIAGNOSIS — Z96653 Presence of artificial knee joint, bilateral: Secondary | ICD-10-CM | POA: Diagnosis present

## 2017-03-18 DIAGNOSIS — I10 Essential (primary) hypertension: Secondary | ICD-10-CM | POA: Diagnosis present

## 2017-03-18 DIAGNOSIS — Z8249 Family history of ischemic heart disease and other diseases of the circulatory system: Secondary | ICD-10-CM

## 2017-03-18 DIAGNOSIS — M19012 Primary osteoarthritis, left shoulder: Principal | ICD-10-CM | POA: Diagnosis present

## 2017-03-18 DIAGNOSIS — J302 Other seasonal allergic rhinitis: Secondary | ICD-10-CM | POA: Diagnosis present

## 2017-03-18 HISTORY — PX: TOTAL SHOULDER ARTHROPLASTY: SHX126

## 2017-03-18 SURGERY — ARTHROPLASTY, SHOULDER, TOTAL
Anesthesia: General | Site: Shoulder | Laterality: Left

## 2017-03-18 MED ORDER — POLYETHYLENE GLYCOL 3350 17 G PO PACK: 17.0000 g | PACK | Freq: Every day | ORAL | Status: DC | PRN

## 2017-03-18 MED ORDER — ONDANSETRON HCL 4 MG PO TABS: 4.0000 mg | ORAL_TABLET | Freq: Three times a day (TID) | ORAL | 0 refills | Status: DC | PRN

## 2017-03-18 MED ORDER — CHLORHEXIDINE GLUCONATE 4 % EX LIQD: 60.0000 mL | Freq: Once | CUTANEOUS | Status: DC

## 2017-03-18 MED ORDER — FENTANYL CITRATE (PF) 100 MCG/2ML IJ SOLN
INTRAMUSCULAR | Status: AC
  Administered 2017-03-18: 50 ug via INTRAVENOUS
  Filled 2017-03-18: qty 2

## 2017-03-18 MED ORDER — CEFAZOLIN SODIUM-DEXTROSE 2-4 GM/100ML-% IV SOLN
2.0000 g | INTRAVENOUS | Status: AC
  Administered 2017-03-18: 2 g via INTRAVENOUS

## 2017-03-18 MED ORDER — ASPIRIN EC 81 MG PO TBEC: 81.0000 mg | DELAYED_RELEASE_TABLET | Freq: Every day | ORAL | Status: DC

## 2017-03-18 MED ORDER — KETOROLAC TROMETHAMINE 15 MG/ML IJ SOLN
7.5000 mg | Freq: Four times a day (QID) | INTRAMUSCULAR | Status: AC
  Administered 2017-03-18 – 2017-03-19 (×4): 7.5 mg via INTRAVENOUS
  Filled 2017-03-18 (×3): qty 1

## 2017-03-18 MED ORDER — CEFAZOLIN SODIUM-DEXTROSE 1-4 GM/50ML-% IV SOLN
1.0000 g | Freq: Four times a day (QID) | INTRAVENOUS | Status: AC
  Administered 2017-03-18 – 2017-03-19 (×3): 1 g via INTRAVENOUS
  Filled 2017-03-18 (×3): qty 50

## 2017-03-18 MED ORDER — PHENYLEPHRINE HCL 10 MG/ML IJ SOLN
INTRAVENOUS | Status: DC | PRN
  Administered 2017-03-18: 50 ug/min via INTRAVENOUS

## 2017-03-18 MED ORDER — HYDROCODONE-ACETAMINOPHEN 5-325 MG PO TABS: 1.0000 | ORAL_TABLET | ORAL | 0 refills | Status: DC | PRN

## 2017-03-18 MED ORDER — ONDANSETRON HCL 4 MG PO TABS: 4.0000 mg | ORAL_TABLET | Freq: Four times a day (QID) | ORAL | Status: DC | PRN

## 2017-03-18 MED ORDER — METOCLOPRAMIDE HCL 5 MG/ML IJ SOLN: 5.0000 mg | Freq: Three times a day (TID) | INTRAMUSCULAR | Status: DC | PRN

## 2017-03-18 MED ORDER — METHOCARBAMOL 1000 MG/10ML IJ SOLN: 500.0000 mg | Freq: Four times a day (QID) | INTRAMUSCULAR | Status: DC | PRN

## 2017-03-18 MED ORDER — ACETAMINOPHEN 650 MG RE SUPP: 650.0000 mg | RECTAL | Status: DC | PRN

## 2017-03-18 MED ORDER — BUPIVACAINE-EPINEPHRINE (PF) 0.5% -1:200000 IJ SOLN
INTRAMUSCULAR | Status: DC | PRN
  Administered 2017-03-18: 20 mL via PERINEURAL

## 2017-03-18 MED ORDER — LOSARTAN POTASSIUM 50 MG PO TABS
50.0000 mg | ORAL_TABLET | Freq: Every day | ORAL | Status: DC
  Administered 2017-03-18: 50 mg via ORAL
  Filled 2017-03-18: qty 1

## 2017-03-18 MED ORDER — GABAPENTIN 300 MG PO CAPS
300.0000 mg | ORAL_CAPSULE | Freq: Once | ORAL | Status: AC
  Administered 2017-03-18: 300 mg via ORAL

## 2017-03-18 MED ORDER — LIDOCAINE HCL (CARDIAC) 20 MG/ML IV SOLN
INTRAVENOUS | Status: DC | PRN
  Administered 2017-03-18: 30 mg via INTRATRACHEAL

## 2017-03-18 MED ORDER — POLYVINYL ALCOHOL 1.4 % OP SOLN: 1.0000 [drp] | Freq: Four times a day (QID) | OPHTHALMIC | Status: DC | PRN

## 2017-03-18 MED ORDER — MIDAZOLAM HCL 2 MG/2ML IJ SOLN
INTRAMUSCULAR | Status: AC
  Administered 2017-03-18: 1 mg via INTRAVENOUS
  Filled 2017-03-18: qty 2

## 2017-03-18 MED ORDER — ROCURONIUM BROMIDE 100 MG/10ML IV SOLN
INTRAVENOUS | Status: DC | PRN
  Administered 2017-03-18: 20 mg via INTRAVENOUS
  Administered 2017-03-18: 50 mg via INTRAVENOUS

## 2017-03-18 MED ORDER — ONDANSETRON HCL 4 MG/2ML IJ SOLN
INTRAMUSCULAR | Status: DC | PRN
  Administered 2017-03-18: 4 mg via INTRAVENOUS

## 2017-03-18 MED ORDER — FAMOTIDINE 20 MG PO TABS
20.0000 mg | ORAL_TABLET | Freq: Every day | ORAL | Status: DC
  Administered 2017-03-18: 20 mg via ORAL
  Filled 2017-03-18: qty 1

## 2017-03-18 MED ORDER — GABAPENTIN 300 MG PO CAPS
ORAL_CAPSULE | ORAL | Status: AC
  Administered 2017-03-18: 300 mg via ORAL
  Filled 2017-03-18: qty 1

## 2017-03-18 MED ORDER — 0.9 % SODIUM CHLORIDE (POUR BTL) OPTIME
TOPICAL | Status: DC | PRN
  Administered 2017-03-18: 1000 mL

## 2017-03-18 MED ORDER — HYDROCODONE-ACETAMINOPHEN 5-325 MG PO TABS
2.0000 | ORAL_TABLET | ORAL | Status: DC | PRN
  Administered 2017-03-18 – 2017-03-19 (×2): 2 via ORAL
  Filled 2017-03-18 (×2): qty 2

## 2017-03-18 MED ORDER — KETOROLAC TROMETHAMINE 15 MG/ML IJ SOLN
INTRAMUSCULAR | Status: AC
  Filled 2017-03-18: qty 1

## 2017-03-18 MED ORDER — ACETAMINOPHEN 325 MG PO TABS: 650.0000 mg | ORAL_TABLET | ORAL | Status: DC | PRN

## 2017-03-18 MED ORDER — PROPOFOL 10 MG/ML IV BOLUS
INTRAVENOUS | Status: AC
  Filled 2017-03-18: qty 20

## 2017-03-18 MED ORDER — POVIDONE-IODINE 10 % EX SWAB: 2.0000 "application " | Freq: Once | CUTANEOUS | Status: DC

## 2017-03-18 MED ORDER — DEXAMETHASONE SODIUM PHOSPHATE 10 MG/ML IJ SOLN
INTRAMUSCULAR | Status: DC | PRN
  Administered 2017-03-18: 10 mg via INTRAVENOUS

## 2017-03-18 MED ORDER — SUGAMMADEX SODIUM 200 MG/2ML IV SOLN
INTRAVENOUS | Status: DC | PRN
  Administered 2017-03-18: 200 mg via INTRAVENOUS

## 2017-03-18 MED ORDER — ACETAMINOPHEN 500 MG PO TABS
ORAL_TABLET | ORAL | Status: AC
  Administered 2017-03-18: 1000 mg via ORAL
  Filled 2017-03-18: qty 2

## 2017-03-18 MED ORDER — ACETAMINOPHEN 500 MG PO TABS
1000.0000 mg | ORAL_TABLET | Freq: Once | ORAL | Status: AC
  Administered 2017-03-18: 1000 mg via ORAL

## 2017-03-18 MED ORDER — PROPOFOL 10 MG/ML IV BOLUS
INTRAVENOUS | Status: DC | PRN
  Administered 2017-03-18: 120 mg via INTRAVENOUS

## 2017-03-18 MED ORDER — BACLOFEN 10 MG PO TABS: 10.0000 mg | ORAL_TABLET | Freq: Three times a day (TID) | ORAL | 0 refills | Status: DC | PRN

## 2017-03-18 MED ORDER — ATORVASTATIN CALCIUM 20 MG PO TABS
10.0000 mg | ORAL_TABLET | Freq: Every evening | ORAL | Status: DC
  Administered 2017-03-18: 10 mg via ORAL
  Filled 2017-03-18: qty 1

## 2017-03-18 MED ORDER — CEFAZOLIN SODIUM-DEXTROSE 2-4 GM/100ML-% IV SOLN
INTRAVENOUS | Status: AC
  Filled 2017-03-18: qty 100

## 2017-03-18 MED ORDER — FENTANYL CITRATE (PF) 250 MCG/5ML IJ SOLN
INTRAMUSCULAR | Status: AC
  Filled 2017-03-18: qty 5

## 2017-03-18 MED ORDER — HYDROCODONE-ACETAMINOPHEN 5-325 MG PO TABS: 1.0000 | ORAL_TABLET | ORAL | Status: DC | PRN

## 2017-03-18 MED ORDER — DIPHENHYDRAMINE HCL 12.5 MG/5ML PO ELIX: 12.5000 mg | ORAL_SOLUTION | ORAL | Status: DC | PRN

## 2017-03-18 MED ORDER — METHOCARBAMOL 500 MG PO TABS: 500.0000 mg | ORAL_TABLET | Freq: Four times a day (QID) | ORAL | Status: DC | PRN

## 2017-03-18 MED ORDER — DOCUSATE SODIUM 100 MG PO CAPS
100.0000 mg | ORAL_CAPSULE | Freq: Two times a day (BID) | ORAL | Status: DC
  Administered 2017-03-18: 100 mg via ORAL
  Filled 2017-03-18: qty 1

## 2017-03-18 MED ORDER — HYDROMORPHONE HCL 1 MG/ML IJ SOLN: 0.5000 mg | INTRAMUSCULAR | Status: DC | PRN

## 2017-03-18 MED ORDER — FENTANYL CITRATE (PF) 100 MCG/2ML IJ SOLN
50.0000 ug | Freq: Once | INTRAMUSCULAR | Status: AC
  Administered 2017-03-18: 50 ug via INTRAVENOUS

## 2017-03-18 MED ORDER — ONDANSETRON HCL 4 MG/2ML IJ SOLN: 4.0000 mg | Freq: Four times a day (QID) | INTRAMUSCULAR | Status: DC | PRN

## 2017-03-18 MED ORDER — FENTANYL CITRATE (PF) 250 MCG/5ML IJ SOLN
INTRAMUSCULAR | Status: DC | PRN
  Administered 2017-03-18: 50 ug via INTRAVENOUS

## 2017-03-18 MED ORDER — LACTATED RINGERS IV SOLN
INTRAVENOUS | Status: DC
  Administered 2017-03-18 (×2): via INTRAVENOUS

## 2017-03-18 MED ORDER — METOCLOPRAMIDE HCL 5 MG PO TABS: 5.0000 mg | ORAL_TABLET | Freq: Three times a day (TID) | ORAL | Status: DC | PRN

## 2017-03-18 MED ORDER — LACTATED RINGERS IV SOLN: INTRAVENOUS | Status: DC

## 2017-03-18 MED ORDER — DOCUSATE SODIUM 100 MG PO CAPS
100.0000 mg | ORAL_CAPSULE | Freq: Two times a day (BID) | ORAL | 0 refills | Status: DC
Start: 2017-03-18 — End: 2017-11-12

## 2017-03-18 MED ORDER — MIDAZOLAM HCL 2 MG/2ML IJ SOLN
1.0000 mg | Freq: Once | INTRAMUSCULAR | Status: AC
  Administered 2017-03-18: 1 mg via INTRAVENOUS

## 2017-03-18 SURGICAL SUPPLY — 60 items
AID PSTN UNV HD RSTRNT DISP (MISCELLANEOUS) ×1
BLADE SAW SAG 29X58X.64 (BLADE) IMPLANT
BLADE SAW SAG 73X25 THK (BLADE) ×2
BLADE SAW SGTL 73X25 THK (BLADE) ×1 IMPLANT
CAPT SHLDR TOTAL 2 ×3 IMPLANT
CEMENT BONE R 1X40 (Cement) ×2 IMPLANT
CHLORAPREP W/TINT 26ML (MISCELLANEOUS) ×3 IMPLANT
CLOSURE WOUND 1/2 X4 (GAUZE/BANDAGES/DRESSINGS) ×1
COVER SURGICAL LIGHT HANDLE (MISCELLANEOUS) ×3 IMPLANT
DRAPE INCISE IOBAN 66X45 STRL (DRAPES) ×3 IMPLANT
DRAPE U-SHAPE 47X51 STRL (DRAPES) ×3 IMPLANT
DRSG ADAPTIC 3X8 NADH LF (GAUZE/BANDAGES/DRESSINGS) ×3 IMPLANT
DRSG MEPILEX BORDER 4X8 (GAUZE/BANDAGES/DRESSINGS) ×2 IMPLANT
DRSG PAD ABDOMINAL 8X10 ST (GAUZE/BANDAGES/DRESSINGS) ×6 IMPLANT
ELECT BLADE 4.0 EZ CLEAN MEGAD (MISCELLANEOUS) ×3
ELECT REM PT RETURN 9FT ADLT (ELECTROSURGICAL) ×3
ELECTRODE BLDE 4.0 EZ CLN MEGD (MISCELLANEOUS) IMPLANT
ELECTRODE REM PT RTRN 9FT ADLT (ELECTROSURGICAL) ×1 IMPLANT
GAUZE SPONGE 4X4 12PLY STRL (GAUZE/BANDAGES/DRESSINGS) ×3 IMPLANT
GLOVE BIO SURGEON STRL SZ7.5 (GLOVE) ×6 IMPLANT
GLOVE BIOGEL PI IND STRL 8 (GLOVE) ×2 IMPLANT
GLOVE BIOGEL PI INDICATOR 8 (GLOVE) ×4
GOWN STRL REUS W/ TWL LRG LVL3 (GOWN DISPOSABLE) ×2 IMPLANT
GOWN STRL REUS W/ TWL XL LVL3 (GOWN DISPOSABLE) ×1 IMPLANT
GOWN STRL REUS W/TWL LRG LVL3 (GOWN DISPOSABLE) ×6
GOWN STRL REUS W/TWL XL LVL3 (GOWN DISPOSABLE) ×3
KIT BASIN OR (CUSTOM PROCEDURE TRAY) ×3 IMPLANT
KIT ROOM TURNOVER OR (KITS) ×3 IMPLANT
MANIFOLD NEPTUNE II (INSTRUMENTS) ×3 IMPLANT
NDL HYPO 25GX1X1/2 BEV (NEEDLE) ×1 IMPLANT
NDL SUT .5 MAYO 1.404X.05X (NEEDLE) ×1 IMPLANT
NEEDLE HYPO 25GX1X1/2 BEV (NEEDLE) ×3 IMPLANT
NEEDLE MAYO TAPER (NEEDLE) ×3
NS IRRIG 1000ML POUR BTL (IV SOLUTION) ×3 IMPLANT
PACK SHOULDER (CUSTOM PROCEDURE TRAY) ×3 IMPLANT
PAD ARMBOARD 7.5X6 YLW CONV (MISCELLANEOUS) ×6 IMPLANT
PIN HUMERAL STMN 3.2MMX9IN (INSTRUMENTS) IMPLANT
RESTRAINT HEAD UNIVERSAL NS (MISCELLANEOUS) ×3 IMPLANT
SHOULDER CAPITATED TOTAL 2 IMPLANT
SLING ARM IMMOBILIZER LRG (SOFTGOODS) ×3 IMPLANT
SLING ARM IMMOBILIZER MED (SOFTGOODS) ×2 IMPLANT
SMARTMIX MINI TOWER (MISCELLANEOUS) ×3
SPONGE LAP 18X18 X RAY DECT (DISPOSABLE) ×3 IMPLANT
STRIP CLOSURE SKIN 1/2X4 (GAUZE/BANDAGES/DRESSINGS) ×2 IMPLANT
SUCTION FRAZIER HANDLE 10FR (MISCELLANEOUS) ×2
SUCTION TUBE FRAZIER 10FR DISP (MISCELLANEOUS) ×1 IMPLANT
SUPPORT WRAP ARM LG (MISCELLANEOUS) IMPLANT
SUT FIBERWIRE #2 38 T-5 BLUE (SUTURE) ×15
SUT MNCRL AB 4-0 PS2 18 (SUTURE) ×3 IMPLANT
SUT MON AB 2-0 CT1 36 (SUTURE) ×3 IMPLANT
SUT VIC AB 0 CT1 27 (SUTURE) ×3
SUT VIC AB 0 CT1 27XBRD ANBCTR (SUTURE) ×1 IMPLANT
SUTURE FIBERWR #2 38 T-5 BLUE (SUTURE) ×4 IMPLANT
TAPE STRIPS DRAPE STRL (GAUZE/BANDAGES/DRESSINGS) ×2 IMPLANT
TOWEL OR 17X24 6PK STRL BLUE (TOWEL DISPOSABLE) ×3 IMPLANT
TOWEL OR 17X26 10 PK STRL BLUE (TOWEL DISPOSABLE) ×3 IMPLANT
TOWER CARTRIDGE SMART MIX (DISPOSABLE) IMPLANT
TOWER SMARTMIX MINI (MISCELLANEOUS) IMPLANT
TRAY FOLEY CATH SILVER 14FR (SET/KITS/TRAYS/PACK) IMPLANT
WATER STERILE IRR 1000ML POUR (IV SOLUTION) ×1 IMPLANT

## 2017-03-18 NOTE — Plan of Care (Signed)
  Progressing Pain Management: Pain level will decrease with appropriate interventions 03/18/2017 2046 - Progressing by Charlena Cross, RN

## 2017-03-18 NOTE — Transfer of Care (Signed)
Immediate Anesthesia Transfer of Care Note  Patient: Ashley Cooke  Procedure(s) Performed: TOTAL SHOULDER ARTHROPLASTY (Left Shoulder)  Patient Location: PACU  Anesthesia Type:GA combined with regional for post-op pain  Level of Consciousness: awake, alert  and oriented  Airway & Oxygen Therapy: Patient Spontanous Breathing and Patient connected to nasal cannula oxygen  Post-op Assessment: Report given to RN and Post -op Vital signs reviewed and stable  Post vital signs: Reviewed and stable  Last Vitals:  Vitals:   03/18/17 0813 03/18/17 0905  BP: (!) 164/80 (!) 167/75  Pulse: 80 72  Resp: 20 (!) 8  Temp: 36.5 C   SpO2: 99% 99%    Last Pain:  Vitals:   03/18/17 0813  TempSrc: Oral  PainSc: 4          Complications: No apparent anesthesia complications

## 2017-03-18 NOTE — Anesthesia Procedure Notes (Signed)
Procedure Name: Intubation Date/Time: 03/18/2017 10:13 AM Performed by: Mariea Clonts, CRNA Pre-anesthesia Checklist: Patient identified, Emergency Drugs available, Suction available and Patient being monitored Patient Re-evaluated:Patient Re-evaluated prior to induction Oxygen Delivery Method: Circle System Utilized Preoxygenation: Pre-oxygenation with 100% oxygen Induction Type: IV induction Ventilation: Mask ventilation without difficulty Laryngoscope Size: Miller and 2 Grade View: Grade I Tube type: Oral Tube size: 7.0 mm Number of attempts: 1 Airway Equipment and Method: Stylet and Oral airway Placement Confirmation: ETT inserted through vocal cords under direct vision,  positive ETCO2 and breath sounds checked- equal and bilateral Tube secured with: Tape Dental Injury: Teeth and Oropharynx as per pre-operative assessment

## 2017-03-18 NOTE — Anesthesia Postprocedure Evaluation (Signed)
Anesthesia Post Note  Patient: Lowell Bouton  Procedure(s) Performed: TOTAL SHOULDER ARTHROPLASTY (Left Shoulder)     Patient location during evaluation: PACU Anesthesia Type: General and Regional Level of consciousness: awake and alert Pain management: pain level controlled Vital Signs Assessment: post-procedure vital signs reviewed and stable Respiratory status: spontaneous breathing, nonlabored ventilation, respiratory function stable and patient connected to nasal cannula oxygen Cardiovascular status: blood pressure returned to baseline and stable Postop Assessment: no apparent nausea or vomiting Anesthetic complications: no    Last Vitals:  Vitals:   03/18/17 1400 03/18/17 1431  BP: (!) 146/81 (!) 158/89  Pulse: 86 80  Resp: 12 16  Temp: (!) 36.4 C (!) 36.3 C  SpO2: 95% 95%    Last Pain:  Vitals:   03/18/17 1400  TempSrc:   PainSc: 0-No pain                 Effie Berkshire

## 2017-03-18 NOTE — Anesthesia Preprocedure Evaluation (Addendum)
Anesthesia Evaluation  Patient identified by MRN, date of birth, ID band Patient awake    Reviewed: Allergy & Precautions, NPO status , Patient's Chart, lab work & pertinent test results  Airway Mallampati: II  TM Distance: >3 FB Neck ROM: Full    Dental  (+) Teeth Intact, Dental Advisory Given   Pulmonary former smoker,    breath sounds clear to auscultation       Cardiovascular hypertension, Pt. on medications  Rhythm:Regular Rate:Normal     Neuro/Psych negative neurological ROS  negative psych ROS   GI/Hepatic Neg liver ROS, GERD  Medicated,  Endo/Other  negative endocrine ROS  Renal/GU negative Renal ROS     Musculoskeletal  (+) Arthritis ,   Abdominal Normal abdominal exam  (+)   Peds  Hematology   Anesthesia Other Findings - HLD  Reproductive/Obstetrics                            Lab Results  Component Value Date   WBC 6.5 03/07/2017   HGB 14.5 03/07/2017   HCT 43.4 03/07/2017   MCV 95.6 03/07/2017   PLT 308 03/07/2017   Lab Results  Component Value Date   CREATININE 1.03 (H) 03/07/2017   BUN 22 (H) 03/07/2017   NA 139 03/07/2017   K 4.6 03/07/2017   CL 104 03/07/2017   CO2 24 03/07/2017   Lab Results  Component Value Date   INR 1.13 01/31/2015   INR 1.10 10/03/2014   EKG: normal sinus rhythm.   Anesthesia Physical Anesthesia Plan  ASA: II  Anesthesia Plan: General   Post-op Pain Management: GA combined w/ Regional for post-op pain   Induction: Intravenous  PONV Risk Score and Plan: 4 or greater and Ondansetron, Dexamethasone and Treatment may vary due to age or medical condition  Airway Management Planned: Oral ETT  Additional Equipment: None  Intra-op Plan:   Post-operative Plan: Extubation in OR  Informed Consent: I have reviewed the patients History and Physical, chart, labs and discussed the procedure including the risks, benefits and  alternatives for the proposed anesthesia with the patient or authorized representative who has indicated his/her understanding and acceptance.   Dental advisory given  Plan Discussed with: CRNA  Anesthesia Plan Comments:        Anesthesia Quick Evaluation

## 2017-03-18 NOTE — Interval H&P Note (Signed)
History and Physical Interval Note:  03/18/2017 9:08 AM  Ashley Cooke  has presented today for surgery, with the diagnosis of OA LEFT SHOULDER  The various methods of treatment have been discussed with the patient and family. After consideration of risks, benefits and other options for treatment, the patient has consented to  Procedure(s): TOTAL SHOULDER ARTHROPLASTY (Left) as a surgical intervention .  The patient's history has been reviewed, patient examined, no change in status, stable for surgery.  I have reviewed the patient's chart and labs.  Questions were answered to the patient's satisfaction.     Ashley Cooke

## 2017-03-18 NOTE — Op Note (Signed)
03/18/2017  1:20 PM  PATIENT:  Ashley Cooke    PRE-OPERATIVE DIAGNOSIS:  OA LEFT SHOULDER  POST-OPERATIVE DIAGNOSIS:  Same  PROCEDURE:  TOTAL SHOULDER ARTHROPLASTY  SURGEON:  Anwar Sakata, Ernesta Amble, MD  PHYSICIAN ASSISTANT: Roxan Hockey, PA-C, he was present and scrubbed throughout the case, critical for completion in a timely fashion, and for retraction, instrumentation, and closure.   ANESTHESIA:   General  PREOPERATIVE INDICATIONS:  Ashley Cooke is a  77 y.o. female with a diagnosis of OA LEFT SHOULDER who failed conservative measures and elected for surgical management.    The risks benefits and alternatives were discussed with the patient preoperatively including but not limited to the risks of infection, bleeding, nerve injury, cardiopulmonary complications, the need for revision surgery, dislocation, loosening, incomplete relief of pain, among others, and the patient was willing to proceed.   OPERATIVE IMPLANTS: biomet size 10 press-fit humeral stem, with a small cemented glenoid polyethylene 4 peg implant.   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   OPERATIVE PROCEDURE: The patient was brought to the operating room and placed in the supine position. General anesthesia was administered. IV antibiotics were given.  The upper extremity was prepped and draped in usual sterile fashion. The patient was in a beachchair position with all bony prominences padded.   Time out was performed and a deltopectoral approach was carried out. The biceps tendon was tenodesed to the pectoralis tendon. The subscapularis was released, tagging it with a #2 MaxBraid, leaving a cuff of tendon for repair.   The inferior osteophyte was removed, and release of the capsule off of the humeral side was completed. The head was dislocated, and I reamed sequentially. I placed the humeral cutting guide at 30  of retroversion, and then pinned this into place, and made my humeral neck cut. This was at the appropriate level.   I then placed deep retractors and exposed the glenoid. I excised the labrum circumferentially, taking care to protect the axillary nerve inferiorly.   I then placed a guidewire into the center position, controlling appropriate version and inclination. I then reamed over the guidewire with the small reamer, and was satisfied with the preparation. I preserved the subchondral bone in order to maximize the strength and minimize the risk for subsequent subsidence.   I then drilled the central hole for the regenerex peg, and then placed the guide, and then drilled the 3 peripheral peg holes. I had excellent bony circumferential contact.   I then cleaned the glenoid, irrigated it copiously, and then dried it and cemented the prosthesis into place. Excellent seating was achieved. I had full exposure. The cement cured, and then I turned my attention to the humeral side.   I sequentially broached, up to the selected size, with the broach set at 30 of retroversion. I then placed the real stem. I trialed with multiple heads, and the above-named component was selected. Increased posterior coverage improved the coverage. The soft tissue tension was appropriate.   I then impacted the real humeral head into place, reduced the head, and irrigated copiously. Excellent stability and range of motion was achieved. I repaired the subscapularis with 4 #2 fiberwire as well as the rotator interval, and irrigated copiously once more. The subcutaneous tissue was closed with Vicryl including the deltopectoral fascia.   The skin was closed with Steri-Strips and sterile gauze was applied. She had a preoperative nerve block. She tolerated the  procedure well and there were no complicatiions

## 2017-03-18 NOTE — Anesthesia Procedure Notes (Signed)
Anesthesia Regional Block: Interscalene brachial plexus block   Pre-Anesthetic Checklist: ,, timeout performed, Correct Patient, Correct Site, Correct Laterality, Correct Procedure, Correct Position, site marked, Risks and benefits discussed,  Surgical consent,  Pre-op evaluation,  At surgeon's request and post-op pain management  Laterality: Left  Prep: chloraprep       Needles:  Injection technique: Single-shot  Needle Type: Echogenic Needle     Needle Length: 9cm  Needle Gauge: 21     Additional Needles:   Procedures:,,,, ultrasound used (permanent image in chart),,,,  Narrative:  Start time: 03/18/2017 9:00 AM End time: 03/18/2017 9:10 AM Injection made incrementally with aspirations every 5 mL.  Performed by: Personally  Anesthesiologist: Effie Berkshire, MD  Additional Notes: Patient tolerated the procedure well. Local anesthetic introduced in an incremental fashion under minimal resistance after negative aspirations. No paresthesias were elicited. After completion of the procedure, no acute issues were identified and patient continued to be monitored by RN.

## 2017-03-18 NOTE — Discharge Instructions (Signed)
Keep sling on at all times.  Do not bear weight with arm. Keep your dressing on and dry until follow up. Take pain medicine as needed with the goal of transitioning to over the counter medicines.  Your pain medication has Tylenol in it.  Do not take Tylenol and Norco at the same time.  INSTRUCTIONS AFTER JOINT REPLACEMENT   o Remove items at home which could result in a fall. This includes throw rugs or furniture in walking pathways o ICE to the affected joint every three hours while awake for 30 minutes at a time, for at least the first 3-5 days, and then as needed for pain and swelling.  Continue to use ice for pain and swelling. You may notice swelling that will progress down to the foot and ankle.  This is normal after surgery.  Elevate your leg when you are not up walking on it.   o Continue to use the breathing machine you got in the hospital (incentive spirometer) which will help keep your temperature down.  It is common for your temperature to cycle up and down following surgery, especially at night when you are not up moving around and exerting yourself.  The breathing machine keeps your lungs expanded and your temperature down.   DIET:  As you were doing prior to hospitalization, we recommend a well-balanced diet.  DRESSING / WOUND CARE / SHOWERING  Keep dressing dry.  You may use an occlusive plastic wrap (Press'n Seal for example) with blue painter's tape at edges, NO SOAKING/SUBMERGING IN THE BATHTUB.  If the bandage gets wet, change with a clean dry gauze.  If the incision gets wet, pat the wound dry with a clean towel.  ACTIVITY  o Increase activity slowly as tolerated, but follow the weight bearing instructions below.   o No driving for 6 weeks or until further direction given by your physician.  You cannot drive while taking narcotics.  o No lifting or carrying greater than 10 lbs. until further directed by your surgeon. o Avoid periods of inactivity such as sitting longer  than an hour when not asleep. This helps prevent blood clots.  o You may return to work once you are authorized by your doctor.     WEIGHT BEARING  Do not bear weight with arm.  Maintain sling at all times.  A rehabilitation program following joint replacement surgery can speed recovery and prevent re-injury in the future due to weakened muscles. Contact your doctor or a physical therapist for more information on knee rehabilitation.    CONSTIPATION  Constipation is defined medically as fewer than three stools per week and severe constipation as less than one stool per week.  Even if you have a regular bowel pattern at home, your normal regimen is likely to be disrupted due to multiple reasons following surgery.  Combination of anesthesia, postoperative narcotics, change in appetite and fluid intake all can affect your bowels.   YOU MUST use at least one of the following options; they are listed in order of increasing strength to get the job done.  They are all available over the counter, and you may need to use some, POSSIBLY even all of these options:    Drink plenty of fluids (prune juice may be helpful) and high fiber foods Colace 100 mg by mouth twice a day  Senokot for constipation as directed and as needed Dulcolax (bisacodyl), take with full glass of water  Miralax (polyethylene glycol) once or twice a day  as needed.  If you have tried all these things and are unable to have a bowel movement in the first 3-4 days after surgery call either your surgeon or your primary doctor.    If you experience loose stools or diarrhea, hold the medications until you stool forms back up.  If your symptoms do not get better within 1 week or if they get worse, check with your doctor.  If you experience "the worst abdominal pain ever" or develop nausea or vomiting, please contact the office immediately for further recommendations for treatment.   ITCHING:  If you experience itching with your  medications, try taking only a single pain pill, or even half a pain pill at a time.  You can also use Benadryl over the counter for itching or also to help with sleep.   TED HOSE STOCKINGS:  Use stockings on both legs until for at least 2 weeks or as directed by physician office. They may be removed at night for sleeping.  MEDICATIONS:  See your medication summary on the After Visit Summary that nursing will review with you.  You may have some home medications which will be placed on hold until you complete the course of blood thinner medication.  It is important for you to complete the blood thinner medication as prescribed.  PRECAUTIONS:  If you experience chest pain or shortness of breath - call 911 immediately for transfer to the hospital emergency department.   If you develop a fever greater that 101 F, purulent drainage from wound, increased redness or drainage from wound, foul odor from the wound/dressing, or calf pain - CONTACT YOUR SURGEON.                                                   FOLLOW-UP APPOINTMENTS:  If you do not already have a post-op appointment, please call the office for an appointment to be seen by your surgeon.  Guidelines for how soon to be seen are listed in your After Visit Summary, but are typically between 1-4 weeks after surgery.  OTHER INSTRUCTIONS:     MAKE SURE YOU:   Understand these instructions.   Get help right away if you are not doing well or get worse.    Thank you for letting us be a part of your medical care team.  It is a privilege we respect greatly.  We hope these instructions will help you stay on track for a fast and full recovery!

## 2017-03-19 ENCOUNTER — Encounter (HOSPITAL_COMMUNITY): Payer: Self-pay | Admitting: Orthopedic Surgery

## 2017-03-19 DIAGNOSIS — Z85820 Personal history of malignant melanoma of skin: Secondary | ICD-10-CM | POA: Diagnosis not present

## 2017-03-19 DIAGNOSIS — Z96653 Presence of artificial knee joint, bilateral: Secondary | ICD-10-CM | POA: Diagnosis present

## 2017-03-19 DIAGNOSIS — Z79899 Other long term (current) drug therapy: Secondary | ICD-10-CM | POA: Diagnosis not present

## 2017-03-19 DIAGNOSIS — E7800 Pure hypercholesterolemia, unspecified: Secondary | ICD-10-CM | POA: Diagnosis present

## 2017-03-19 DIAGNOSIS — Z96611 Presence of right artificial shoulder joint: Secondary | ICD-10-CM | POA: Diagnosis present

## 2017-03-19 DIAGNOSIS — K219 Gastro-esophageal reflux disease without esophagitis: Secondary | ICD-10-CM | POA: Diagnosis present

## 2017-03-19 DIAGNOSIS — M19012 Primary osteoarthritis, left shoulder: Secondary | ICD-10-CM | POA: Diagnosis present

## 2017-03-19 DIAGNOSIS — J302 Other seasonal allergic rhinitis: Secondary | ICD-10-CM | POA: Diagnosis present

## 2017-03-19 DIAGNOSIS — Z8249 Family history of ischemic heart disease and other diseases of the circulatory system: Secondary | ICD-10-CM | POA: Diagnosis not present

## 2017-03-19 DIAGNOSIS — Z7982 Long term (current) use of aspirin: Secondary | ICD-10-CM | POA: Diagnosis not present

## 2017-03-19 DIAGNOSIS — I10 Essential (primary) hypertension: Secondary | ICD-10-CM | POA: Diagnosis present

## 2017-03-19 DIAGNOSIS — Z87891 Personal history of nicotine dependence: Secondary | ICD-10-CM | POA: Diagnosis not present

## 2017-03-19 NOTE — Progress Notes (Signed)
Pt doing well. Pt given D/C instructions with Rx's, verbal understanding was provided. Pt's incision is clean and dry with no sign of infection. Pt's IV was removed prior to D/C. Pt D/C'd home via wheelchair @ 0915 per MD order. Pt is stable @ D/C and has no other needs at this time. Holli Humbles, RN

## 2017-03-19 NOTE — Discharge Summary (Signed)
Discharge Summary  Patient ID: PRUDIE GUTHRIDGE MRN: [id number] DOB/AGE: 08-21-1940 77 y.o.  Admit date: 03/18/2017 Discharge date: 03/19/2017  Admission Diagnoses:  Primary osteoarthritis, left shoulder  Discharge Diagnoses:  Principal Problem:   Primary osteoarthritis, left shoulder Active Problems:   Pure hypercholesterolemia   Essential hypertension   Osteoarthritis of left shoulder   Past Medical History:  Diagnosis Date  . Arthritis    "primarily in my knee & thumbs" (10/15/2014)  . Hypercholesterolemia   . Hypertension   . Melanoma of lower leg ([place]) 05/2003   "left"  . Seasonal allergies   . Squamous carcinoma    "all on the left side:  under eye, nostril, bicept"    Surgeries: Procedure(s): TOTAL SHOULDER ARTHROPLASTY on 03/18/2017   Consultants (if any):   Discharged Condition: Improved  Hospital Course: JODEL MAYHALL is an 77 y.o. female who was admitted 03/18/2017 with a diagnosis of Primary osteoarthritis, left shoulder and went to the operating room on 03/18/2017 and underwent the above named procedures.    She was given perioperative antibiotics:  Anti-infectives (From admission, onward)   Start     Dose/Rate Route Frequency Ordered Stop   03/18/17 1600  ceFAZolin (ANCEF) IVPB 1 g/50 mL premix     1 g 100 mL/hr over 30 Minutes Intravenous Every 6 hours 03/18/17 1426 03/19/17 0510   03/18/17 0800  ceFAZolin (ANCEF) IVPB 2g/100 mL premix     2 g 200 mL/hr over 30 Minutes Intravenous To [facility] 03/18/17 0753 03/18/17 1015   03/18/17 0751  ceFAZolin (ANCEF) 2-4 GM/100ML-% IVPB    Comments:  Rosenberger, Meredit: cabinet override      03/18/17 0751 03/18/17 1015    .  She was given sequential compression devices, early ambulation, ted hose, and Aspirin for DVT prophylaxis.  She benefited maximally from the hospital stay and there were no complications.    Recent vital signs:  Vitals:   03/18/17 2351 03/19/17 0430  BP: 131/63 126/64  Pulse: 78 73   Resp: 18 20  Temp: 98.2 F (36.8 C) 98.4 F (36.9 C)  SpO2: 100% 100%    Recent laboratory studies:  Lab Results  Component Value Date   HGB 14.5 03/07/2017   HGB 14.0 08/02/2016   HGB 10.6 (L) 02/12/2015   Lab Results  Component Value Date   WBC 6.5 03/07/2017   PLT 308 03/07/2017   Lab Results  Component Value Date   INR 1.13 01/31/2015   Lab Results  Component Value Date   NA 139 03/07/2017   K 4.6 03/07/2017   CL 104 03/07/2017   CO2 24 03/07/2017   BUN 22 (H) 03/07/2017   CREATININE 1.03 (H) 03/07/2017   GLUCOSE 93 03/07/2017    Discharge Medications:   Allergies as of 03/19/2017   No Known Allergies     Medication List    TAKE these medications   acetaminophen 500 MG tablet Commonly known as:  TYLENOL Take 1,000 mg by mouth every 6 (six) hours as needed.   ALEVE 220 MG Caps Generic drug:  Naproxen Sodium Take 220 mg by mouth 2 (two) times daily.   ARTIFICIAL TEARS OP Apply 1 drop to eye 4 (four) times daily as needed (dry eyes).   aspirin EC 81 MG tablet Take 81 mg by mouth daily.   atorvastatin 10 MG tablet Commonly known as:  LIPITOR Take 10 mg by mouth every evening.   baclofen 10 MG tablet Commonly known as:  LIORESAL Take  1 tablet (10 mg total) by mouth 3 (three) times daily as needed for muscle spasms.   CALCIUM 600+D PO Take 1 tablet by mouth daily.   cholecalciferol 1000 units tablet Commonly known as:  VITAMIN D Take 1,000 Units by mouth daily.   docusate sodium 100 MG capsule Commonly known as:  COLACE Take 1 capsule (100 mg total) by mouth 2 (two) times daily. To prevent constipation while taking pain medication.   famotidine 20 MG tablet Commonly known as:  PEPCID Take 20 mg by mouth daily.   HYDROcodone-acetaminophen 5-325 MG tablet Commonly known as:  NORCO Take 1-2 tablets by mouth every 4 (four) hours as needed for moderate pain.   ketotifen 0.025 % ophthalmic solution Commonly known as:  ZADITOR Place 1 drop  into both eyes 2 (two) times daily as needed (allergies).   loratadine 10 MG tablet Commonly known as:  CLARITIN Take 10 mg by mouth daily as needed (seasonal allergies).   losartan 100 MG tablet Commonly known as:  COZAAR Take 50 mg by mouth daily.   multivitamin with minerals Tabs tablet Take 1 tablet by mouth daily.   ondansetron 4 MG tablet Commonly known as:  ZOFRAN Take 1 tablet (4 mg total) by mouth every 8 (eight) hours as needed for nausea or vomiting.   vitamin C 500 MG tablet Commonly known as:  ASCORBIC ACID Take 500 mg by mouth daily.       Diagnostic Studies: Ct Shoulder Left Wo Contrast  Result Date: 03/06/2017 CLINICAL DATA:  Left shoulder pain for several months. No known injury. History of prior right shoulder replacement. EXAM: CT OF THE UPPER LEFT EXTREMITY WITHOUT CONTRAST TECHNIQUE: Multidetector CT imaging of the upper left extremity was performed according to the standard protocol. COMPARISON:  None. FINDINGS: Bones/Joint/Cartilage No acute abnormality is identified. The patient has advanced glenohumeral degenerative disease. There is bone-on-bone joint space narrowing and osteophytosis off the humeral head and anterior lip of the glenoid. Several tiny subchondral cysts are identified about the joint. Largest cyst is in the inferior glenoid and measures 0.5 cm in diameter. Glenohumeral joint effusion is identified. No focal bony lesion. 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 There mild to moderate acromioclavicular osteoarthritis. Ligaments Suboptimally assessed by CT. Muscles and Tendons The rotator cuff appears intact. Musculature of the shoulder girdle is preserved. Soft tissues Imaged lung parenchyma is clear.  Aortic atherosclerosis noted. IMPRESSION: Advanced glenohumeral osteoarthritis. Mild to moderate acromioclavicular osteoarthritis. Os acromiale noted. Rotator cuff appears intact. Atherosclerosis. Electronically Signed   By: Inge Rise M.D.   On: 03/06/2017  13:40   Dg Shoulder Left Port  Result Date: 03/18/2017 CLINICAL DATA:  Status post left shoulder replacement EXAM: LEFT SHOULDER - 1 VIEW COMPARISON:  03/06/2017 CT FINDINGS: Status post left shoulder replacement with normal alignment. Minimal degenerative change of the [facility] joint. Left lung apex is clear IMPRESSION: Status post left shoulder replacement. No acute osseous abnormality. Electronically Signed   By: Donavan Foil M.D.   On: 03/18/2017 19:10    Disposition: 01-Home or Self Care  Discharge Instructions    Discharge patient   Complete by:  As directed    Discharge disposition:  01-Home or Self Care   Discharge patient date:  03/19/2017      Follow-up Information    Renette Butters, MD Follow up.   Specialty:  Orthopedic Surgery Contact information: [address] [place] [id number] [phone]  Signed: Prudencio Burly III PA-C 03/19/2017, 7:47 AM

## 2017-03-19 NOTE — Progress Notes (Signed)
    Subjective: Patient reports pain as mild.  Tolerating diet.  Urinating. No CP, SOB.  OOB mobilizing well.  Objective:   VITALS:   Vitals:   03/18/17 1554 03/18/17 2000 03/18/17 2351 03/19/17 0430  BP: 136/89 (!) 144/68 131/63 126/64  Pulse: (!) 104 84 78 73  Resp: 20 18 18 20   Temp: 97.7 F (36.5 C) 98.1 F (36.7 C) 98.2 F (36.8 C) 98.4 F (36.9 C)  TempSrc:  Oral Oral Oral  SpO2: 95% 97% 100% 100%   CBC Latest Ref Rng & Units 03/07/2017 08/02/2016 02/12/2015  WBC 4.0 - 10.5 K/uL 6.5 6.9 9.3  Hemoglobin 12.0 - 15.0 g/dL 14.5 14.0 10.6(L)  Hematocrit 36.0 - 46.0 % 43.4 41.5 31.3(L)  Platelets 150 - 400 K/uL 308 305 199   BMP Latest Ref Rng & Units 03/07/2017 08/02/2016 02/11/2015  Glucose 65 - 99 mg/dL 93 70 110(H)  BUN 6 - 20 mg/dL 22(H) 25(H) 16  Creatinine 0.44 - 1.00 mg/dL 1.03(H) 0.99 0.92  Sodium 135 - 145 mmol/L 139 137 136  Potassium 3.5 - 5.1 mmol/L 4.6 4.0 4.0  Chloride 101 - 111 mmol/L 104 104 104  CO2 22 - 32 mmol/L 24 24 27   Calcium 8.9 - 10.3 mg/dL 9.7 9.4 8.7(L)   Intake/Output      02/26 0701 - 02/27 0700 02/27 0701 - 02/28 0700   P.O. 600    I.V. 1325    IV Piggyback 50    Total Intake 1975    Urine 0    Blood 100    Total Output 100    Net +1875         Urine Occurrence 5 x       Physical Exam: General: NAD.  Upright in the chair eating breakfast on arrival.  Calm, conversant.   MSK LUE in sling Neurovascularly intact Sensation intact distally Hand warm Incision: dressing C/D/I   Assessment: 1 Day Post-Op  S/P Procedure(s) (LRB): TOTAL SHOULDER ARTHROPLASTY (Left) by Dr. Ernesta Amble. Percell Miller on 03/18/2017  Principal Problem:   Primary osteoarthritis, left shoulder Active Problems:   Pure hypercholesterolemia   Essential hypertension   Osteoarthritis of left shoulder   Primary osteoarthritis left shoulder, status post left TSA Doing well postop day 1. Eating, drinking, voiding, and mobilizing well. Pain well  controlled. Desires discharge to home.  Plan:  Incentive Spirometry Maintain sling Apply ice as needed  Weight Bearing: Non Weight Bearing (NWB) LUE Dressings: Maintain Mepilex.  VTE prophylaxis: Aspirin, SCDs, ambulation Dispo: Home today in the A.M.   Prudencio Burly III, PA-C 03/19/2017, 7:43 AM

## 2017-05-21 HISTORY — PX: OTHER SURGICAL HISTORY: SHX169

## 2017-09-24 ENCOUNTER — Other Ambulatory Visit: Payer: Self-pay | Admitting: Certified Nurse Midwife

## 2017-09-24 ENCOUNTER — Telehealth: Payer: Self-pay | Admitting: Certified Nurse Midwife

## 2017-09-24 DIAGNOSIS — Z1231 Encounter for screening mammogram for malignant neoplasm of breast: Secondary | ICD-10-CM

## 2017-09-24 NOTE — Telephone Encounter (Signed)
Patient called with questions for the nurse about scheduling her upcoming mammogram.

## 2017-09-24 NOTE — Telephone Encounter (Signed)
Return call to patient. Questions regarding the need for 3D mammogram. Has not ever had a 3D mammogram. Discussed benefits of 3D and patient breast density (B). Patient will call to see if 3D is covered by her insurance and will return call if any further questions.  Encounter closed.

## 2017-10-28 ENCOUNTER — Ambulatory Visit
Admission: RE | Admit: 2017-10-28 | Discharge: 2017-10-28 | Disposition: A | Discharge status: Home / Self Care | Payer: Medicare Other | Source: Ambulatory Visit | Attending: Certified Nurse Midwife | Admitting: Certified Nurse Midwife

## 2017-10-28 DIAGNOSIS — Z1231 Encounter for screening mammogram for malignant neoplasm of breast: Secondary | ICD-10-CM

## 2017-11-12 ENCOUNTER — Ambulatory Visit (INDEPENDENT_AMBULATORY_CARE_PROVIDER_SITE_OTHER): Payer: Medicare Other | Admitting: Certified Nurse Midwife

## 2017-11-12 ENCOUNTER — Other Ambulatory Visit: Payer: Self-pay

## 2017-11-12 ENCOUNTER — Encounter: Payer: Self-pay | Admitting: Certified Nurse Midwife

## 2017-11-12 VITALS — BP 130/72 | HR 62 | Resp 16 | Ht 62.5 in | Wt 162.2 lb

## 2017-11-12 DIAGNOSIS — K640 First degree hemorrhoids: Secondary | ICD-10-CM

## 2017-11-12 DIAGNOSIS — Z01411 Encounter for gynecological examination (general) (routine) with abnormal findings: Secondary | ICD-10-CM

## 2017-11-12 DIAGNOSIS — N951 Menopausal and female climacteric states: Secondary | ICD-10-CM | POA: Diagnosis not present

## 2017-11-12 DIAGNOSIS — Z01419 Encounter for gynecological examination (general) (routine) without abnormal findings: Secondary | ICD-10-CM | POA: Diagnosis not present

## 2017-11-12 NOTE — Patient Instructions (Signed)
Colace stool softener daily as needed. Apply Balmex (from baby area in store) to area of hemorrhoid on right. Olive oil daily for vaginal dryness at entrance to vagina. Enjoy your year

## 2017-11-12 NOTE — Progress Notes (Signed)
77 y.o. [id number] Married  Caucasian Fe here for annual exam. Good year with right shoulder repair and cataract removal, all went well! Sees Dr Osborne Casco yearly for labs, aex, Hypertension and cholesterol management, Vitamin D and BMD management. All stable per patient. Has noted ? Hemorrhoid on right side of anal opening, noted light tinge of blood x 2 , no pain or very hard stools. Continues with dermatology twice yearly due to skin changes. No other health concerns today.  Patient's last menstrual period was 01/21/1977 (approximate).          Sexually active: Yes.    The current method of family planning is post menopausal status.    Exercising: Yes.    Gym/ health club routine includes swimming and walking on track . Smoker:  no  Review of Systems  All other systems reviewed and are negative.   Health Maintenance: Pap:  10-12-14 neg, 10-30-16 neg History of Abnormal Pap: no MMG:  10-28-17 category b density birads 1:neg Self Breast exams: yes Colonoscopy:  2017 normal BMD:   2016 dr Osborne Casco normal TDaP:  2010 Shingles: 2007 Pneumonia: 2015 Hep C and HIV: declines Labs: PCP   reports that she quit smoking about 38 years ago. Her smoking use included cigarettes. She has a 11.00 pack-year smoking history. She has never used smokeless tobacco. She reports that she drinks about 7.0 standard drinks of alcohol per week. She reports that she does not use drugs.  Past Medical History:  Diagnosis Date  . Arthritis    "primarily in my knee & thumbs" (10/15/2014)  . Hypercholesterolemia   . Hypertension   . Melanoma of lower leg ([place]) 05/2003   "left"  . Seasonal allergies   . Squamous carcinoma    "all on the left side:  under eye, nostril, bicept"    Past Surgical History:  Procedure Laterality Date  . COLONOSCOPY    . DILATION AND CURETTAGE OF UTERUS  1968  . JOINT REPLACEMENT Bilateral    knee-    . KNEE ARTHROSCOPY Left 05/06/2012   torn miniscus  . MELANOMA EXCISION Left 05/2003    leg  . MOHS SURGERY Left 2000's   "squamous; under eye and nostril"  . SQUAMOUS CELL CARCINOMA EXCISION Left 2008   upper bicept  . TONSILLECTOMY AND ADENOIDECTOMY  1949  . TOTAL KNEE ARTHROPLASTY Left 10/14/2014  . TOTAL KNEE ARTHROPLASTY Left 10/14/2014   Procedure: TOTAL KNEE ARTHROPLASTY;  Surgeon: Earlie Server, MD;  Location: [facility];  Service: Orthopedics;  Laterality: Left;  . TOTAL KNEE ARTHROPLASTY Right 02/10/2015   Procedure: RIGHT TOTAL KNEE ARTHROPLASTY;  Surgeon: Earlie Server, MD;  Location: [place];  Service: Orthopedics;  Laterality: Right;  . TOTAL SHOULDER ARTHROPLASTY Right 08/13/2016   Procedure: TOTAL SHOULDER ARTHROPLASTY;  Surgeon: Renette Butters, MD;  Location: [facility];  Service: Orthopedics;  Laterality: Right;  . TOTAL SHOULDER ARTHROPLASTY Left 03/18/2017   Procedure: TOTAL SHOULDER ARTHROPLASTY;  Surgeon: Renette Butters, MD;  Location: [facility];  Service: Orthopedics;  Laterality: Left;    Current Outpatient Medications  Medication Sig Dispense Refill  . acetaminophen (TYLENOL) 500 MG tablet Take 1,000 mg by mouth every 6 (six) hours as needed.    Marland Kitchen aspirin EC 81 MG tablet Take 81 mg by mouth daily.    Marland Kitchen atorvastatin (LIPITOR) 10 MG tablet Take 10 mg by mouth every evening.     . baclofen (LIORESAL) 10 MG tablet Take 1 tablet (10 mg total) by mouth 3 (three)  times daily as needed for muscle spasms. 40 each 0  . Calcium Carbonate-Vitamin D (CALCIUM 600+D PO) Take 1 tablet by mouth daily.    . cholecalciferol (VITAMIN D) 1000 UNITS tablet Take 1,000 Units by mouth daily.     Marland Kitchen docusate sodium (COLACE) 100 MG capsule Take 1 capsule (100 mg total) by mouth 2 (two) times daily. To prevent constipation while taking pain medication. 60 capsule 0  . famotidine (PEPCID) 20 MG tablet Take 20 mg by mouth daily.     Marland Kitchen HYDROcodone-acetaminophen (NORCO) 5-325 MG tablet Take 1-2 tablets by mouth every 4 (four) hours as needed for moderate pain. 40 tablet 0  . Hypromellose  (ARTIFICIAL TEARS OP) Apply 1 drop to eye 4 (four) times daily as needed (dry eyes).    Marland Kitchen ketotifen (ZADITOR) 0.025 % ophthalmic solution Place 1 drop into both eyes 2 (two) times daily as needed (allergies).    . loratadine (CLARITIN) 10 MG tablet Take 10 mg by mouth daily as needed (seasonal allergies).     . losartan (COZAAR) 100 MG tablet Take 50 mg by mouth daily.     . Multiple Vitamin (MULTIVITAMIN WITH MINERALS) TABS tablet Take 1 tablet by mouth daily.    . Naproxen Sodium (ALEVE) 220 MG CAPS Take 220 mg by mouth 2 (two) times daily.     . ondansetron (ZOFRAN) 4 MG tablet Take 1 tablet (4 mg total) by mouth every 8 (eight) hours as needed for nausea or vomiting. 40 tablet 0  . vitamin C (ASCORBIC ACID) 500 MG tablet Take 500 mg by mouth daily.      No current facility-administered medications for this visit.     Family History  Problem Relation Age of Onset  . Hypertension Mother   . Hypertension Sister 62  . Peripheral Artery Disease Sister   . Hypertension Maternal Grandfather   . Colon cancer Neg Hx   . Breast cancer Neg Hx     ROS:  Pertinent items are noted in HPI.  Otherwise, a comprehensive ROS was negative.  Exam:   LMP 01/21/1977 (Approximate)    Ht Readings from Last 3 Encounters:  03/07/17 5\' 4"  (1.626 m)  10/30/16 5' 2.75" (1.594 m)  08/13/16 5\' 3"  (1.6 m)    General appearance: alert, cooperative and appears stated age Head: Normocephalic, without obvious abnormality, atraumatic Neck: no adenopathy, supple, symmetrical, trachea midline and thyroid normal to inspection and palpation Lungs: clear to auscultation bilaterally Breasts: normal appearance, no masses or tenderness, No nipple retraction or dimpling, No nipple discharge or bleeding, No axillary or supraclavicular adenopathy Heart: regular rate and rhythm Abdomen: soft, non-tender; no masses,  no organomegaly Extremities: extremities normal, atraumatic, no cyanosis or edema Skin: Skin color,  texture, turgor normal. No rashes or lesions Lymph nodes: Cervical, supraclavicular, and axillary nodes normal. No abnormal inguinal nodes palpated Neurologic: Grossly normal   Pelvic: External genitalia:  no lesions, atrophic appearance              Urethra:  normal appearing urethra with no masses, tenderness or lesions              Bartholin's and Skene's: normal                 Vagina: normal appearing vagina with dryness noted at introitus only normal color and scant discharge, no lesions              Cervix: no cervical motion tenderness, no lesions and normal appearance  Pap taken: No. Bimanual Exam:  Uterus:  normal size, contour, position, consistency, mobility, non-tender and anteverted              Adnexa: normal adnexa and no mass, fullness, tenderness               Rectovaginal: Confirms               Anus:  normal sphincter tone, no lesions, tiny hemorrhoid noted on right side of anal entrance, not thrombosed, very slight tenderness  Chaperone present: yes  A:  Well Woman with normal exam  Menopausal no HRT  Atrophic vaginitis  Hemorrhoid, not thrombosed  Hypertension, cholesterol management with PCP  Recent cataract surgery and right shoulder surgery all healed well  P:   Reviewed health and wellness pertinent to exam  Aware of need to advise if vaginal bleeding  Discussed coconut or Olive oil use for vaginal dryness and sexual activity.  Discussed hemorrhoid finding and use of Colace daily until resolves, can decrease if having too loose stools. Apply Balmex (OTC) to area twice daily. Report black tarry stools or change in status.   Continue follow up with PCP as indicated  Pap smear: no   counseled on breast self exam, mammography screening, feminine hygiene, adequate intake of calcium and vitamin D, diet and exercise, Kegel's exercises  return annually or prn  An After Visit Summary was printed and given to the patient.

## 2018-03-24 DIAGNOSIS — H6123 Impacted cerumen, bilateral: Secondary | ICD-10-CM | POA: Insufficient documentation

## 2018-10-14 ENCOUNTER — Other Ambulatory Visit: Payer: Self-pay | Admitting: Certified Nurse Midwife

## 2018-10-14 DIAGNOSIS — Z1231 Encounter for screening mammogram for malignant neoplasm of breast: Secondary | ICD-10-CM

## 2018-11-17 ENCOUNTER — Other Ambulatory Visit: Payer: Self-pay

## 2018-11-18 ENCOUNTER — Encounter: Payer: Self-pay | Admitting: Certified Nurse Midwife

## 2018-11-18 ENCOUNTER — Ambulatory Visit (INDEPENDENT_AMBULATORY_CARE_PROVIDER_SITE_OTHER): Payer: Medicare Other | Admitting: Certified Nurse Midwife

## 2018-11-18 ENCOUNTER — Other Ambulatory Visit: Payer: Self-pay

## 2018-11-18 VITALS — BP 118/68 | HR 64 | Temp 97.1°F | Resp 16 | Ht 62.75 in | Wt 164.0 lb

## 2018-11-18 DIAGNOSIS — Z01419 Encounter for gynecological examination (general) (routine) without abnormal findings: Secondary | ICD-10-CM | POA: Diagnosis not present

## 2018-11-18 NOTE — Patient Instructions (Signed)

## 2018-11-18 NOTE — Progress Notes (Signed)
78 y.o. [id number] Married Caucasian Fe here for annual exam.Post menopausal denies vaginal bleeding or vaginal dryness. Sees PCP for aex for labs and hypertension and cholesterol all stable now. Staying active with walking and going to gym with spouse. No health issues today.  Patient's last menstrual period was 01/21/1977 (approximate).          Sexually active: Yes.    The current method of family planning is post menopausal status.    Exercising: Yes.    tennis, walking, gym Smoker:  no  Review of Systems  Constitutional: Negative.   HENT: Negative.   Eyes: Negative.   Respiratory: Negative.   Cardiovascular: Negative.   Gastrointestinal: Negative.   Genitourinary: Negative.   Musculoskeletal: Negative.   Skin: Negative.   Neurological: Negative.   Endo/Heme/Allergies: Negative.   Psychiatric/Behavioral: Negative.     Health Maintenance: Pap:  10-30-16 neg. 10/12/2014 negative History of Abnormal Pap: no MMG:  10-28-17 category b density birads 1:neg, scheduled for 11/2018 Self Breast exams: yes Colonoscopy:  2017 normal BMD:  2016 dr Osborne Casco normal TDaP:  Will check with pcp Shingles: 2020 had first one Pneumonia: 2015 Hep C and HIV: not done Labs: no   reports that she quit smoking about 39 years ago. Her smoking use included cigarettes. She has a 11.00 pack-year smoking history. She has never used smokeless tobacco. She reports current alcohol use of about 7.0 standard drinks of alcohol per week. She reports that she does not use drugs.  Past Medical History:  Diagnosis Date  . Arthritis    "primarily in my knee & thumbs" (10/15/2014)  . Hypercholesterolemia   . Hypertension   . Melanoma of lower leg ([place]) 05/2003   "left"  . Seasonal allergies   . Squamous carcinoma    "all on the left side:  under eye, nostril, bicept"    Past Surgical History:  Procedure Laterality Date  . COLONOSCOPY    . DILATION AND CURETTAGE OF UTERUS  1968  . JOINT REPLACEMENT Bilateral     knee-    . KNEE ARTHROSCOPY Left 05/06/2012   torn miniscus  . MELANOMA EXCISION Left 05/2003   leg  . MOHS SURGERY Left 2000's   "squamous; under eye and nostril"  . SQUAMOUS CELL CARCINOMA EXCISION Left 2008   upper bicept  . TONSILLECTOMY AND ADENOIDECTOMY  1949  . TOTAL KNEE ARTHROPLASTY Left 10/14/2014  . TOTAL KNEE ARTHROPLASTY Left 10/14/2014   Procedure: TOTAL KNEE ARTHROPLASTY;  Surgeon: Earlie Server, MD;  Location: [place];  Service: Orthopedics;  Laterality: Left;  . TOTAL KNEE ARTHROPLASTY Right 02/10/2015   Procedure: RIGHT TOTAL KNEE ARTHROPLASTY;  Surgeon: Earlie Server, MD;  Location: [place];  Service: Orthopedics;  Laterality: Right;  . TOTAL SHOULDER ARTHROPLASTY Right 08/13/2016   Procedure: TOTAL SHOULDER ARTHROPLASTY;  Surgeon: Renette Butters, MD;  Location: [place];  Service: Orthopedics;  Laterality: Right;  . TOTAL SHOULDER ARTHROPLASTY Left 03/18/2017   Procedure: TOTAL SHOULDER ARTHROPLASTY;  Surgeon: Renette Butters, MD;  Location: [place];  Service: Orthopedics;  Laterality: Left;    Current Outpatient Medications  Medication Sig Dispense Refill  . acetaminophen (TYLENOL) 500 MG tablet Take 1,000 mg by mouth every 6 (six) hours as needed.    Marland Kitchen aspirin EC 81 MG tablet Take 81 mg by mouth daily.    Marland Kitchen atorvastatin (LIPITOR) 10 MG tablet Take 10 mg by mouth every evening.     . Calcium Carbonate-Vitamin D (CALCIUM 600+D PO) Take 1 tablet  by mouth daily.    . cholecalciferol (VITAMIN D) 1000 UNITS tablet Take 1,000 Units by mouth daily.     Mariane Baumgarten Calcium (STOOL SOFTENER PO) Take by mouth.    . famotidine (PEPCID) 20 MG tablet Take 20 mg by mouth daily.     . Hypromellose (ARTIFICIAL TEARS OP) Apply 1 drop to eye 4 (four) times daily as needed (dry eyes).    Marland Kitchen ketotifen (ZADITOR) 0.025 % ophthalmic solution Place 1 drop into both eyes 2 (two) times daily as needed (allergies).    . loratadine (CLARITIN) 10 MG tablet Take 10 mg by mouth daily as needed (seasonal  allergies).     . losartan (COZAAR) 50 MG tablet Take 50 mg by mouth daily.    . Multiple Vitamin (MULTIVITAMIN WITH MINERALS) TABS tablet Take 1 tablet by mouth daily.    . Naproxen Sodium (ALEVE) 220 MG CAPS Take 220 mg by mouth 2 (two) times daily.     . Probiotic Product (PROBIOTIC PO) Take by mouth every other day.    . vitamin C (ASCORBIC ACID) 500 MG tablet Take 500 mg by mouth daily.      No current facility-administered medications for this visit.     Family History  Problem Relation Age of Onset  . Hypertension Mother   . Hypertension Sister 44  . Peripheral Artery Disease Sister   . Hypertension Maternal Grandfather   . Colon cancer Neg Hx   . Breast cancer Neg Hx     ROS:  Pertinent items are noted in HPI.  Otherwise, a comprehensive ROS was negative.  Exam:   BP 118/68   Pulse 64   Temp (!) 97.1 F (36.2 C) (Skin)   Resp 16   Ht 5' 2.75" (1.594 m)   Wt 164 lb (74.4 kg)   LMP 01/21/1977 (Approximate)   BMI 29.28 kg/m  Height: 5' 2.75" (159.4 cm) Ht Readings from Last 3 Encounters:  11/18/18 5' 2.75" (1.594 m)  11/12/17 5' 2.5" (1.588 m)  03/07/17 5\' 4"  (1.626 m)    General appearance: alert, cooperative and appears stated age Head: Normocephalic, without obvious abnormality, atraumatic Neck: no adenopathy, supple, symmetrical, trachea midline and thyroid normal to inspection and palpation Lungs: clear to auscultation bilaterally Breasts: normal appearance, no masses or tenderness, No nipple retraction or dimpling, No nipple discharge or bleeding, No axillary or supraclavicular adenopathy Heart: regular rate and rhythm Abdomen: soft, non-tender; no masses,  no organomegaly Extremities: extremities normal, atraumatic, no cyanosis or edema Skin: Skin color, texture, turgor normal. No rashes or lesions Lymph nodes: Cervical, supraclavicular, and axillary nodes normal. No abnormal inguinal nodes palpated Neurologic: Grossly normal   Pelvic: External  genitalia:  no lesions              Urethra:  normal appearing urethra with no masses, tenderness or lesions              Bartholin's and Skene's: normal                 Vagina: normal appearing vagina with normal color and discharge, no lesions, moisture noted              Cervix: no cervical motion tenderness, no lesions and normal appearance              Pap taken: No. Bimanual Exam:  Uterus:  normal size, contour, position, consistency, mobility, non-tender  Adnexa: normal adnexa and no mass, fullness, tenderness               Rectovaginal: Confirms               Anus:  normal sphincter tone, no lesions  Chaperone present: yes  A:  Well Woman with normal exam  Post menopausal no HRT  Hypertension/cholesterol management with PCP  Mammogram due has scheduled  Immunization due TDAP   P:   Reviewed health and wellness pertinent to exam  Aware of need to advise if vaginal bleeding or dryness.  Continue follow up with PCP as indicated.  Declined plans to do with PCP  Pap smear: no   counseled on breast self exam, mammography screening, feminine hygiene, adequate intake of calcium and vitamin D, diet and exercise  return annually or prn  An After Visit Summary was printed and given to the patient.

## 2018-11-26 ENCOUNTER — Ambulatory Visit
Admission: RE | Admit: 2018-11-26 | Discharge: 2018-11-26 | Disposition: A | Discharge status: Home / Self Care | Payer: Medicare Other | Source: Ambulatory Visit | Attending: Certified Nurse Midwife | Admitting: Certified Nurse Midwife

## 2018-11-26 ENCOUNTER — Other Ambulatory Visit: Payer: Self-pay

## 2018-11-26 DIAGNOSIS — Z1231 Encounter for screening mammogram for malignant neoplasm of breast: Secondary | ICD-10-CM

## 2019-02-15 ENCOUNTER — Ambulatory Visit: Payer: Medicare Other | Attending: Internal Medicine

## 2019-02-15 DIAGNOSIS — Z23 Encounter for immunization: Secondary | ICD-10-CM | POA: Insufficient documentation

## 2019-02-15 NOTE — Progress Notes (Signed)
   Covid-19 Vaccination Clinic  Name:  Ashley Cooke    MRN: [id number] DOB: [birth year]  02/15/2019  Ashley Cooke was observed post Covid-19 immunization for 15 minutes without incidence. She was provided with Vaccine Information Sheet and instruction to access the V-Safe system.   Ashley Cooke was instructed to call 911 with any severe reactions post vaccine: Marland Kitchen Difficulty breathing  . Swelling of your face and throat  . A fast heartbeat  . A bad rash all over your body  . Dizziness and weakness    Immunizations Administered    Name Date Dose VIS Date Route   Pfizer COVID-19 Vaccine 02/15/2019 12:17 PM 0.3 mL 01/01/2019 Intramuscular   Manufacturer: [place]   Lot: [id number]   [place]: [id number]

## 2019-03-08 ENCOUNTER — Ambulatory Visit: Payer: Medicare Other | Attending: Internal Medicine

## 2019-03-08 NOTE — Progress Notes (Signed)
   Covid-19 Vaccination Clinic  Name:  Ashley Cooke    MRN: [id number] DOB: 17-Nov-1940  03/08/2019  Ashley Cooke was observed post Covid-19 immunization for 15 minutes without incidence. She was provided with Vaccine Information Sheet and instruction to access the V-Safe system.   Ashley Cooke was instructed to call 911 with any severe reactions post vaccine: Marland Kitchen Difficulty breathing  . Swelling of your face and throat  . A fast heartbeat  . A bad rash all over your body  . Dizziness and weakness

## 2019-04-13 ENCOUNTER — Encounter: Payer: Self-pay | Admitting: Certified Nurse Midwife

## 2019-10-19 ENCOUNTER — Other Ambulatory Visit: Payer: Self-pay | Admitting: Obstetrics and Gynecology

## 2019-10-19 DIAGNOSIS — Z0000 Encounter for general adult medical examination without abnormal findings: Secondary | ICD-10-CM

## 2020-02-14 ENCOUNTER — Ambulatory Visit: Payer: Medicare Other | Admitting: Certified Nurse Midwife

## 2020-03-28 ENCOUNTER — Other Ambulatory Visit: Payer: Self-pay

## 2020-03-28 ENCOUNTER — Ambulatory Visit
Admission: RE | Admit: 2020-03-28 | Discharge: 2020-03-28 | Disposition: A | Discharge status: Home / Self Care | Payer: Medicare Other | Source: Ambulatory Visit | Attending: Obstetrics and Gynecology | Admitting: Obstetrics and Gynecology

## 2020-03-28 DIAGNOSIS — Z0000 Encounter for general adult medical examination without abnormal findings: Secondary | ICD-10-CM

## 2020-04-20 ENCOUNTER — Ambulatory Visit: Payer: Medicare Other | Admitting: Obstetrics and Gynecology

## 2020-04-20 ENCOUNTER — Other Ambulatory Visit: Payer: Self-pay

## 2020-04-20 ENCOUNTER — Encounter: Payer: Self-pay | Admitting: Obstetrics and Gynecology

## 2020-04-20 VITALS — BP 138/72 | HR 74 | Ht 63.0 in | Wt 169.0 lb

## 2020-04-20 DIAGNOSIS — Z01419 Encounter for gynecological examination (general) (routine) without abnormal findings: Secondary | ICD-10-CM | POA: Diagnosis not present

## 2020-04-20 NOTE — Patient Instructions (Signed)

## 2020-04-20 NOTE — Progress Notes (Signed)
80 y.o. [id number] Married White or Caucasian Not Hispanic or Latino female here for annual exam.  No c/o.  No vaginal bleeding. No bowel or bladder c/o. Takes a stool softener and pro biotic.   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 Intimate without penetration.     Patient's last menstrual period was 01/21/1977 (approximate).          Sexually active: No.  The current method of family planning is post menopausal status.    Exercising: Yes.    Tennis, swimming.  Smoker:  no  Health Maintenance: Pap:  10-30-16 neg. 10/12/2014 negative History of abnormal Pap:  no MMG:  03/31/20 density C Bi-rads 1 Neg  BMD: 2016 dr Osborne Casco normal Colonoscopy: 2017 normal  TDaP:  05/31/19 Gardasil: NA   reports that she quit smoking about 40 years ago. Her smoking use included cigarettes. She has a 11.00 pack-year smoking history. She has never used smokeless tobacco. She reports current alcohol use of about 7.0 standard drinks of alcohol per week. She reports that she does not use drugs. 4 kids, 4 grand children. No one is local.   Past Medical History:  Diagnosis Date  . Arthritis    "primarily in my knee & thumbs" (10/15/2014)  . Hypercholesterolemia   . Hypertension   . Melanoma of lower leg ([place]) 05/2003   "left"  . Seasonal allergies   . Squamous carcinoma    "all on the left side:  under eye, nostril, bicept"    Past Surgical History:  Procedure Laterality Date  . cataract Bilateral 05/21/2017  . COLONOSCOPY    . DILATION AND CURETTAGE OF UTERUS  1968  . JOINT REPLACEMENT Bilateral    knee-    . KNEE ARTHROSCOPY Left 05/06/2012   torn miniscus  . MELANOMA EXCISION Left 05/2003   leg  . MOHS SURGERY Left 2000's   "squamous; under eye and nostril"  . SQUAMOUS CELL CARCINOMA EXCISION Left 2008   upper bicept  . TONSILLECTOMY AND ADENOIDECTOMY  1949  . TOTAL KNEE ARTHROPLASTY Left 10/14/2014  . TOTAL KNEE ARTHROPLASTY Left 10/14/2014   Procedure: TOTAL KNEE ARTHROPLASTY;  Surgeon: Earlie Server, MD;  Location: [place];  Service: Orthopedics;  Laterality: Left;  . TOTAL KNEE ARTHROPLASTY Right 02/10/2015   Procedure: RIGHT TOTAL KNEE ARTHROPLASTY;  Surgeon: Earlie Server, MD;  Location: [place];  Service: Orthopedics;  Laterality: Right;  . TOTAL SHOULDER ARTHROPLASTY Right 08/13/2016   Procedure: TOTAL SHOULDER ARTHROPLASTY;  Surgeon: Renette Butters, MD;  Location: [place];  Service: Orthopedics;  Laterality: Right;  . TOTAL SHOULDER ARTHROPLASTY Left 03/18/2017   Procedure: TOTAL SHOULDER ARTHROPLASTY;  Surgeon: Renette Butters, MD;  Location: [place];  Service: Orthopedics;  Laterality: Left;    Current Outpatient Medications  Medication Sig Dispense Refill  . acetaminophen (TYLENOL) 500 MG tablet Take 1,000 mg by mouth every 6 (six) hours as needed.    Marland Kitchen aspirin EC 81 MG tablet Take 81 mg by mouth daily.    Marland Kitchen atorvastatin (LIPITOR) 10 MG tablet Take 10 mg by mouth every evening.     . Calcium Carbonate-Vitamin D (CALCIUM 600+D PO) Take 1 tablet by mouth daily.    . cholecalciferol (VITAMIN D) 1000 UNITS tablet Take 1,000 Units by mouth daily.     Mariane Baumgarten Calcium (STOOL SOFTENER PO) Take by mouth.    . famotidine (PEPCID) 20 MG tablet Take 20 mg by mouth daily.    . Hypromellose (ARTIFICIAL TEARS OP) Apply 1 drop  to eye 4 (four) times daily as needed (dry eyes).    Marland Kitchen ketotifen (ZADITOR) 0.025 % ophthalmic solution Place 1 drop into both eyes 2 (two) times daily as needed (allergies).    . loratadine (CLARITIN) 10 MG tablet Take 10 mg by mouth daily as needed (seasonal allergies).     . losartan (COZAAR) 50 MG tablet Take 50 mg by mouth daily.    . Multiple Vitamin (MULTIVITAMIN WITH MINERALS) TABS tablet Take 1 tablet by mouth daily.    . Naproxen Sodium 220 MG CAPS Take 220 mg by mouth 2 (two) times daily.     . Probiotic Product (PROBIOTIC PO) Take by mouth every other day.    . vitamin C (ASCORBIC ACID) 500 MG tablet Take 500 mg by mouth daily.      No current  facility-administered medications for this visit.    Family History  Problem Relation Age of Onset  . Hypertension Mother   . Hypertension Sister 67  . Peripheral Artery Disease Sister   . Hypertension Maternal Grandfather   . Colon cancer Neg Hx   . Breast cancer Neg Hx     Review of Systems  All other systems reviewed and are negative.   Exam:   BP 138/72   Pulse 74   Ht 5\' 3"  (1.6 m)   Wt 169 lb (76.7 kg)   LMP 01/21/1977 (Approximate)   SpO2 99%   BMI 29.94 kg/m   Weight change: @WEIGHTCHANGE @ Height:   Height: 5\' 3"  (160 cm)  Ht Readings from Last 3 Encounters:  04/20/20 5\' 3"  (1.6 m)  11/18/18 5' 2.75" (1.594 m)  11/12/17 5' 2.5" (1.588 m)    General appearance: alert, cooperative and appears stated age Head: Normocephalic, without obvious abnormality, atraumatic Neck: no adenopathy, supple, symmetrical, trachea midline and thyroid normal to inspection and palpation Lungs: clear to auscultation bilaterally Cardiovascular: regular rate and rhythm Breasts: normal appearance, no masses or tenderness Abdomen: soft, non-tender; non distended,  no masses,  no organomegaly Extremities: extremities normal, atraumatic, no cyanosis or edema Skin: Skin color, texture, turgor normal. No rashes or lesions Lymph nodes: Cervical, supraclavicular, and axillary nodes normal. No abnormal inguinal nodes palpated Neurologic: Grossly normal   Pelvic: External genitalia:  no lesions              Urethra:  normal appearing urethra with no masses, tenderness or lesions              Bartholins and Skenes: normal                 Vagina: atrophic appearing vagina with normal color and discharge, no lesions              Cervix: no lesions               Bimanual Exam:  Uterus:  normal size, contour, position, consistency, mobility, non-tender              Adnexa: no mass, fullness, tenderness               Rectovaginal: Confirms               Anus:  normal sphincter tone, no  lesions    1. Encounter for gynecological examination without abnormal finding Discussed breast self exam Discussed calcium and vit D intake Mammogram and colonoscopy UTD No pap needed Labs with primary

## 2021-02-16 ENCOUNTER — Other Ambulatory Visit: Payer: Self-pay | Admitting: Obstetrics and Gynecology

## 2021-02-16 DIAGNOSIS — Z1231 Encounter for screening mammogram for malignant neoplasm of breast: Secondary | ICD-10-CM

## 2021-03-29 ENCOUNTER — Ambulatory Visit: Payer: Medicare Other

## 2021-04-04 ENCOUNTER — Ambulatory Visit
Admission: RE | Admit: 2021-04-04 | Discharge: 2021-04-04 | Disposition: A | Discharge status: Home / Self Care | Payer: Medicare Other | Source: Ambulatory Visit | Attending: Obstetrics and Gynecology | Admitting: Obstetrics and Gynecology

## 2021-04-04 DIAGNOSIS — Z1231 Encounter for screening mammogram for malignant neoplasm of breast: Secondary | ICD-10-CM

## 2021-04-17 NOTE — Progress Notes (Signed)
81 y.o. [id number] Married White or Caucasian Not Hispanic or Latino female here for annual exam.  No vaginal bleeding. ?Married for 58 years. Not sexually active.  ? ?No bowel or bladder c/o.  ?  ? ?Patient's last menstrual period was 01/21/1977 (approximate).          ?Sexually active: No.  ?The current method of family planning is post menopausal status.    ?Exercising: Yes.     Tennis and swimming the gym  ?Smoker:  no ? ?Health Maintenance: ?Pap:  10-30-16 neg. 10/12/2014 negative ?History of abnormal Pap:  no ?MMG:  04/04/21 density B Bi-rads 1 neg  ?BMD:   2016 dr Osborne Casco normal ?Colonoscopy: 2017 normal  ?TDaP:   05/31/19 ?Gardasil: n/a ? ? reports that she quit smoking about 41 years ago. Her smoking use included cigarettes. She has a 11.00 pack-year smoking history. She has never used smokeless tobacco. She reports current alcohol use of about 7.0 standard drinks per week. She reports that she does not use drugs. 4 kids, 6 grand children. No one is local.  ?  ? ?Past Medical History:  ?Diagnosis Date  ? Arthritis   ? "primarily in my knee & thumbs" (10/15/2014)  ? Hypercholesterolemia   ? Hypertension   ? Melanoma of lower leg ([place]) 05/2003  ? "left"  ? Seasonal allergies   ? Squamous carcinoma   ? "all on the left side:  under eye, nostril, bicept"  ? ? ?Past Surgical History:  ?Procedure Laterality Date  ? cataract Bilateral 05/21/2017  ? COLONOSCOPY    ? [place] OF UTERUS  1968  ? JOINT REPLACEMENT Bilateral   ? knee-    ? KNEE ARTHROSCOPY Left 05/06/2012  ? torn miniscus  ? MELANOMA EXCISION Left 05/2003  ? leg  ? MOHS SURGERY Left 2000's  ? "squamous; under eye and nostril"  ? SQUAMOUS CELL CARCINOMA EXCISION Left 2008  ? upper bicept  ? TONSILLECTOMY AND ADENOIDECTOMY  1949  ? TOTAL KNEE ARTHROPLASTY Left 10/14/2014  ? TOTAL KNEE ARTHROPLASTY Left 10/14/2014  ? Procedure: TOTAL KNEE ARTHROPLASTY;  Surgeon: Earlie Server, MD;  Location: [place];  Service: Orthopedics;  Laterality: Left;  ? TOTAL  KNEE ARTHROPLASTY Right 02/10/2015  ? Procedure: RIGHT TOTAL KNEE ARTHROPLASTY;  Surgeon: Earlie Server, MD;  Location: [place];  Service: Orthopedics;  Laterality: Right;  ? TOTAL SHOULDER ARTHROPLASTY Right 08/13/2016  ? Procedure: TOTAL SHOULDER ARTHROPLASTY;  Surgeon: Renette Butters, MD;  Location: [place];  Service: Orthopedics;  Laterality: Right;  ? TOTAL SHOULDER ARTHROPLASTY Left 03/18/2017  ? Procedure: TOTAL SHOULDER ARTHROPLASTY;  Surgeon: Renette Butters, MD;  Location: [place];  Service: Orthopedics;  Laterality: Left;  ? ? ?Current Outpatient Medications  ?Medication Sig Dispense Refill  ? acetaminophen (TYLENOL) 500 MG tablet Take 1,000 mg by mouth every 6 (six) hours as needed.    ? aspirin EC 81 MG tablet Take 81 mg by mouth daily.    ? atorvastatin (LIPITOR) 10 MG tablet Take 10 mg by mouth every evening.     ? Calcium Carbonate-Vitamin D (CALCIUM 600+D PO) Take 1 tablet by mouth daily.    ? cholecalciferol (VITAMIN D) 1000 UNITS tablet Take 1,000 Units by mouth daily.     ? Docusate Calcium (STOOL SOFTENER PO) Take by mouth.    ? famotidine (PEPCID) 20 MG tablet Take 20 mg by mouth daily.    ? Hypromellose (ARTIFICIAL TEARS OP) Apply 1 drop to eye 4 (four) times  daily as needed (dry eyes).    ? ketotifen (ZADITOR) 0.025 % ophthalmic solution Place 1 drop into both eyes 2 (two) times daily as needed (allergies).    ? loratadine (CLARITIN) 10 MG tablet Take 10 mg by mouth daily as needed (seasonal allergies).     ? losartan (COZAAR) 50 MG tablet Take 50 mg by mouth daily.    ? Multiple Vitamin (MULTIVITAMIN WITH MINERALS) TABS tablet Take 1 tablet by mouth daily.    ? Naproxen Sodium 220 MG CAPS Take 220 mg by mouth 2 (two) times daily.     ? Probiotic Product (PROBIOTIC PO) Take by mouth every other day.    ? vitamin C (ASCORBIC ACID) 500 MG tablet Take 500 mg by mouth daily.     ? ?No current facility-administered medications for this visit.  ? ? ?Family History  ?Problem Relation Age of Onset  ?  Hypertension Mother   ? Hypertension Sister 7  ? Peripheral Artery Disease Sister   ? Hypertension Maternal Grandfather   ? Colon cancer Neg Hx   ? Breast cancer Neg Hx   ? ? ?Review of Systems  ?All other systems reviewed and are negative. ? ?Exam:   ?BP 128/64   Pulse 76   Ht 5' 2.21" (1.58 m)   Wt 169 lb (76.7 kg)   LMP 01/21/1977 (Approximate)   SpO2 99%   BMI 30.71 kg/m?   Weight change: '@WEIGHTCHANGE'$ @ Height:   Height: 5' 2.21" (158 cm)  ?Ht Readings from Last 3 Encounters:  ?04/24/21 5' 2.21" (1.58 m)  ?04/20/20 '5\' 3"'$  (1.6 m)  ?11/18/18 5' 2.75" (1.594 m)  ? ? ?General appearance: alert, cooperative and appears stated age ?Head: Normocephalic, without obvious abnormality, atraumatic ?Neck: no adenopathy, supple, symmetrical, trachea midline and thyroid normal to inspection and palpation ?Breasts: normal appearance, no masses or tenderness ?Abdomen: soft, non-tender; non distended,  no masses,  no organomegaly ?Extremities: extremities normal, atraumatic, no cyanosis or edema ?Skin: Skin color, texture, turgor normal. No rashes or lesions ?Lymph nodes: Cervical, supraclavicular, and axillary nodes normal. ?No abnormal inguinal nodes palpated ?Neurologic: Grossly normal ? ? ?Pelvic: External genitalia:  no lesions ?             Urethra:  normal appearing urethra with no masses, tenderness or lesions ?             Bartholins and Skenes: normal    ?             Vagina: normal appearing vagina with normal color and discharge, no lesions ?             Cervix: no lesions ?              ?Bimanual Exam:  Uterus:   no masses or tendernes ?             Adnexa: no mass, fullness, tenderness ?              Rectovaginal: Confirms ?              Anus:  normal sphincter tone, no lesions ? ?Gae Dry chaperoned for the exam. ? ?1. Gynecologic exam normal ?No pap needed ?Mammogram, colonoscopy and Dexa are UTD ?Labs with primary ? ? ? ?

## 2021-04-24 ENCOUNTER — Encounter: Payer: Self-pay | Admitting: Obstetrics and Gynecology

## 2021-04-24 ENCOUNTER — Ambulatory Visit (INDEPENDENT_AMBULATORY_CARE_PROVIDER_SITE_OTHER): Payer: Medicare Other | Admitting: Obstetrics and Gynecology

## 2021-04-24 VITALS — BP 128/64 | HR 76 | Ht 62.21 in | Wt 169.0 lb

## 2021-04-24 DIAGNOSIS — Z01419 Encounter for gynecological examination (general) (routine) without abnormal findings: Secondary | ICD-10-CM | POA: Diagnosis not present

## 2021-04-24 DIAGNOSIS — M85851 Other specified disorders of bone density and structure, right thigh: Secondary | ICD-10-CM | POA: Insufficient documentation

## 2021-04-24 DIAGNOSIS — M85852 Other specified disorders of bone density and structure, left thigh: Secondary | ICD-10-CM | POA: Insufficient documentation

## 2022-04-19 IMAGING — MG MM DIGITAL SCREENING BILAT W/ TOMO AND CAD
8 series · 8 of 24 positions shown · non-contrast
Comparison: Previous exam(s).

CLINICAL DATA: Screening.

EXAM:
DIGITAL SCREENING BILATERAL MAMMOGRAM WITH TOMOSYNTHESIS AND CAD
TECHNIQUE: Bilateral screening digital craniocaudal and mediolateral oblique
mammograms were obtained. Bilateral screening digital breast
tomosynthesis was performed. The images were evaluated with
computer-aided detection.

[L MLO synth-2D]
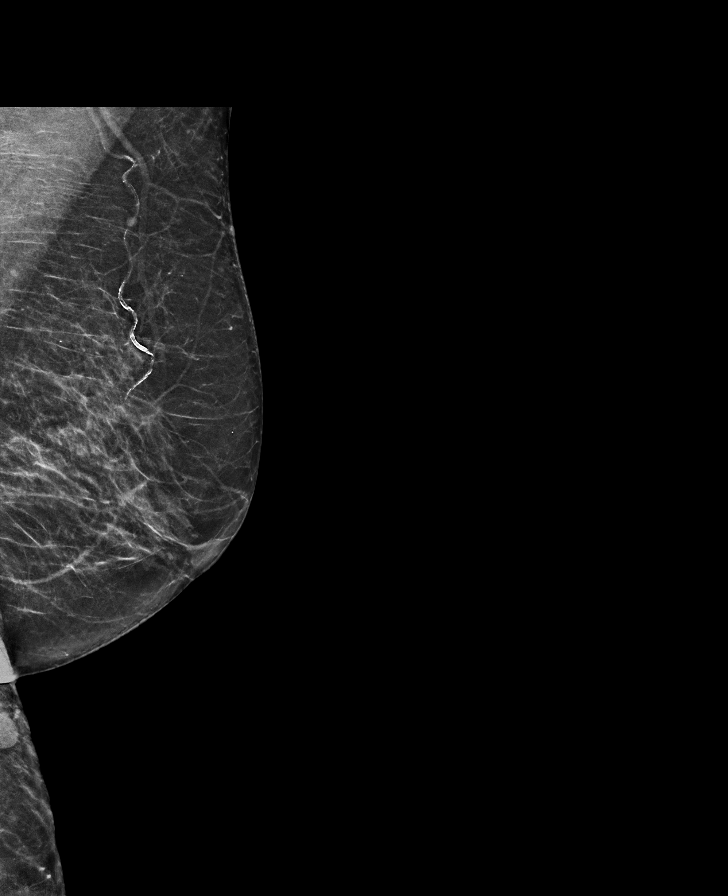

[R MLO synth-2D]
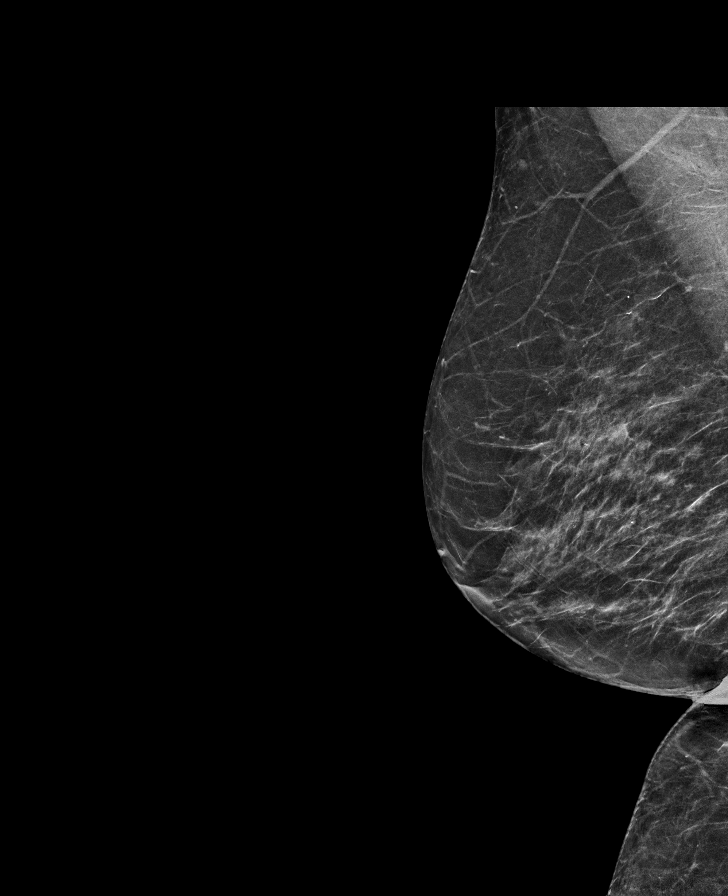

[L CC synth-2D]
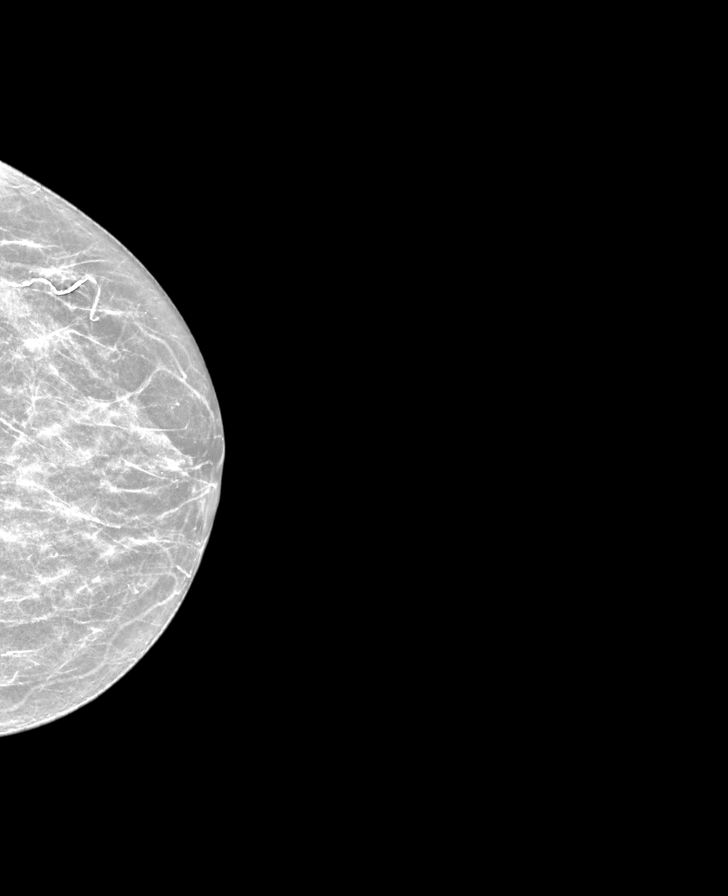

[R CC synth-2D]
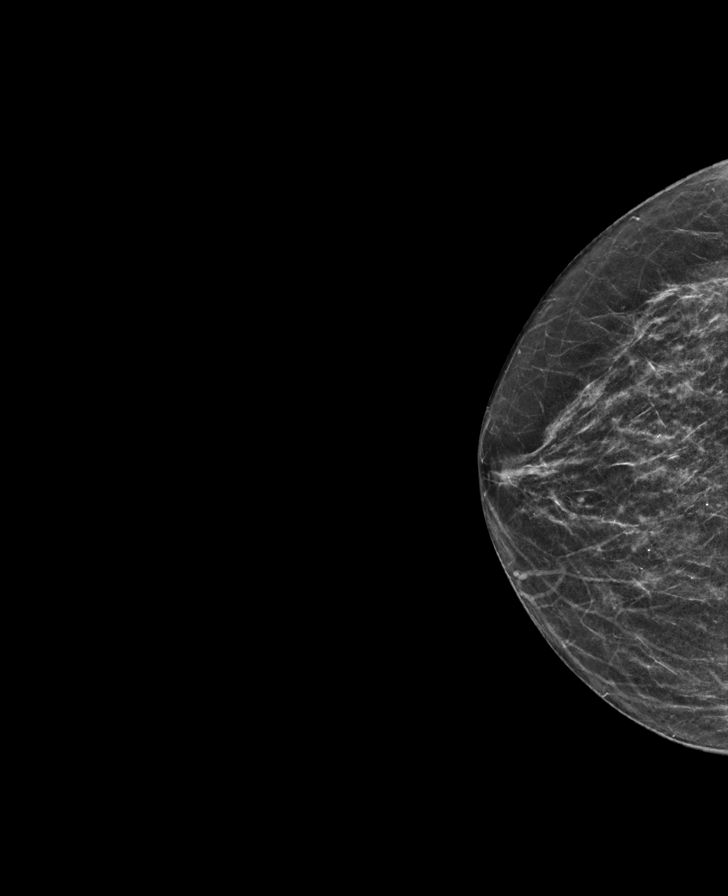

[L MLO tomo · tomo slice 28/55.0]
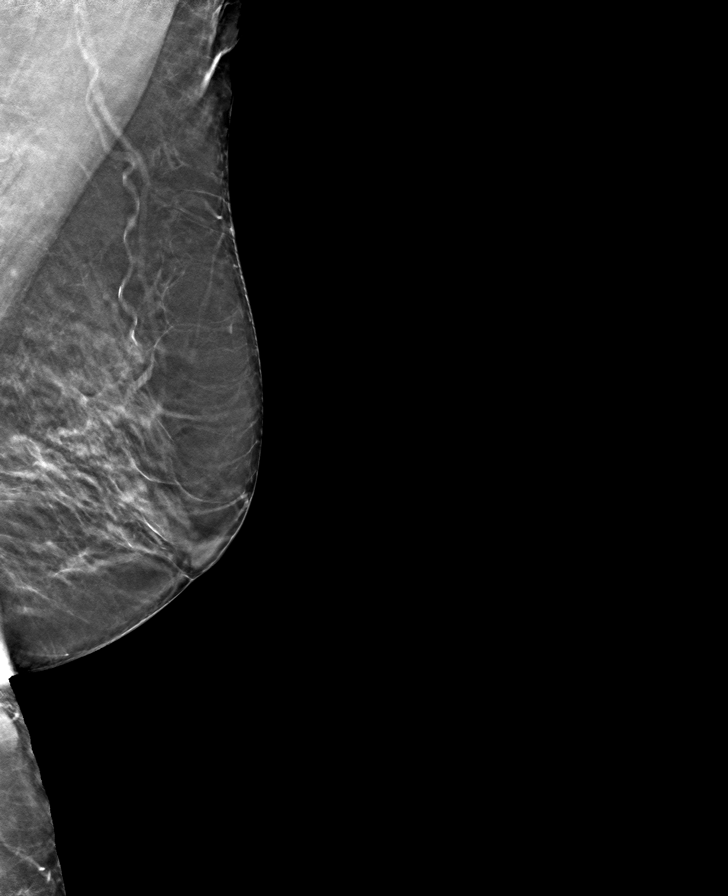

[R CC tomo · tomo slice 28/55.0]
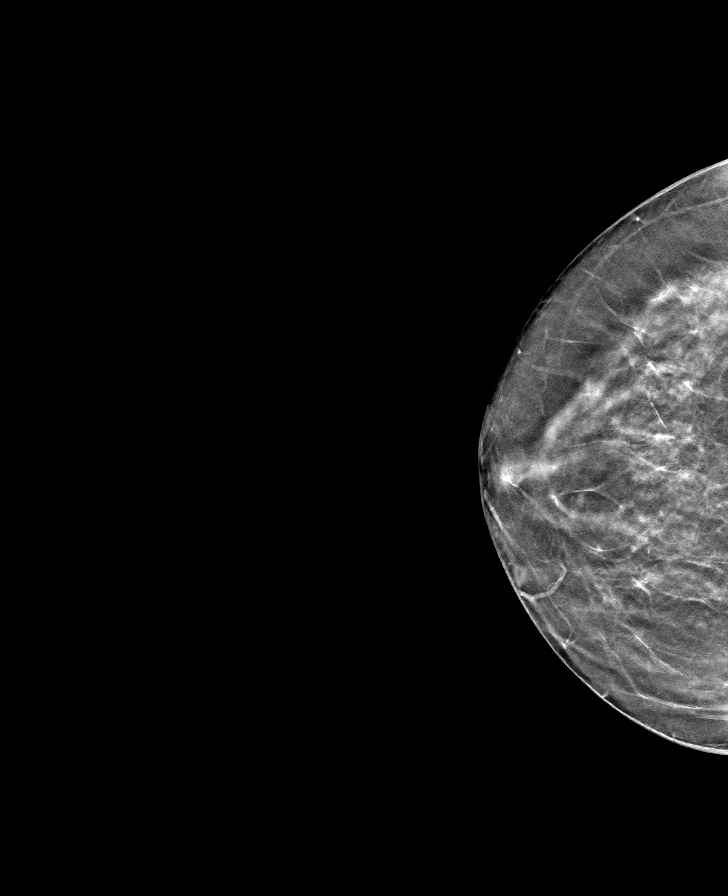

[L CC tomo · tomo slice 23/44.0]
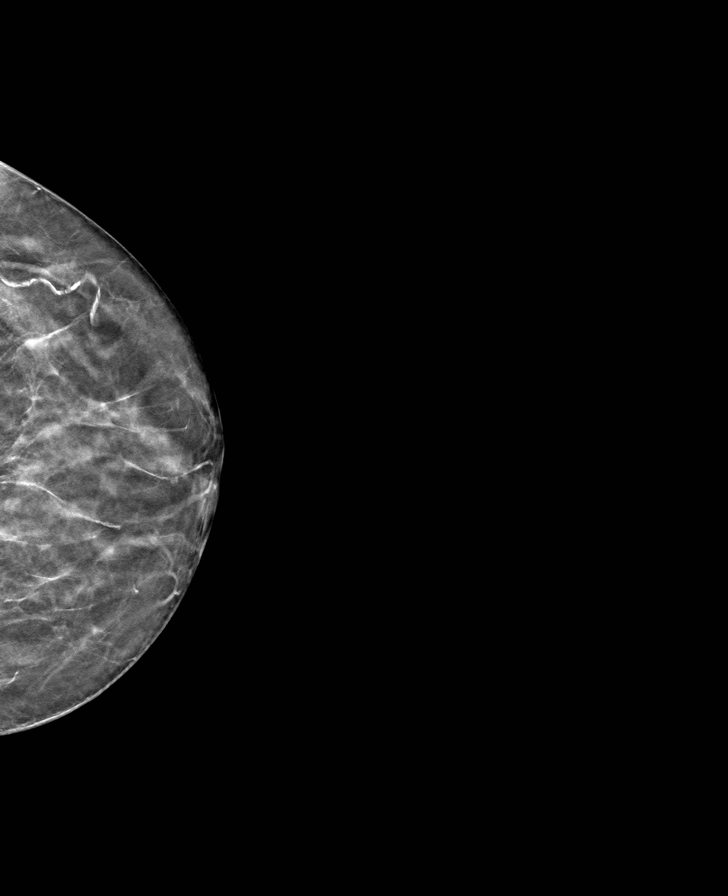

[R MLO tomo · tomo slice 29/58.0]
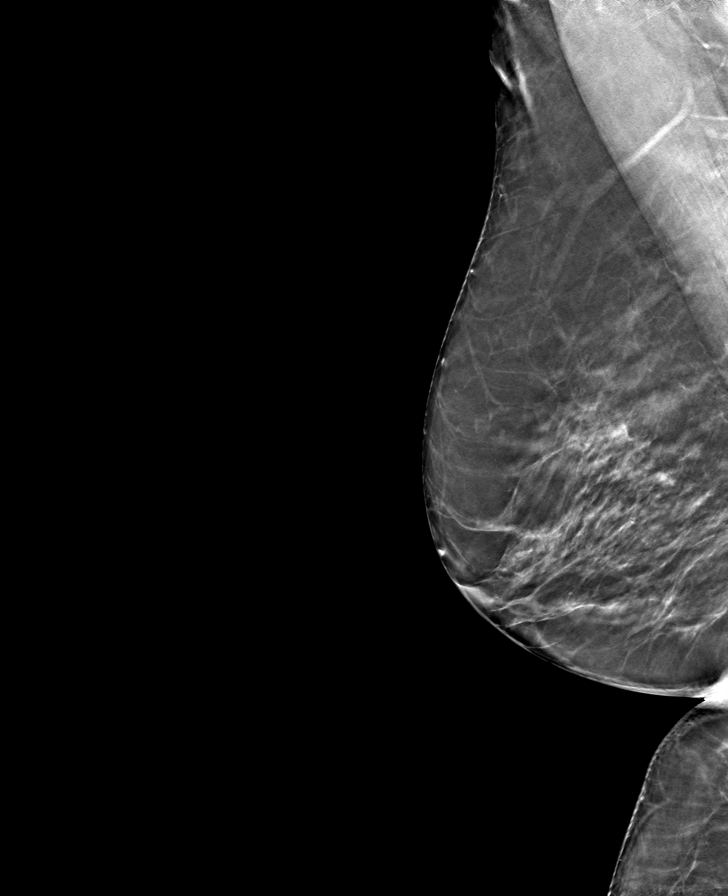

[8 of 24 positions shown; findings below may reference images not displayed]

ACR Breast Density Category b: There are scattered areas of
fibroglandular density.
FINDINGS: There are no findings suspicious for malignancy.
IMPRESSION: No mammographic evidence of malignancy. A result letter of this
screening mammogram will be mailed directly to the patient.

RECOMMENDATION:
Screening mammogram in one year. (Code:51-O-LD2)

BI-RADS CATEGORY  1: Negative.

## 2022-05-22 ENCOUNTER — Telehealth: Payer: Self-pay

## 2022-05-22 NOTE — Telephone Encounter (Signed)
I think it is fine for her to space her mammograms to every 2 years.

## 2022-05-22 NOTE — Telephone Encounter (Signed)
Pt calling to inquire since she is only receiving B&Ps q other year now, should she still be getting her screening mammograms on a yearly basis or can she spread those out as well? Please advise.

## 2022-05-23 NOTE — Telephone Encounter (Signed)
Pt notified and voiced understanding. Will close encounter.  

## 2022-05-27 ENCOUNTER — Encounter: Payer: Self-pay | Admitting: Obstetrics and Gynecology

## 2022-05-27 DIAGNOSIS — Z0000 Encounter for general adult medical examination without abnormal findings: Secondary | ICD-10-CM

## 2023-03-04 ENCOUNTER — Other Ambulatory Visit: Payer: Self-pay | Admitting: Obstetrics and Gynecology

## 2023-03-04 DIAGNOSIS — Z1231 Encounter for screening mammogram for malignant neoplasm of breast: Secondary | ICD-10-CM

## 2023-04-08 ENCOUNTER — Ambulatory Visit
Admission: RE | Admit: 2023-04-08 | Discharge: 2023-04-08 | Disposition: A | Discharge status: Home / Self Care | Payer: Medicare Other | Source: Ambulatory Visit | Attending: Obstetrics and Gynecology

## 2023-04-08 DIAGNOSIS — Z1231 Encounter for screening mammogram for malignant neoplasm of breast: Secondary | ICD-10-CM

## 2023-04-10 ENCOUNTER — Encounter: Payer: Self-pay | Admitting: Obstetrics and Gynecology

## 2023-04-11 ENCOUNTER — Other Ambulatory Visit: Payer: Self-pay | Admitting: Obstetrics and Gynecology

## 2023-04-11 DIAGNOSIS — R928 Other abnormal and inconclusive findings on diagnostic imaging of breast: Secondary | ICD-10-CM

## 2023-04-28 ENCOUNTER — Encounter: Payer: Self-pay | Admitting: Obstetrics and Gynecology

## 2023-04-28 ENCOUNTER — Other Ambulatory Visit: Payer: Self-pay | Admitting: Obstetrics and Gynecology

## 2023-04-28 ENCOUNTER — Ambulatory Visit
Admission: RE | Admit: 2023-04-28 | Discharge: 2023-04-28 | Disposition: A | Discharge status: Home / Self Care | Source: Ambulatory Visit | Attending: Obstetrics and Gynecology | Admitting: Obstetrics and Gynecology

## 2023-04-28 ENCOUNTER — Ambulatory Visit
Admission: RE | Admit: 2023-04-28 | Discharge: 2023-04-28 | Disposition: A | Discharge status: Home / Self Care | Source: Ambulatory Visit | Attending: Obstetrics and Gynecology

## 2023-04-28 DIAGNOSIS — N6310 Unspecified lump in the right breast, unspecified quadrant: Secondary | ICD-10-CM

## 2023-04-28 DIAGNOSIS — R928 Other abnormal and inconclusive findings on diagnostic imaging of breast: Secondary | ICD-10-CM

## 2023-04-29 ENCOUNTER — Ambulatory Visit
Admission: RE | Admit: 2023-04-29 | Discharge: 2023-04-29 | Disposition: A | Discharge status: Home / Self Care | Source: Ambulatory Visit | Attending: Obstetrics and Gynecology | Admitting: Obstetrics and Gynecology

## 2023-04-29 ENCOUNTER — Other Ambulatory Visit: Payer: Self-pay | Admitting: Diagnostic Radiology

## 2023-04-29 DIAGNOSIS — N6310 Unspecified lump in the right breast, unspecified quadrant: Secondary | ICD-10-CM

## 2023-04-29 HISTORY — PX: BREAST BIOPSY: SHX20

## 2023-04-30 ENCOUNTER — Encounter: Payer: Self-pay | Admitting: Obstetrics and Gynecology

## 2023-04-30 ENCOUNTER — Other Ambulatory Visit (HOSPITAL_COMMUNITY)
Admission: RE | Admit: 2023-04-30 | Discharge: 2023-04-30 | Disposition: A | Discharge status: Home / Self Care | Source: Ambulatory Visit | Attending: Obstetrics and Gynecology | Admitting: Obstetrics and Gynecology

## 2023-04-30 ENCOUNTER — Ambulatory Visit (INDEPENDENT_AMBULATORY_CARE_PROVIDER_SITE_OTHER): Payer: Medicare Other | Admitting: Obstetrics and Gynecology

## 2023-04-30 VITALS — BP 114/70 | HR 72 | Ht 62.25 in | Wt 173.0 lb

## 2023-04-30 DIAGNOSIS — Z9189 Other specified personal risk factors, not elsewhere classified: Secondary | ICD-10-CM | POA: Diagnosis present

## 2023-04-30 DIAGNOSIS — M810 Age-related osteoporosis without current pathological fracture: Secondary | ICD-10-CM | POA: Diagnosis not present

## 2023-04-30 DIAGNOSIS — C50011 Malignant neoplasm of nipple and areola, right female breast: Secondary | ICD-10-CM

## 2023-04-30 DIAGNOSIS — N3281 Overactive bladder: Secondary | ICD-10-CM | POA: Diagnosis not present

## 2023-04-30 DIAGNOSIS — Z124 Encounter for screening for malignant neoplasm of cervix: Secondary | ICD-10-CM

## 2023-04-30 DIAGNOSIS — N8110 Cystocele, unspecified: Secondary | ICD-10-CM | POA: Diagnosis not present

## 2023-04-30 DIAGNOSIS — N952 Postmenopausal atrophic vaginitis: Secondary | ICD-10-CM

## 2023-04-30 LAB — SURGICAL PATHOLOGY

## 2023-04-30 NOTE — Addendum Note (Signed)
 Addended by: Earley Favor on: 04/30/2023 04:44 PM   Modules accepted: Orders

## 2023-04-30 NOTE — Progress Notes (Signed)
 83 y.o. y.o. female here for medicare gyn exam. Patient's last menstrual period was 01/21/1977 (approximate).    S/p breast biopsy was told high likelihood of cancer. Referral to general surgery placed. 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  No vaginal bleeding. Married for 58 years. Not sexually active.   No bowel or bladder c/o.     Patient's last menstrual period was 01/21/1977 (approximate).          Sexually active: No.  The current method of family planning is post menopausal status.    Exercising: Yes.    Tennis and swimming the gym  Smoker:  no No HRT use. Denies any PMB, pelvic pain or discharge Health Maintenance: Pap:  10-30-16 neg. 10/12/2014 negative History of abnormal Pap:  no MMG:  04/04/21 density B Bi-rads 1 neg 2025 with mass s/p biopsy in right breast BMD:  reports recent with worsening osteopenia started fosamax with her PMD x 1 year 2024.  To get records and repeat dxa. Having some reflux on the medication. Counseled on evenity/prolia and she would like to start. Labs done today. Has high risk for fall. Colonoscopy: 2017 normal  TDaP:   05/31/19 Gardasil: n/a Vaginal prolapse and OAB: referral to urogyn placed  Body mass index is 31.39 kg/m.      No data to display          Height 5' 2.25" (1.581 m), weight 173 lb (78.5 kg), last menstrual period 01/21/1977.     Component Value Date/Time   DIAGPAP  10/30/2016 0000    NEGATIVE FOR INTRAEPITHELIAL LESIONS OR MALIGNANCY.   ADEQPAP  10/30/2016 0000    Satisfactory for evaluation  endocervical/transformation zone component PRESENT.    GYN HISTORY:    Component Value Date/Time   DIAGPAP  10/30/2016 0000    NEGATIVE FOR INTRAEPITHELIAL LESIONS OR MALIGNANCY.   ADEQPAP  10/30/2016 0000    Satisfactory for evaluation  endocervical/transformation zone component  PRESENT.    OB History  Gravida Para Term Preterm AB Living  4 4 4  0 0 4  SAB IAB Ectopic Multiple Live Births  0 0 0 0 4    # Outcome Date GA Lbr Len/2nd Weight Sex Type Anes PTL Lv  4 Term 51    M Vag-Spont   LIV  3 Term 70    F Vag-Spont   LIV  2 Term 39    M Vag-Spont   LIV  1 Term 77    F Vag-Spont   LIV    Past Medical History:  Diagnosis Date   Arthritis    "primarily in my knee & thumbs" (10/15/2014)   Hypercholesterolemia    Hypertension    Melanoma of lower leg (HCC) 05/2003   "left"   Osteopenia    Seasonal allergies    Squamous carcinoma    "all on the left side:  under eye, nostril, bicept"    Past Surgical History:  Procedure Laterality Date   BREAST BIOPSY Right 04/29/2023   [place] RT BREAST BX W LOC DEV 1ST LESION IMG BX SPEC US GUIDE 04/29/2023 [facility] MAMMOGRAPHY   cataract Bilateral 05/21/2017   COLONOSCOPY     DILATION AND CURETTAGE OF UTERUS  1968   JOINT REPLACEMENT Bilateral    knee-     KNEE ARTHROSCOPY Left 05/06/2012   torn miniscus   MELANOMA EXCISION  Left 05/2003   leg   MOHS SURGERY Left 2000's   "squamous; under eye and nostril"   SQUAMOUS CELL CARCINOMA EXCISION Left 2008   upper bicept   TONSILLECTOMY AND ADENOIDECTOMY  1949   TOTAL KNEE ARTHROPLASTY Left 10/14/2014   TOTAL KNEE ARTHROPLASTY Left 10/14/2014   Procedure: TOTAL KNEE ARTHROPLASTY;  Surgeon: Frederico Hamman, MD;  Location: [facility] OR;  Service: Orthopedics;  Laterality: Left;   TOTAL KNEE ARTHROPLASTY Right 02/10/2015   Procedure: RIGHT TOTAL KNEE ARTHROPLASTY;  Surgeon: Frederico Hamman, MD;  Location: [facility] OR;  Service: Orthopedics;  Laterality: Right;   TOTAL SHOULDER ARTHROPLASTY Right 08/13/2016   Procedure: TOTAL SHOULDER ARTHROPLASTY;  Surgeon: Sheral Apley, MD;  Location: [facility] OR;  Service: Orthopedics;  Laterality: Right;   TOTAL SHOULDER ARTHROPLASTY Left 03/18/2017   Procedure: TOTAL SHOULDER ARTHROPLASTY;  Surgeon: Sheral Apley, MD;  Location: [facility] OR;  Service:  Orthopedics;  Laterality: Left;    Current Outpatient Medications on File Prior to Visit  Medication Sig Dispense Refill   acetaminophen (TYLENOL) 500 MG tablet Take 1,000 mg by mouth every 6 (six) hours as needed.     alendronate (FOSAMAX) 70 MG tablet Take by mouth.     aspirin EC 81 MG tablet Take 81 mg by mouth daily.     atorvastatin (LIPITOR) 10 MG tablet Take 10 mg by mouth every evening.      Calcium Carbonate-Vitamin D (CALCIUM 600+D PO) Take 1 tablet by mouth daily.     cholecalciferol (VITAMIN D) 1000 UNITS tablet Take 1,000 Units by mouth daily.      Docusate Calcium (STOOL SOFTENER PO) Take by mouth.     famotidine (PEPCID) 20 MG tablet Take 20 mg by mouth daily.     Hypromellose (ARTIFICIAL TEARS OP) Apply 1 drop to eye 4 (four) times daily as needed (dry eyes).     ketotifen (ZADITOR) 0.025 % ophthalmic solution Place 1 drop into both eyes 2 (two) times daily as needed (allergies).     loratadine (CLARITIN) 10 MG tablet Take 10 mg by mouth daily as needed (seasonal allergies).      losartan (COZAAR) 50 MG tablet Take 50 mg by mouth daily.     meloxicam (MOBIC) 7.5 MG tablet Take 15 mg by mouth daily as needed.     Multiple Vitamin (MULTIVITAMIN WITH MINERALS) TABS tablet Take 1 tablet by mouth daily.     Probiotic Product (PROBIOTIC PO) Take by mouth every other day.     vitamin C (ASCORBIC ACID) 500 MG tablet Take 500 mg by mouth daily.      No current facility-administered medications on file prior to visit.    Social History   Socioeconomic History   Marital status: Married    Spouse name: Not on file   Number of children: Not on file   Years of education: Not on file   Highest education level: Not on file  Occupational History   Not on file  Tobacco Use   Smoking status: Former    Current packs/day: 0.00    Average packs/day: 0.5 packs/day for 22.0 years (11.0 ttl pk-yrs)    Types: Cigarettes    Start date: 06/21/1957    Quit date: 06/22/1979    Years since  quitting: 43.8   Smokeless tobacco: Never  Vaping Use   Vaping status: Never Used  Substance and Sexual Activity   Alcohol use: Yes    Alcohol/week: 7.0 standard drinks of alcohol    Types: 7  Glasses of wine per week   Drug use: No   Sexual activity: Not Currently    Partners: Male    Birth control/protection: Post-menopausal    Comment: less than 5, older than 16  Other Topics Concern   Not on file  Social History Narrative   Not on file   Social Drivers of Health   Financial Resource Strain: Not on file  Food Insecurity: Not on file  Transportation Needs: Not on file  Physical Activity: Not on file  Stress: Not on file  Social Connections: Not on file  Intimate Partner Violence: Not on file    Family History  Problem Relation Age of Onset   Hypertension Mother    Hypertension Sister 60   Peripheral Artery Disease Sister    Hypertension Maternal Grandfather    Colon cancer Neg Hx    Breast cancer Neg Hx    BRCA 1/2 Neg Hx      No Known Allergies    Patient's last menstrual period was Patient's last menstrual period was 01/21/1977 (approximate)..             Review of Systems Alls systems reviewed and are negative.     Physical Exam Constitutional:      Appearance: Normal appearance.     Comments: Right breast biopsy with no erythema. No breast exam today. Inspection done to monitor for infection  Genitourinary:     No lesions in the vagina.     No vaginal discharge or bleeding.     Apical vaginal prolapse present.    Moderate vaginal atrophy present.     Right Adnexa: no mass present.    Left Adnexa: no mass present.    No cervical discharge.     Uterus is not enlarged, tender or irregular.  Musculoskeletal:     Cervical back: Normal range of motion.  Neurological:     Mental Status: She is alert and oriented to person, place, and time.  Psychiatric:        Mood and Affect: Mood normal.        Behavior: Behavior normal.  Vitals and nursing  note reviewed.   Joy, CNMA was present for exam    A:         Medicare gyn high risk with likely new breast cancer diagnosis desires pap smear                             P:        Pap smear collected today per patient request with concern of breast cancer Encouraged annual mammogram screening and consult with breast surgeon. Referral placed Colon cancer screening up-to-date DXA ordered today Labs: see orders. To get gfr, vit D and calcium if approved for evenity/prolia. Having reflux on fosamax Discussed breast self exams Encouraged healthy lifestyle practices Encouraged Vit D and Calcium   No follow-ups on file.  Earley Favor

## 2023-05-01 ENCOUNTER — Telehealth: Payer: Self-pay | Admitting: *Deleted

## 2023-05-01 ENCOUNTER — Encounter: Payer: Self-pay | Admitting: Obstetrics and Gynecology

## 2023-05-01 NOTE — Telephone Encounter (Signed)
 Ashley Cooke -can you assist with records?

## 2023-05-01 NOTE — Telephone Encounter (Signed)
-----   Message from [place] L sent at 05/01/2023  8:45 AM EDT ----- Regarding: Patient called this morning Patient called the office this morning and said Dr Karma Greaser wanted to know who did her bone density. It was done at [facility] on [address] by Dr Wylene Simmer.   Thank you, Marchelle Folks

## 2023-05-02 ENCOUNTER — Encounter: Payer: Self-pay | Admitting: Obstetrics and Gynecology

## 2023-05-02 ENCOUNTER — Ambulatory Visit: Payer: Self-pay | Admitting: Surgery

## 2023-05-02 DIAGNOSIS — C50911 Malignant neoplasm of unspecified site of right female breast: Secondary | ICD-10-CM

## 2023-05-05 LAB — COMPREHENSIVE METABOLIC PANEL WITH GFR
AG Ratio: 1.9 (calc) (ref 1.0–2.5)
ALT: 26 U/L (ref 6–29)
AST: 26 U/L (ref 10–35)
Albumin: 4.5 g/dL (ref 3.6–5.1)
Alkaline phosphatase (APISO): 77 U/L (ref 37–153)
BUN/Creatinine Ratio: 17 (calc) (ref 6–22)
BUN: 19 mg/dL (ref 7–25)
CO2: 20 mmol/L (ref 20–32)
Calcium: 10.2 mg/dL (ref 8.6–10.4)
Chloride: 103 mmol/L (ref 98–110)
Creat: 1.1 mg/dL — ABNORMAL HIGH (ref 0.60–0.95)
Globulin: 2.4 g/dL (ref 1.9–3.7)
Glucose, Bld: 91 mg/dL (ref 65–99)
Potassium: 4.7 mmol/L (ref 3.5–5.3)
Sodium: 140 mmol/L (ref 135–146)
Total Bilirubin: 0.3 mg/dL (ref 0.2–1.2)
Total Protein: 6.9 g/dL (ref 6.1–8.1)
eGFR: 50 mL/min/{1.73_m2} — ABNORMAL LOW (ref >=60)

## 2023-05-05 LAB — COMPLETE METABOLIC PANEL WITHOUT GFR
AG Ratio: 2.1 (calc) (ref 1.0–2.5)
ALT: 29 U/L (ref 6–29)
AST: 29 U/L (ref 10–35)
Albumin: 4.6 g/dL (ref 3.6–5.1)
Alkaline phosphatase (APISO): 73 U/L (ref 37–153)
BUN/Creatinine Ratio: 18 (calc) (ref 6–22)
BUN: 19 mg/dL (ref 7–25)
CO2: 27 mmol/L (ref 20–32)
Calcium: 9.7 mg/dL (ref 8.6–10.4)
Chloride: 101 mmol/L (ref 98–110)
Creat: 1.05 mg/dL — ABNORMAL HIGH (ref 0.60–0.95)
Globulin: 2.2 g/dL (ref 1.9–3.7)
Glucose, Bld: 95 mg/dL (ref 65–99)
Potassium: 4.5 mmol/L (ref 3.5–5.3)
Sodium: 136 mmol/L (ref 135–146)
Total Bilirubin: 0.7 mg/dL (ref 0.2–1.2)
Total Protein: 6.8 g/dL (ref 6.1–8.1)

## 2023-05-05 LAB — VITAMIN D 25 HYDROXY (VIT D DEFICIENCY, FRACTURES): Vit D, 25-Hydroxy: 69 ng/mL (ref 30–100)

## 2023-05-05 LAB — CYTOLOGY - PAP: Diagnosis: NEGATIVE

## 2023-05-06 NOTE — Progress Notes (Signed)
 Radiation Oncology         [phone] ________________________________  Initial {In/Out}patient Consultation  Name: Ashley Cooke MRN: [id number]  Date: 05/08/2023  DOB: 04-09-1940  UX:[id number], Ashley Pfeiffer, MD  Ashley Dryer, MD   REFERRING PHYSICIAN: Sim Dryer, MD  DIAGNOSIS: There were no encounter diagnoses.  stage 1, right breast cancer ER/PR +; Her2 - (CMS/HHS-HCC)    HISTORY OF PRESENT ILLNESS::Ashley Cooke is a 83 y.o. female who is accompanied by ***. she is seen as a courtesy of Dr. Sim Cooke for an opinion concerning radiation therapy as part of management for her recently diagnosed breast cancer.   The patient presented to a routine bilateral screening mammogram on 04/08/23 which recommended further evaluation of a possible mass in the right breast. Therefore, she underwent a bilateral diagnostic mammogram on 04/28/23 showing a hypoechoic mass with slight irregular shaped and somewhat irregular and indistinct borders over the 2:30 position of the right breast 4 cm from the nipple measuring 7 x 7 x 7 mm with single associated microcalcification present.  Subsequently, she underwent a needle core biopsy of right breast on 04/29/23. Surgical pathology indicated grade 2 invasive ductal carcinoma with tumor size of 0.6 cm. Lymphovascular invasion or calcifications not identified. Estrogen Receptor:  100%, positive, strong staining intensity; progesterone receptor:  100%, positive, strong staining intensity; Proliferation Marker Ki67: 15%; Her2: negative (1+).    In light if findings, she was referred to Dr. Afton Cooke on 05/02/23. Upon discussion patient decided to proceed with right breast lumpectomy which is scheduled for 05/13/23.  PREVIOUS RADIATION THERAPY: No  PAST MEDICAL HISTORY:  Past Medical History:  Diagnosis Date   Arthritis    "primarily in my knee & thumbs" (10/15/2014)   Hypercholesterolemia    Hypertension    Melanoma of lower leg (HCC) 05/2003   "left"    Osteopenia    Seasonal allergies    Squamous carcinoma    "all on the left side:  under eye, nostril, bicept"    PAST SURGICAL HISTORY: Past Surgical History:  Procedure Laterality Date   BREAST BIOPSY Right 04/29/2023   US  RT BREAST BX W LOC DEV 1ST LESION IMG BX SPEC US  GUIDE 04/29/2023 [facility] MAMMOGRAP[facility]   cataract Bilateral 05/21/2017   COLONOSCOPY     DILATION AND CURETTAGE OF UTERUS  1968   JOINT REPLACEMENT Bilateral    knee-     KNEE ARTHROSCOPY Left 05/06/2012   torn miniscus   MELANOMA EXCISION Left 05/2003   leg   MOHS SURGERY Left 2000's   "squamous; under eye and nostril"   SQUAMOUS CELL CARCINOMA EXCISION Left 2008   upper bicept   TONSILLECTOMY AND ADENOIDECTOMY  1949   TOTAL KNEE ARTHROPLASTY Left 10/14/2014   TOTAL KNEE ARTHROPLASTY Left 10/14/2014   Procedure: TOTAL KNEE ARTHROPLASTY;  Surgeon: Ashley Sima, MD;  Location: [facility] OR;  Service: Orthopedics;  Laterality: Left;   TOTAL KNEE ARTHROPLASTY Right 02/10/2015   Procedure: RIGHT TOTAL KNEE ARTHROPLASTY;  Surgeon: Ashley Sima, MD;  Location: [facility] OR;  Service: Orthopedics;  Laterality: Right;   TOTAL SHOULDER ARTHROPLASTY Right 08/13/2016   Procedure: TOTAL SHOULDER ARTHROPLASTY;  Surgeon: Ashley Curl, MD;  Location: [facility] OR;  Service: Orthopedics;  Laterality: Right;   TOTAL SHOULDER ARTHROPLASTY Left 03/18/2017   Procedure: TOTAL SHOULDER ARTHROPLASTY;  Surgeon: Ashley Curl, MD;  Location: [facility] OR;  Service: Orthopedics;  Laterality: Left;    FAMILY HISTORY:  Family History  Problem Relation Age of Onset  Hypertension Mother    Hypertension Sister 33   Peripheral Artery Disease Sister    Hypertension Maternal Grandfather    Colon cancer Neg Hx    Breast cancer Neg Hx    BRCA 1/2 Neg Hx     SOCIAL HISTORY:  Social History   Tobacco Use   Smoking status: Former    Current packs/day: 0.00    Average packs/day: 0.5 packs/day for 22.0 years (11.0 ttl pk-yrs)    Types: Cigarettes    Start date:  06/21/1957    Quit date: 06/22/1979    Years since quitting: 43.9   Smokeless tobacco: Never  Vaping Use   Vaping status: Never Used  Substance Use Topics   Alcohol use: Yes    Alcohol/week: 7.0 standard drinks of alcohol    Types: 7 Glasses of wine per week   Drug use: No    ALLERGIES: No Known Allergies  MEDICATIONS:  Current Outpatient Medications  Medication Sig Dispense Refill   acetaminophen (TYLENOL) 500 MG tablet Take 1,000 mg by mouth every 6 (six) hours as needed.     alendronate (FOSAMAX) 70 MG tablet Take by mouth.     aspirin EC 81 MG tablet Take 81 mg by mouth daily.     atorvastatin (LIPITOR) 10 MG tablet Take 10 mg by mouth every evening.      Calcium Carbonate-Vitamin D (CALCIUM 600+D PO) Take 1 tablet by mouth daily.     cholecalciferol (VITAMIN D) 1000 UNITS tablet Take 1,000 Units by mouth daily.      Docusate Calcium (STOOL SOFTENER PO) Take by mouth.     famotidine (PEPCID) 20 MG tablet Take 20 mg by mouth daily.     Hypromellose (ARTIFICIAL TEARS OP) Apply 1 drop to eye 4 (four) times daily as needed (dry eyes).     ketotifen (ZADITOR) 0.025 % ophthalmic solution Place 1 drop into both eyes 2 (two) times daily as needed (allergies).     loratadine (CLARITIN) 10 MG tablet Take 10 mg by mouth daily as needed (seasonal allergies).      losartan (COZAAR) 50 MG tablet Take 50 mg by mouth daily.     meloxicam (MOBIC) 7.5 MG tablet Take 15 mg by mouth daily as needed.     Multiple Vitamin (MULTIVITAMIN WITH MINERALS) TABS tablet Take 1 tablet by mouth daily.     Probiotic Product (PROBIOTIC PO) Take by mouth every other day.     vitamin C (ASCORBIC ACID) 500 MG tablet Take 500 mg by mouth daily.      No current facility-administered medications for this encounter.    REVIEW OF SYSTEMS:  A 10+ POINT REVIEW OF SYSTEMS WAS OBTAINED including neurology, dermatology, psychiatry, cardiac, respiratory, lymph, extremities, GI, GU, musculoskeletal, constitutional,  reproductive, HEENT. ***   PHYSICAL EXAM:  vitals were not taken for this visit.   General: Alert and oriented, in no acute distress HEENT: Head is normocephalic. Extraocular movements are intact. Oropharynx is clear. Neck: Neck is supple, no palpable cervical or supraclavicular lymphadenopathy. Heart: Regular in rate and rhythm with no murmurs, rubs, or gallops. Chest: Clear to auscultation bilaterally, with no rhonchi, wheezes, or rales. Abdomen: Soft, nontender, nondistended, with no rigidity or guarding. Extremities: No cyanosis or edema. Lymphatics: see Neck Exam Skin: No concerning lesions. Musculoskeletal: symmetric strength and muscle tone throughout. Neurologic: Cranial nerves II through XII are grossly intact. No obvious focalities. Speech is fluent. Coordination is intact. Psychiatric: Judgment and insight are intact. Affect is appropriate. ***  ECOG = ***  0 - Asymptomatic (Fully active, able to carry on all predisease activities without restriction)  1 - Symptomatic but completely ambulatory (Restricted in physically strenuous activity but ambulatory and able to carry out work of a light or sedentary nature. For example, light housework, office work)  2 - Symptomatic, <50% in bed during the day (Ambulatory and capable of all self care but unable to carry out any work activities. Up and about more than 50% of waking hours)  3 - Symptomatic, >50% in bed, but not bedbound (Capable of only limited self-care, confined to bed or chair 50% or more of waking hours)  4 - Bedbound (Completely disabled. Cannot carry on any self-care. Totally confined to bed or chair)  5 - Death   Santiago Glad MM, Creech RH, Tormey DC, et al. [phone]). "Toxicity and response criteria of the [facility] Group". Am. Evlyn Clines. Oncol. 5 (6): 649-55  LABORATORY DATA:  Lab Results  Component Value Date   WBC 6.5 03/07/2017   HGB 14.5 03/07/2017   HCT 43.4 03/07/2017   MCV 95.6 03/07/2017    PLT 308 03/07/2017   NEUTROABS 4.3 01/31/2015   Lab Results  Component Value Date   NA 136 04/30/2023   NA 140 04/30/2023   K 4.5 04/30/2023   K 4.7 04/30/2023   CL 101 04/30/2023   CL 103 04/30/2023   CO2 27 04/30/2023   CO2 20 04/30/2023   GLUCOSE 95 04/30/2023   GLUCOSE 91 04/30/2023   BUN 19 04/30/2023   BUN 19 04/30/2023   CREATININE 1.05 (H) 04/30/2023   CREATININE 1.10 (H) 04/30/2023   CALCIUM 9.7 04/30/2023   CALCIUM 10.2 04/30/2023      RADIOGRAPHY: [place] RT BREAST BX W LOC DEV 1ST LESION IMG BX SPEC US GUIDE Addendum Date: 05/01/2023 ADDENDUM REPORT: 05/01/2023 15:59 ADDENDUM: PATHOLOGY revealed: 1. Breast, right, needle core biopsy, 2:30 o'clock 4 cmfn: - INVASIVE DUCTAL CARCINOMA, - OVERALL GRADE: 2 - LYMPHOVASCULAR INVASION: NOT IDENTIFIED - CANCER LENGTH: 0.6 CM - CALCIFICATIONS: NOT IDENTIFIED. Pathology results are CONCORDANT with imaging findings, per Dr. Sherron Ales. Pathology results and recommendations were discussed with patient via telephone on 05/01/2023 by Randa Lynn RN. Patient reported biopsy site doing well with no adverse symptoms, and only slight tenderness at the site. Post biopsy care instructions were reviewed, questions were answered and my direct phone number was provided. Patient was instructed to call [facility] for any additional questions or concerns related to biopsy site. RECOMMENDATION:1. Surgical and oncological consultation. Request for surgical consultation relayed to Sheralyn Boatman at [facility] on 05/01/2023 by Randa Lynn RN. Surgical consultation has been arranged for patient to see Dr. Darolyn Rua at [facility] on 05/02/2023 at 10:00. Pathology results reported by Randa Lynn RN on 05/01/2023. Electronically Signed   By: Sherron Ales M.D.   On: 05/01/2023 15:59   Result Date: 05/01/2023 CLINICAL DATA:  Suspicious 0.7 cm mass at the right breast 2:30 position 4 cm from the nipple. EXAM: ULTRASOUND  GUIDED RIGHT BREAST CORE NEEDLE BIOPSY COMPARISON:  Previous exam(s). PROCEDURE: I met with the patient and we discussed the procedure of ultrasound-guided biopsy, including benefits and alternatives. We discussed the high likelihood of a successful procedure. We discussed the risks of the procedure, including infection, bleeding, tissue injury, clip migration, and inadequate sampling. Informed written consent was given. The usual time-out protocol was performed immediately prior to the procedure. Lesion quadrant: Upper inner quadrant Using  sterile technique and 1% Lidocaine as local anesthetic, under direct ultrasound visualization, a 14 gauge spring-loaded device was used to perform biopsy of a 0.7 cm mass at right breast 2:30 position 4 cm from the nipple using an inferior approach. At the conclusion of the procedure a coil shaped tissue marker clip was deployed into the biopsy cavity. Follow up 2 view mammogram was performed and dictated separately. IMPRESSION: Ultrasound guided biopsy of a 0.7 cm mass at the right breast 2:30 position 4 cm from the nipple. No apparent complications. Electronically Signed: By: Sherron Ales M.D. On: 04/29/2023 13:41   MM CLIP PLACEMENT RIGHT Result Date: 04/29/2023 CLINICAL DATA:  Postprocedure mammogram. EXAM: 3D DIAGNOSTIC RIGHT MAMMOGRAM POST ULTRASOUND BIOPSY COMPARISON:  Previous exam(s). ACR Breast Density Category b: There are scattered areas of fibroglandular density. FINDINGS: 3D Mammographic images were obtained following ultrasound guided biopsy of a 0.7 cm mass at the right breast 2:30 position 4 cm from the nipple. The biopsy marking clip is in expected position at the site of biopsy. IMPRESSION: Appropriate positioning of the coil shaped biopsy marking clip at the site of biopsy in the central inner right breast at middle depth. Final Assessment: Post Procedure Mammograms for Marker Placement Electronically Signed   By: Sherron Ales M.D.   On: 04/29/2023 13:40    MM 3D DIAGNOSTIC MAMMOGRAM UNILATERAL RIGHT BREAST Result Date: 04/28/2023 CLINICAL DATA:  Recall from screening to evaluate a possible right breast mass. EXAM: DIGITAL DIAGNOSTIC UNILATERAL RIGHT MAMMOGRAM WITH TOMOSYNTHESIS AND CAD; ULTRASOUND RIGHT BREAST LIMITED TECHNIQUE: Right digital diagnostic mammography and breast tomosynthesis was performed. The images were evaluated with computer-aided detection. ; Targeted ultrasound examination of the right breast was performed COMPARISON:  Previous exam(s). ACR Breast Density Category b: There are scattered areas of fibroglandular density. FINDINGS: Additional views of the right breast demonstrate a somewhat irregular shaped mass with partial irregular partially obscured borders over the inner midportion of the right breast containing a single coarse calcification. Targeted ultrasound is performed, showing a hypoechoic mass with slight irregular shaped and somewhat irregular and indistinct borders over the 2:30 position of the right breast 4 cm from the nipple measuring 7 x 7 x 7 mm. There is a single associated microcalcification present. Ultrasound axilla is within normal. IMPRESSION: Slightly suspicious 7 mm mass over the 2:30 position of the right breast. RECOMMENDATION: Recommend ultrasound-guided core needle biopsy for further evaluation. I have discussed the findings and recommendations with the patient. If applicable, a reminder letter will be sent to the patient regarding the next appointment. BI-RADS CATEGORY  4: Suspicious. Biopsy scheduling will be facilitated by the ultrasonographer. Electronically Signed   By: Elberta Fortis M.D.   On: 04/28/2023 14:42   [place] LIMITED ULTRASOUND INCLUDING AXILLA RIGHT BREAST Result Date: 04/28/2023 CLINICAL DATA:  Recall from screening to evaluate a possible right breast mass. EXAM: DIGITAL DIAGNOSTIC UNILATERAL RIGHT MAMMOGRAM WITH TOMOSYNTHESIS AND CAD; ULTRASOUND RIGHT BREAST LIMITED TECHNIQUE: Right digital  diagnostic mammography and breast tomosynthesis was performed. The images were evaluated with computer-aided detection. ; Targeted ultrasound examination of the right breast was performed COMPARISON:  Previous exam(s). ACR Breast Density Category b: There are scattered areas of fibroglandular density. FINDINGS: Additional views of the right breast demonstrate a somewhat irregular shaped mass with partial irregular partially obscured borders over the inner midportion of the right breast containing a single coarse calcification. Targeted ultrasound is performed, showing a hypoechoic mass with slight irregular shaped and somewhat irregular and indistinct borders over the  2:30 position of the right breast 4 cm from the nipple measuring 7 x 7 x 7 mm. There is a single associated microcalcification present. Ultrasound axilla is within normal. IMPRESSION: Slightly suspicious 7 mm mass over the 2:30 position of the right breast. RECOMMENDATION: Recommend ultrasound-guided core needle biopsy for further evaluation. I have discussed the findings and recommendations with the patient. If applicable, a reminder letter will be sent to the patient regarding the next appointment. BI-RADS CATEGORY  4: Suspicious. Biopsy scheduling will be facilitated by the ultrasonographer. Electronically Signed   By: Roda Cirri M.D.   On: 04/28/2023 14:42   MM 3D SCREENING MAMMOGRAM BILATERAL BREAST Result Date: 04/10/2023 CLINICAL DATA:  Screening. EXAM: DIGITAL SCREENING BILATERAL MAMMOGRAM WITH TOMOSYNTHESIS AND CAD TECHNIQUE: Bilateral screening digital craniocaudal and mediolateral oblique mammograms were obtained. Bilateral screening digital breast tomosynthesis was performed. The images were evaluated with computer-aided detection. COMPARISON:  Previous exam(s). ACR Breast Density Category b: There are scattered areas of fibroglandular density. FINDINGS: In the right breast, a possible mass warrants further evaluation. In the left  breast, no findings suspicious for malignancy. IMPRESSION: Further evaluation is suggested for a possible mass in the right breast. RECOMMENDATION: Diagnostic mammogram and possibly ultrasound of the right breast. (Code:[id number]-R-65M) The patient will be contacted regarding the findings, and additional imaging will be scheduled. BI-RADS CATEGORY  0: Incomplete: Need additional imaging evaluation. Electronically Signed   By: Anna Barnes M.D.   On: 04/10/2023 12:36      IMPRESSION: Breast cancer, stage 1, right (CMS/HHS-HCC)    ***  Today, I talked to the patient and family about the findings and work-up thus far.  We discussed the natural history of *** and general treatment, highlighting the role of radiotherapy in the management.  We discussed the available radiation techniques, and focused on the details of logistics and delivery.  We reviewed the anticipated acute and late sequelae associated with radiation in this setting.  The patient was encouraged to ask questions that I answered to the best of my ability. *** A patient consent form was discussed and signed.  We retained a copy for our records.  The patient would like to proceed with radiation and will be scheduled for CT simulation.  PLAN: ***    *** minutes of total time was spent for this patient encounter, including preparation, face-to-face counseling with the patient and coordination of care, physical exam, and documentation of the encounter.   ------------------------------------------------  Noralee Beam, PhD, MD  This document serves as a record of services personally performed by Retta Caster, MD. It was created on his behalf by Lucky Sable, a trained medical scribe. The creation of this record is based on the scribe's personal observations and the provider's statements to them. This document has been checked and approved by the attending provider.

## 2023-05-07 ENCOUNTER — Other Ambulatory Visit: Payer: Self-pay | Admitting: Surgery

## 2023-05-07 ENCOUNTER — Other Ambulatory Visit: Payer: Self-pay

## 2023-05-07 ENCOUNTER — Inpatient Hospital Stay: Attending: Hematology and Oncology | Admitting: Hematology and Oncology

## 2023-05-07 ENCOUNTER — Encounter (HOSPITAL_BASED_OUTPATIENT_CLINIC_OR_DEPARTMENT_OTHER): Payer: Self-pay | Admitting: Surgery

## 2023-05-07 ENCOUNTER — Encounter (HOSPITAL_BASED_OUTPATIENT_CLINIC_OR_DEPARTMENT_OTHER)
Admission: RE | Admit: 2023-05-07 | Discharge: 2023-05-07 | Disposition: A | Discharge status: Home / Self Care | Source: Ambulatory Visit | Attending: Surgery | Admitting: Surgery

## 2023-05-07 VITALS — BP 164/77 | HR 78 | Temp 97.7°F | Resp 16 | Wt 171.0 lb

## 2023-05-07 DIAGNOSIS — Z170 Estrogen receptor positive status [ER+]: Secondary | ICD-10-CM | POA: Diagnosis not present

## 2023-05-07 DIAGNOSIS — Z01812 Encounter for preprocedural laboratory examination: Secondary | ICD-10-CM | POA: Insufficient documentation

## 2023-05-07 DIAGNOSIS — Z85820 Personal history of malignant melanoma of skin: Secondary | ICD-10-CM | POA: Insufficient documentation

## 2023-05-07 DIAGNOSIS — C50911 Malignant neoplasm of unspecified site of right female breast: Secondary | ICD-10-CM

## 2023-05-07 DIAGNOSIS — Z1732 Human epidermal growth factor receptor 2 negative status: Secondary | ICD-10-CM | POA: Insufficient documentation

## 2023-05-07 DIAGNOSIS — Z87891 Personal history of nicotine dependence: Secondary | ICD-10-CM | POA: Diagnosis not present

## 2023-05-07 DIAGNOSIS — Z1721 Progesterone receptor positive status: Secondary | ICD-10-CM | POA: Insufficient documentation

## 2023-05-07 DIAGNOSIS — M8580 Other specified disorders of bone density and structure, unspecified site: Secondary | ICD-10-CM | POA: Insufficient documentation

## 2023-05-07 DIAGNOSIS — C50311 Malignant neoplasm of lower-inner quadrant of right female breast: Secondary | ICD-10-CM | POA: Insufficient documentation

## 2023-05-07 DIAGNOSIS — C50319 Malignant neoplasm of lower-inner quadrant of unspecified female breast: Secondary | ICD-10-CM | POA: Insufficient documentation

## 2023-05-07 MED ORDER — CHLORHEXIDINE GLUCONATE CLOTH 2 % EX PADS: 6.0000 | MEDICATED_PAD | Freq: Once | CUTANEOUS | Status: DC

## 2023-05-07 NOTE — Progress Notes (Signed)
 Location of Breast Cancer:Right Side  Histology per Pathology Report:   Receptor Status: ER(positive), PR (positive), Her2-neu (negative), Ki-67(of 15%)  Did patient present with symptoms (if so, please note symptoms) or was this found on screening mammography?:   Past/Anticipated interventions by surgeon, if any:{t:21944} ***  Past/Anticipated interventions by medical oncology, if any:   Lymphedema issues, if any:  {:18581} {t:21944}   Pain issues, if any:  {:18581} {PAIN DESCRIPTION:21022940}  SAFETY ISSUES: Prior radiation? {:18581} Pacemaker/ICD? {:18581} Possible current pregnancy?{:18581} Is the patient on methotrexate? {:18581}  Current Complaints / other details:  ***

## 2023-05-07 NOTE — Progress Notes (Signed)

## 2023-05-07 NOTE — Progress Notes (Signed)
 [facility] CONSULT NOTE  Patient Care Team: Tisovec, Adelfa Koh, MD as PCP - General (Internal Medicine)  CHIEF COMPLAINTS/PURPOSE OF CONSULTATION:  Newly diagnosed breast cancer  HISTORY OF PRESENTING ILLNESS:  Ashley Cooke 83 y.o. female is here because of recent diagnosis of right breast IDC  I reviewed her records extensively and collaborated the history with the patient.  SUMMARY OF ONCOLOGIC HISTORY: Oncology History  Malignant neoplasm of lower-inner quadrant of female breast (HCC)  04/28/2023 Mammogram   Screening and diagnostic mammogram showed slightly suspicious 7 mm mass over the 2:30 position of the right breast.   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)   05/07/2023 Cancer Staging   Staging form: Breast, AJCC 8th Edition - Clinical stage from 05/07/2023: Stage IA (cT1b, cN0, cM0, G2, ER+, PR+, HER2-) - Signed by Rachel Moulds, MD on 05/07/2023 Stage prefix: Initial diagnosis Histologic grading system: 3 grade system    Discussed the use of AI scribe software for clinical note transcription with the patient, who gave verbal consent to proceed.  History of Present Illness Ashley Cooke is an 83 year old female with breast cancer who presents for a consultation regarding antiestrogen therapy.  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 The cancer is strongly estrogen and progesterone receptor positive.  She has a history of osteopenia and is currently taking Fosamax and Vitamin D 1000 mg daily. She has been on Fosamax for a year. No history of osteoporosis.  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 She also has a history of bilateral knee and shoulder replacements.  She has no family history of breast cancer. She has four children who are not residing locally.  She was on hormone replacement therapy for approximately 20-30 years starting at age 18, which was recommended to maintain bone density. She has been menopausal for 43 years but still produces a small amount of estrogen.  No history of blood clots or major heart problems. She still has her uterus and has not had a hysterectomy.  Rest of the pertinent 10 point ROS reviewed and neg.  MEDICAL HISTORY:  Past Medical History:  Diagnosis Date   Arthritis    "primarily in my knee & thumbs" (10/15/2014)   Hypercholesterolemia    Hypertension    Melanoma of lower leg (HCC) 05/2003   "left"   Osteopenia    Seasonal allergies    Squamous carcinoma    "all on the left side:  under eye, nostril, bicept"    SURGICAL HISTORY: Past Surgical History:  Procedure Laterality Date   BREAST BIOPSY Right 04/29/2023   [place] RT BREAST BX W LOC DEV 1ST LESION IMG BX SPEC US GUIDE 04/29/2023 [facility] MAMMOGRAPHY   cataract Bilateral 05/21/2017   COLONOSCOPY     DILATION AND CURETTAGE OF UTERUS  1968   JOINT REPLACEMENT Bilateral    knee-     KNEE ARTHROSCOPY Left 05/06/2012   torn miniscus   MELANOMA EXCISION Left 05/2003   leg   MOHS SURGERY Left 2000's   "  squamous; under eye and nostril"   SQUAMOUS CELL CARCINOMA EXCISION Left 2008   upper bicept   TONSILLECTOMY AND ADENOIDECTOMY  1949   TOTAL KNEE ARTHROPLASTY Left 10/14/2014   TOTAL KNEE ARTHROPLASTY Left 10/14/2014   Procedure: TOTAL KNEE ARTHROPLASTY;  Surgeon: Frederico Hamman, MD;  Location: [facility] OR;  Service: Orthopedics;  Laterality: Left;   TOTAL KNEE ARTHROPLASTY Right 02/10/2015   Procedure: RIGHT TOTAL KNEE ARTHROPLASTY;  Surgeon: Frederico Hamman,  MD;  Location: [facility] OR;  Service: Orthopedics;  Laterality: Right;   TOTAL SHOULDER ARTHROPLASTY Right 08/13/2016   Procedure: TOTAL SHOULDER ARTHROPLASTY;  Surgeon: Sheral Apley, MD;  Location: [facility] OR;  Service: Orthopedics;  Laterality: Right;   TOTAL SHOULDER ARTHROPLASTY Left 03/18/2017   Procedure: TOTAL SHOULDER ARTHROPLASTY;  Surgeon: Sheral Apley, MD;  Location: [facility] OR;  Service: Orthopedics;  Laterality: Left;    SOCIAL HISTORY: Social History   Socioeconomic History   Marital status: Married    Spouse name: Not on file   Number of children: Not on file   Years of education: Not on file   Highest education level: Not on file  Occupational History   Not on file  Tobacco Use   Smoking status: Former    Current packs/day: 0.00    Average packs/day: 0.5 packs/day for 22.0 years (11.0 ttl pk-yrs)    Types: Cigarettes    Start date: 06/21/1957    Quit date: 06/22/1979    Years since quitting: 43.9   Smokeless tobacco: Never  Vaping Use   Vaping status: Never Used  Substance and Sexual Activity   Alcohol use: Yes    Alcohol/week: 7.0 standard drinks of alcohol    Types: 7 Glasses of wine per week   Drug use: No   Sexual activity: Not Currently    Partners: Male    Birth control/protection: Post-menopausal    Comment: less than 5, older than 15  Other Topics Concern   Not on file  Social History Narrative   Not on file   Social Drivers of Health   Financial Resource Strain: Not on file  Food Insecurity: Not on file  Transportation Needs: Not on file  Physical Activity: Not on file  Stress: Not on file  Social Connections: Not on file  Intimate Partner Violence: Not on file    FAMILY HISTORY: Family History  Problem Relation Age of Onset   Hypertension Mother    Hypertension Sister 78   Peripheral Artery Disease Sister    Hypertension Maternal Grandfather    Colon cancer Neg Hx    Breast cancer Neg Hx    BRCA 1/2 Neg Hx     ALLERGIES:  has no known  allergies.  MEDICATIONS:  Current Outpatient Medications  Medication Sig Dispense Refill   acetaminophen (TYLENOL) 500 MG tablet Take 1,000 mg by mouth every 6 (six) hours as needed.     alendronate (FOSAMAX) 70 MG tablet Take by mouth.     aspirin EC 81 MG tablet Take 81 mg by mouth daily.     atorvastatin (LIPITOR) 10 MG tablet Take 10 mg by mouth every evening.      Calcium Carbonate-Vitamin D (CALCIUM 600+D PO) Take 1 tablet by mouth daily.     cholecalciferol (VITAMIN D) 1000 UNITS tablet Take 1,000 Units by mouth daily.      Docusate Calcium (STOOL SOFTENER PO) Take by mouth.     famotidine (PEPCID) 20 MG tablet Take 20 mg by mouth daily.  Hypromellose (ARTIFICIAL TEARS OP) Apply 1 drop to eye 4 (four) times daily as needed (dry eyes).     ketotifen (ZADITOR) 0.025 % ophthalmic solution Place 1 drop into both eyes 2 (two) times daily as needed (allergies).     loratadine (CLARITIN) 10 MG tablet Take 10 mg by mouth daily as needed (seasonal allergies).      losartan (COZAAR) 50 MG tablet Take 50 mg by mouth daily.     meloxicam (MOBIC) 7.5 MG tablet Take 15 mg by mouth daily as needed.     Multiple Vitamin (MULTIVITAMIN WITH MINERALS) TABS tablet Take 1 tablet by mouth daily.     Probiotic Product (PROBIOTIC PO) Take by mouth every other day.     vitamin C (ASCORBIC ACID) 500 MG tablet Take 500 mg by mouth daily.      No current facility-administered medications for this visit.    REVIEW OF SYSTEMS:   Constitutional: Denies fevers, chills or abnormal night sweats Eyes: Denies blurriness of vision, double vision or watery eyes Ears, nose, mouth, throat, and face: Denies mucositis or sore throat Respiratory: Denies cough, dyspnea or wheezes Cardiovascular: Denies palpitation, chest discomfort or lower extremity swelling Gastrointestinal:  Denies nausea, heartburn or change in bowel habits Skin: Denies abnormal skin rashes Lymphatics: Denies new lymphadenopathy or easy  bruising Neurological:Denies numbness, tingling or new weaknesses Behavioral/Psych: Mood is stable, no new changes  Breast: Denies any palpable lumps or discharge All other systems were reviewed with the patient and are negative.  PHYSICAL EXAMINATION: ECOG PERFORMANCE STATUS: 0 - Asymptomatic  Vitals:   05/07/23 1151  BP: (!) 164/77  Pulse: 78  Resp: 16  Temp: 97.7 F (36.5 C)  SpO2: 100%   Filed Weights   05/07/23 1151  Weight: 171 lb (77.6 kg)    GENERAL:alert, no distress and comfortable BREAST: post biopsy changes noted in the right breast. No palpable masses left breast   LABORATORY DATA:  I have reviewed the data as listed Lab Results  Component Value Date   WBC 6.5 03/07/2017   HGB 14.5 03/07/2017   HCT 43.4 03/07/2017   MCV 95.6 03/07/2017   PLT 308 03/07/2017   Lab Results  Component Value Date   NA 136 04/30/2023   NA 140 04/30/2023   K 4.5 04/30/2023   K 4.7 04/30/2023   CL 101 04/30/2023   CL 103 04/30/2023   CO2 27 04/30/2023   CO2 20 04/30/2023    RADIOGRAPHIC STUDIES: I have personally reviewed the radiological reports and agreed with the findings in the report.  ASSESSMENT AND PLAN:  Malignant neoplasm of lower-inner quadrant of female breast [facility]) This is a very pleasant 82 year old postmenopausal female patient with newly diagnosed right breast cancer at 2:30, invasive ductal carcinoma, ER/PR positive strong staining 100% HER2 1+ and Ki-67 15% referred to medical oncology for additional recommendations. Assessment & Plan Invasive ductal carcinoma of the right breast 7 mm tumor at 3 o'clock position, strongly ER/PR positive, no metastasis, no chemotherapy or radiation needed. - Recommend antiestrogen therapy post-surgery, either tamoxifen or aromatase inhibitors, for 5 years. - Discuss risks and benefits of tamoxifen: mild hot flashes, vaginal discharge, small risk of blood clots (1 in 4000) and uterine cancer (1 in 5000). - Discuss risks  and benefits of aromatase inhibitors: potential bone density loss, post menopausal symptoms and arthralgias-  - Provide printed information on tamoxifen and aromatase inhibitors. At this time, she is not interested in adj radiation. She will however have a  conversation with rad onc team. - Plan to start antiestrogen therapy 2-4 weeks post-surgery. - Schedule follow-up appointment to decide on antiestrogen therapy on May 6th at 10 AM.  Osteopenia Current Fosamax and vitamin D use. Antiestrogen therapy choice impacts bone density. - Continue Fosamax and vitamin D.  Melanoma Melanoma on left leg excised in 2005, negative sentinel lymph node, no recurrence.  Murleen Arms MD      All questions were answered. The patient knows to call the clinic with any problems, questions or concerns.    Murleen Arms, MD 05/07/23

## 2023-05-07 NOTE — Assessment & Plan Note (Addendum)
 This is a very pleasant 83 year old postmenopausal female patient with newly diagnosed right breast cancer at 2:30, invasive ductal carcinoma, ER/PR positive strong staining 100% HER2 1+ and Ki-67 15% referred to medical oncology for additional recommendations. Assessment & Plan Invasive ductal carcinoma of the right breast 7 mm tumor at 3 o'clock position, strongly ER/PR positive, no metastasis, no chemotherapy or radiation needed. - Recommend antiestrogen therapy post-surgery, either tamoxifen or aromatase inhibitors, for 5 years. - Discuss risks and benefits of tamoxifen: mild hot flashes, vaginal discharge, small risk of blood clots (1 in 4000) and uterine cancer (1 in 5000). - Discuss risks and benefits of aromatase inhibitors: potential bone density loss, post menopausal symptoms and arthralgias-  - Provide printed information on tamoxifen and aromatase inhibitors. At this time, she is not interested in adj radiation. She will however have a conversation with rad onc team. - Plan to start antiestrogen therapy 2-4 weeks post-surgery. - Schedule follow-up appointment to decide on antiestrogen therapy on May 6th at 10 AM.  Osteopenia Current Fosamax and vitamin D use. Antiestrogen therapy choice impacts bone density. - Continue Fosamax and vitamin D.  Melanoma Melanoma on left leg excised in 2005, negative sentinel lymph node, no recurrence.  Murleen Arms MD

## 2023-05-08 ENCOUNTER — Ambulatory Visit
Admission: RE | Admit: 2023-05-08 | Discharge: 2023-05-08 | Disposition: A | Discharge status: Home / Self Care | Source: Ambulatory Visit | Attending: Radiation Oncology | Admitting: Radiation Oncology

## 2023-05-08 ENCOUNTER — Encounter: Payer: Self-pay | Admitting: Radiation Oncology

## 2023-05-08 VITALS — BP 142/75 | HR 83 | Resp 18 | Wt 173.0 lb

## 2023-05-08 DIAGNOSIS — C50311 Malignant neoplasm of lower-inner quadrant of right female breast: Secondary | ICD-10-CM | POA: Insufficient documentation

## 2023-05-08 DIAGNOSIS — M129 Arthropathy, unspecified: Secondary | ICD-10-CM | POA: Diagnosis not present

## 2023-05-08 DIAGNOSIS — Z79899 Other long term (current) drug therapy: Secondary | ICD-10-CM | POA: Diagnosis not present

## 2023-05-08 DIAGNOSIS — Z85820 Personal history of malignant melanoma of skin: Secondary | ICD-10-CM | POA: Insufficient documentation

## 2023-05-08 DIAGNOSIS — Z87891 Personal history of nicotine dependence: Secondary | ICD-10-CM | POA: Diagnosis not present

## 2023-05-08 DIAGNOSIS — Z7982 Long term (current) use of aspirin: Secondary | ICD-10-CM | POA: Diagnosis not present

## 2023-05-08 DIAGNOSIS — E7800 Pure hypercholesterolemia, unspecified: Secondary | ICD-10-CM | POA: Diagnosis not present

## 2023-05-08 DIAGNOSIS — M8580 Other specified disorders of bone density and structure, unspecified site: Secondary | ICD-10-CM | POA: Diagnosis not present

## 2023-05-08 DIAGNOSIS — I10 Essential (primary) hypertension: Secondary | ICD-10-CM | POA: Diagnosis not present

## 2023-05-08 DIAGNOSIS — Z170 Estrogen receptor positive status [ER+]: Secondary | ICD-10-CM | POA: Insufficient documentation

## 2023-05-09 ENCOUNTER — Encounter: Payer: Self-pay | Admitting: *Deleted

## 2023-05-12 ENCOUNTER — Other Ambulatory Visit: Payer: Self-pay | Admitting: Surgery

## 2023-05-12 ENCOUNTER — Ambulatory Visit
Admission: RE | Admit: 2023-05-12 | Discharge: 2023-05-12 | Discharge status: Home / Self Care | Source: Ambulatory Visit | Attending: Surgery | Admitting: Surgery

## 2023-05-12 ENCOUNTER — Ambulatory Visit: Admission: RE | Admit: 2023-05-12 | Source: Ambulatory Visit

## 2023-05-12 DIAGNOSIS — C50911 Malignant neoplasm of unspecified site of right female breast: Secondary | ICD-10-CM

## 2023-05-12 HISTORY — PX: BREAST BIOPSY: SHX20

## 2023-05-12 NOTE — Anesthesia Preprocedure Evaluation (Addendum)
 Anesthesia Evaluation  Patient identified by MRN, date of birth, ID band Patient awake    Reviewed: Allergy & Precautions, NPO status , Patient's Chart, lab work & pertinent test results  History of Anesthesia Complications Negative for: history of anesthetic complications  Airway Mallampati: II  TM Distance: >3 FB Neck ROM: Full   Comment: Previous grade I view with Miller 2, easy mask Dental  (+) Dental Advisory Given   Pulmonary neg shortness of breath, neg sleep apnea, neg COPD, neg recent URI, former smoker   Pulmonary exam normal breath sounds clear to auscultation       Cardiovascular hypertension (losartan ), Pt. on medications (-) angina (-) Past MI, (-) Cardiac Stents and (-) CABG (-) dysrhythmias  Rhythm:Regular Rate:Normal  HLD, dilatation of aorta   Neuro/Psych neg Seizures BPPV  Neuromuscular disease (spondylosis)    GI/Hepatic Neg liver ROS,GERD  Medicated,,  Endo/Other  negative endocrine ROS    Renal/GU CRFRenal disease     Musculoskeletal  (+) Arthritis , Osteoarthritis,  Osteopenia    Abdominal   Peds  Hematology negative hematology ROS (+)   Anesthesia Other Findings   Reproductive/Obstetrics Right breast cancer                             Anesthesia Physical Anesthesia Plan  ASA: 2  Anesthesia Plan: General   Post-op Pain Management: Tylenol  PO (pre-op)*   Induction: Intravenous and Rapid sequence  PONV Risk Score and Plan: 3 and Ondansetron , Dexamethasone , Propofol  infusion, TIVA and Treatment may vary due to age or medical condition  Airway Management Planned: LMA  Additional Equipment:   Intra-op Plan:   Post-operative Plan: Extubation in OR  Informed Consent: I have reviewed the patients History and Physical, chart, labs and discussed the procedure including the risks, benefits and alternatives for the proposed anesthesia with the patient or  authorized representative who has indicated his/her understanding and acceptance.     Dental advisory given  Plan Discussed with: CRNA and Anesthesiologist  Anesthesia Plan Comments: (Risks of general anesthesia discussed including, but not limited to, sore throat, hoarse voice, chipped/damaged teeth, injury to vocal cords, nausea and vomiting, allergic reactions, lung infection, heart attack, stroke, and death. All questions answered. )        Anesthesia Quick Evaluation

## 2023-05-13 ENCOUNTER — Ambulatory Visit (HOSPITAL_BASED_OUTPATIENT_CLINIC_OR_DEPARTMENT_OTHER): Admitting: Anesthesiology

## 2023-05-13 ENCOUNTER — Ambulatory Visit
Admission: RE | Admit: 2023-05-13 | Discharge: 2023-05-13 | Disposition: A | Discharge status: Home / Self Care | Source: Ambulatory Visit | Attending: Surgery | Admitting: Surgery

## 2023-05-13 ENCOUNTER — Encounter (HOSPITAL_BASED_OUTPATIENT_CLINIC_OR_DEPARTMENT_OTHER)
Admission: RE | Disposition: A | Discharge status: Home / Self Care | Payer: Self-pay | Source: Home / Self Care | Attending: Surgery

## 2023-05-13 ENCOUNTER — Encounter (HOSPITAL_BASED_OUTPATIENT_CLINIC_OR_DEPARTMENT_OTHER): Payer: Self-pay | Admitting: Surgery

## 2023-05-13 ENCOUNTER — Other Ambulatory Visit: Payer: Self-pay

## 2023-05-13 ENCOUNTER — Ambulatory Visit (HOSPITAL_BASED_OUTPATIENT_CLINIC_OR_DEPARTMENT_OTHER)
Admission: RE | Admit: 2023-05-13 | Discharge: 2023-05-13 | Disposition: A | Discharge status: Home / Self Care | Attending: Surgery | Admitting: Surgery

## 2023-05-13 DIAGNOSIS — M8580 Other specified disorders of bone density and structure, unspecified site: Secondary | ICD-10-CM | POA: Insufficient documentation

## 2023-05-13 DIAGNOSIS — I10 Essential (primary) hypertension: Secondary | ICD-10-CM | POA: Diagnosis not present

## 2023-05-13 DIAGNOSIS — M479 Spondylosis, unspecified: Secondary | ICD-10-CM | POA: Diagnosis not present

## 2023-05-13 DIAGNOSIS — Z170 Estrogen receptor positive status [ER+]: Secondary | ICD-10-CM | POA: Insufficient documentation

## 2023-05-13 DIAGNOSIS — E785 Hyperlipidemia, unspecified: Secondary | ICD-10-CM | POA: Diagnosis not present

## 2023-05-13 DIAGNOSIS — C50911 Malignant neoplasm of unspecified site of right female breast: Secondary | ICD-10-CM

## 2023-05-13 DIAGNOSIS — C50211 Malignant neoplasm of upper-inner quadrant of right female breast: Secondary | ICD-10-CM | POA: Insufficient documentation

## 2023-05-13 DIAGNOSIS — M1990 Unspecified osteoarthritis, unspecified site: Secondary | ICD-10-CM | POA: Insufficient documentation

## 2023-05-13 DIAGNOSIS — K219 Gastro-esophageal reflux disease without esophagitis: Secondary | ICD-10-CM | POA: Diagnosis not present

## 2023-05-13 DIAGNOSIS — Z87891 Personal history of nicotine dependence: Secondary | ICD-10-CM | POA: Diagnosis not present

## 2023-05-13 DIAGNOSIS — Z1721 Progesterone receptor positive status: Secondary | ICD-10-CM | POA: Insufficient documentation

## 2023-05-13 DIAGNOSIS — Z79899 Other long term (current) drug therapy: Secondary | ICD-10-CM | POA: Insufficient documentation

## 2023-05-13 HISTORY — PX: BREAST LUMPECTOMY WITH RADIOACTIVE SEED LOCALIZATION: SHX6424

## 2023-05-13 SURGERY — BREAST LUMPECTOMY WITH RADIOACTIVE SEED LOCALIZATION
Anesthesia: General | Site: Breast | Laterality: Right

## 2023-05-13 MED ORDER — CEFAZOLIN SODIUM-DEXTROSE 2-4 GM/100ML-% IV SOLN
2.0000 g | INTRAVENOUS | Status: AC
  Administered 2023-05-13: 2 g via INTRAVENOUS

## 2023-05-13 MED ORDER — LIDOCAINE 2% (20 MG/ML) 5 ML SYRINGE
INTRAMUSCULAR | Status: DC | PRN
Start: 2023-05-13 — End: 2023-05-13
  Administered 2023-05-13: 80 mg via INTRAVENOUS

## 2023-05-13 MED ORDER — ONDANSETRON HCL 4 MG/2ML IJ SOLN
INTRAMUSCULAR | Status: DC | PRN
  Administered 2023-05-13: 4 mg via INTRAVENOUS

## 2023-05-13 MED ORDER — ONDANSETRON HCL 4 MG/2ML IJ SOLN
INTRAMUSCULAR | Status: AC
  Filled 2023-05-13: qty 2

## 2023-05-13 MED ORDER — OXYCODONE HCL 5 MG PO TABS
ORAL_TABLET | ORAL | Status: AC
  Filled 2023-05-13: qty 1

## 2023-05-13 MED ORDER — ACETAMINOPHEN 500 MG PO TABS
1000.0000 mg | ORAL_TABLET | ORAL | Status: AC
  Administered 2023-05-13: 1000 mg via ORAL

## 2023-05-13 MED ORDER — LIDOCAINE 2% (20 MG/ML) 5 ML SYRINGE
INTRAMUSCULAR | Status: AC
  Filled 2023-05-13: qty 5

## 2023-05-13 MED ORDER — ACETAMINOPHEN 500 MG PO TABS
ORAL_TABLET | ORAL | Status: AC
  Filled 2023-05-13: qty 2

## 2023-05-13 MED ORDER — FENTANYL CITRATE (PF) 100 MCG/2ML IJ SOLN
25.0000 ug | INTRAMUSCULAR | Status: DC | PRN
  Administered 2023-05-13 (×4): 25 ug via INTRAVENOUS

## 2023-05-13 MED ORDER — LACTATED RINGERS IV SOLN: INTRAVENOUS | Status: DC

## 2023-05-13 MED ORDER — BUPIVACAINE-EPINEPHRINE (PF) 0.25% -1:200000 IJ SOLN
INTRAMUSCULAR | Status: DC | PRN
  Administered 2023-05-13: 20 mL

## 2023-05-13 MED ORDER — PROPOFOL 10 MG/ML IV BOLUS
INTRAVENOUS | Status: DC | PRN
  Administered 2023-05-13: 150 mg via INTRAVENOUS

## 2023-05-13 MED ORDER — FENTANYL CITRATE (PF) 100 MCG/2ML IJ SOLN
INTRAMUSCULAR | Status: AC
  Filled 2023-05-13: qty 2

## 2023-05-13 MED ORDER — FENTANYL CITRATE (PF) 100 MCG/2ML IJ SOLN
INTRAMUSCULAR | Status: DC | PRN
  Administered 2023-05-13 (×2): 25 ug via INTRAVENOUS
  Administered 2023-05-13: 50 ug via INTRAVENOUS

## 2023-05-13 MED ORDER — DEXAMETHASONE SODIUM PHOSPHATE 10 MG/ML IJ SOLN
INTRAMUSCULAR | Status: AC
  Filled 2023-05-13: qty 1

## 2023-05-13 MED ORDER — CEFAZOLIN SODIUM-DEXTROSE 2-4 GM/100ML-% IV SOLN
INTRAVENOUS | Status: AC
  Filled 2023-05-13: qty 100

## 2023-05-13 MED ORDER — OXYCODONE HCL 5 MG PO TABS
5.0000 mg | ORAL_TABLET | Freq: Once | ORAL | Status: AC | PRN
  Administered 2023-05-13: 5 mg via ORAL

## 2023-05-13 MED ORDER — OXYCODONE HCL 5 MG PO TABS: 5.0000 mg | ORAL_TABLET | Freq: Four times a day (QID) | ORAL | 0 refills | Status: DC | PRN

## 2023-05-13 MED ORDER — OXYCODONE HCL 5 MG/5ML PO SOLN: 5.0000 mg | Freq: Once | ORAL | Status: AC | PRN

## 2023-05-13 MED ORDER — 0.9 % SODIUM CHLORIDE (POUR BTL) OPTIME
TOPICAL | Status: DC | PRN
Start: 2023-05-13 — End: 2023-05-13
  Administered 2023-05-13: 200 mL

## 2023-05-13 MED ORDER — PROPOFOL 500 MG/50ML IV EMUL
INTRAVENOUS | Status: DC | PRN
  Administered 2023-05-13: 125 ug/kg/min via INTRAVENOUS

## 2023-05-13 MED ORDER — AMISULPRIDE (ANTIEMETIC) 5 MG/2ML IV SOLN: 10.0000 mg | Freq: Once | INTRAVENOUS | Status: DC | PRN

## 2023-05-13 MED ORDER — DEXAMETHASONE SODIUM PHOSPHATE 10 MG/ML IJ SOLN
INTRAMUSCULAR | Status: DC | PRN
  Administered 2023-05-13: 4 mg via INTRAVENOUS

## 2023-05-13 SURGICAL SUPPLY — 41 items
BINDER BREAST LRG (GAUZE/BANDAGES/DRESSINGS) IMPLANT
BINDER BREAST MEDIUM (GAUZE/BANDAGES/DRESSINGS) IMPLANT
BINDER BREAST XLRG (GAUZE/BANDAGES/DRESSINGS) IMPLANT
BINDER BREAST XXLRG (GAUZE/BANDAGES/DRESSINGS) IMPLANT
BLADE SURG 15 STRL LF DISP TIS (BLADE) ×2 IMPLANT
CANISTER SUC SOCK COL 7IN (MISCELLANEOUS) IMPLANT
CANISTER SUCT 1200ML W/VALVE (MISCELLANEOUS) IMPLANT
CHLORAPREP W/TINT 26 (MISCELLANEOUS) ×2 IMPLANT
CLIP APPLIE 9.375 MED OPEN (MISCELLANEOUS) IMPLANT
COVER BACK TABLE 60X90IN (DRAPES) ×2 IMPLANT
COVER MAYO STAND STRL (DRAPES) ×2 IMPLANT
COVER PROBE CYLINDRICAL 5X96 (MISCELLANEOUS) ×2 IMPLANT
DERMABOND ADVANCED .7 DNX12 (GAUZE/BANDAGES/DRESSINGS) ×2 IMPLANT
DRAPE LAPAROSCOPIC ABDOMINAL (DRAPES) IMPLANT
DRAPE LAPAROTOMY 100X72 PEDS (DRAPES) ×2 IMPLANT
DRAPE UTILITY XL STRL (DRAPES) ×2 IMPLANT
ELECT COATED BLADE 2.86 ST (ELECTRODE) ×2 IMPLANT
ELECTRODE REM PT RTRN 9FT ADLT (ELECTROSURGICAL) ×2 IMPLANT
GLOVE BIOGEL PI IND STRL 8 (GLOVE) ×2 IMPLANT
GLOVE ECLIPSE 8.0 STRL XLNG CF (GLOVE) ×2 IMPLANT
GOWN STRL REUS W/ TWL LRG LVL3 (GOWN DISPOSABLE) ×4 IMPLANT
GOWN STRL REUS W/ TWL XL LVL3 (GOWN DISPOSABLE) ×2 IMPLANT
HEMOSTAT ARISTA ABSORB 3G PWDR (HEMOSTASIS) IMPLANT
HEMOSTAT SNOW SURGICEL 2X4 (HEMOSTASIS) IMPLANT
KIT MARKER MARGIN INK (KITS) ×2 IMPLANT
NDL HYPO 25X1 1.5 SAFETY (NEEDLE) ×2 IMPLANT
NEEDLE HYPO 25X1 1.5 SAFETY (NEEDLE) ×1 IMPLANT
NS IRRIG 1000ML POUR BTL (IV SOLUTION) ×2 IMPLANT
PACK BASIN DAY SURGERY FS (CUSTOM PROCEDURE TRAY) ×2 IMPLANT
PENCIL SMOKE EVACUATOR (MISCELLANEOUS) ×2 IMPLANT
SLEEVE SCD COMPRESS KNEE MED (STOCKING) ×2 IMPLANT
SPIKE FLUID TRANSFER (MISCELLANEOUS) IMPLANT
SPONGE T-LAP 4X18 ~~LOC~~+RFID (SPONGE) ×2 IMPLANT
SUT MNCRL AB 4-0 PS2 18 (SUTURE) ×2 IMPLANT
SUT SILK 2 0 SH (SUTURE) IMPLANT
SUT VICRYL 3-0 CR8 SH (SUTURE) ×2 IMPLANT
SYR CONTROL 10ML LL (SYRINGE) ×2 IMPLANT
TOWEL GREEN STERILE FF (TOWEL DISPOSABLE) ×2 IMPLANT
TRAY FAXITRON CT DISP (TRAY / TRAY PROCEDURE) ×2 IMPLANT
TUBE CONNECTING 20X1/4 (TUBING) IMPLANT
YANKAUER SUCT BULB TIP NO VENT (SUCTIONS) IMPLANT

## 2023-05-13 NOTE — H&P (Signed)
 History of Present Illness: Ashley Cooke is a 83 y.o. female who is seen today for consultation for newly diagnosed right breast cancer. Patient had a screening mammogram which showed a density in her right breast 7 mm in size right upper outer quadrant. 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 She gets yearly mammograms. No family history of breast cancer. She has had no complaints of breast pain, breast mass or nipple discharge..    Review of Systems: A complete review of systems was obtained from the patient. I have reviewed this information and discussed as appropriate with the patient. See HPI as well for other ROS.    Medical History: Past Medical History:  Diagnosis Date  Arthritis  History of cancer  Hypertension   There is no problem list on file for this patient.  Past Surgical History:  Procedure Laterality Date  JOINT REPLACEMENT  Melanoma removal  TONSILLECTOMY AND ADENOIDECTOMY    No Known Allergies  Current Outpatient Medications on File Prior to Visit  Medication Sig Dispense Refill  alendronate (FOSAMAX) 70 MG tablet Take by mouth  ascorbic acid , vitamin C , (VITAMIN C ) 500 MG tablet Take 500 mg by mouth  atorvastatin  (LIPITOR) 10 MG tablet Take 10 mg by mouth once daily  cholecalciferol  (VITAMIN D3) 1000 unit tablet Take 1,000 Units by mouth once daily  L.acid/L.casei/B.bif/B.lon/FOS (PROBIOTIC BLEND ORAL) Take by mouth  losartan  (COZAAR ) 50 MG tablet Take 50 mg by mouth once daily  meloxicam (MOBIC) 7.5 MG tablet Take 15 mg by mouth once daily as needed  multivitamin tablet Take 1 tablet by mouth once daily   No current facility-administered medications on file prior to visit.   Family History  Problem Relation Age of Onset  High blood pressure (Hypertension) Mother  High blood pressure (Hypertension) Sister  Diabetes Sister    Social History   Tobacco Use  Smoking Status Former  Types:  Cigarettes  Start date: 1987  Smokeless Tobacco Never    Social History   Socioeconomic History  Marital status: Married  Tobacco Use  Smoking status: Former  Types: Cigarettes  Start date: 1987  Smokeless tobacco: Never  Substance and Sexual Activity  Alcohol  use: Yes  Drug use: Never   Social Drivers of Health   Housing Stability: Unknown (05/02/2023)  Housing Stability Vital Sign  Homeless in the Last Year: No   Objective:   Vitals:  05/02/23 1018  BP: (!) 166/96  Pulse: 77  Temp: 36.5 C (97.7 F)  SpO2: 97%  Weight: 78.5 kg (173 lb)  Height: 158.8 cm (5' 2.5")   Body mass index is 31.14 kg/m.  Physical Exam Exam conducted with a chaperone present.  Cardiovascular:  Rate and Rhythm: Normal rate.  Pulmonary:  Effort: Pulmonary effort is normal.  Chest:  Breasts: Right: No mass or nipple discharge.  Left: No mass or nipple discharge.   Musculoskeletal:  General: Normal range of motion.  Lymphadenopathy:  Upper Body:  Right upper body: No supraclavicular or axillary adenopathy.  Left upper body: No supraclavicular or axillary adenopathy.  Skin: General: Skin is warm.  Neurological:  General: No focal deficit present.  Mental Status: She is alert.  Psychiatric:  Mood and Affect: Mood normal.     Labs, Imaging and Diagnostic Testing:  SURGICAL PATHOLOGY [facility] [address] [place], [place] [address] Telephone [phone] or [phone] Fax [phone]  REPORT OF SURGICAL PATHOLOGY  Accession #: [id number]  Patient Name: Ashley Cooke [facility] Visit # :   MRN: [id number] Physician: Mercie Stalker DOB/Age 27-Oct-1940 (Age: 41) Gender: F Collected Date: 04/29/2023 Received Date: 04/29/2023  FINAL DIAGNOSIS  1. Breast, right, needle core biopsy, 2:30 o'clock 4cmfn :  - INVASIVE DUCTAL CARCINOMA, SEE NOTE - TUBULE FORMATION: SCORE 3 - NUCLEAR PLEOMORPHISM: SCORE 2 - MITOTIC COUNT: SCORE 1 - TOTAL SCORE: 6 -  OVERALL GRADE: 2 - LYMPHOVASCULAR INVASION: NOT IDENTIFIED - CANCER LENGTH: 0.6 CM - CALCIFICATIONS: NOT IDENTIFIED - OTHER FINDINGS: NONE  Diagnosis Note : Dr. Brunetta Capes reviewed the case and concurs with the interpretation. A breast prognostic profile (ER, PR, Ki-67 and HER2) is pending and will be reported in an addendum. [facility] was notified on 04/30/2023.  DATE SIGNED OUT: 04/30/2023 ELECTRONIC SIGNATURE : Bernetta Brilliant Md, Nilesh, Pathologist, Electronic Signature  MICROSCOPIC DESCRIPTION  CASE COMMENTS STAINS USED IN DIAGNOSIS: H&E-2 H&E-3 H&E-4 H&E *RECUT 1 SLIDE Universal Negative Control-DAB Universal Negative Control-DAB Universal Negative Control-DAB Stains used in diagnosis 1 Her2 by IHC, 1 ER-ACIS, 1 KI-67-ACIS, 1 PR-ACIS IHC scores are reported using ASCO/CAP scoring criteria. An IHC Score of 0 or 1+ is NEGATIVE for HER2, 3+ is POSITIVE for HER2, and 2+ is EQUIVOCAL. Equivocal results are reflexed to either FISH or IHC testing. Specimens are fixed in 10% Neutral Buffered Formalin for at least 6 hours and up to 72 hours. These tests have not be validated on decalcified tissue. Results should be interpreted with caution given the possibility of false negative results on decalcified specimens. Antibody Clone for HER2 is 4B5 (PATHWAY). Some of these immunohistochemical stains may have been developed and the performance characteristics determined by [facility]. Some may not have been cleared or approved by the U.S. Food and Drug Administration. The FDA has determined that such clearance or approval is not necessary. This test is used for clinical purposes. It should not be regarded as investigational or for research. This laboratory is certified under the Clinical Laboratory Improvement Amendments of 1988 (CLIA-88) as qualified to perform high complexity clinical laboratory testing. Estrogen receptor (6F11), immunohistochemical  stains are performed on formalin fixed, paraffin embedded tissue using a 3,3"-diaminobenzidine (DAB) chromogen and Leica Bond Autostainer System. The staining intensity of the nucleus is scored manually and is reported as the percentage of tumor cell nuclei demonstrating specific nuclear staining.Specimens are fixed in 10% Neutral Buffered Formalin for at least 6 hours and up to 72 hours. These tests have not be validated on decalcified tissue. Results should be interpreted with caution given the possibility of false negative results on decalcified specimens. Ki-67 (MM1), immunohistochemical stains are performed on formalin fixed, paraffin embedded tissue using a 3,3"-diaminobenzidine (DAB) chromogen and Leica Bond Autostainer System. The staining intensity of the nucleus is scored manually and is reported as the percentage of tumor cell nuclei demonstrating specific nuclear staining.Specimens are fixed in 10% Neutral Buffered Formalin for at least 6 hours and up to 72 hours. These tests have not be validated on decalcified tissue. Results should be interpreted with caution given the possibility of false negative results on decalcified specimens. PR progesterone receptor (16), immunohistochemical stains are performed on formalin fixed, paraffin embedded tissue using a 3,3"-diaminobenzidine (DAB) chromogen and Leica Bond Autostainer System. The staining intensity of the nucleus is scored manually and is reported as the percentage of tumor cell nuclei demonstrating specific nuclear staining.Specimens are fixed in 10% Neutral Buffered Formalin for at least 6 hours and up to 72 hours. These tests have  not be validated on decalcified tissue. Results should be interpreted with caution given the possibility of false negative results on decalcified specimens.  ADDENDUM Breast, right, needle core biopsy, 2:30 o'clock 4cmfn PROGNOSTIC INDICATORS  Results: IMMUNOHISTOCHEMICAL AND MORPHOMETRIC  ANALYSIS PERFORMED MANUALLY The tumor cells are NEGATIVE for Her2 (1+). Estrogen Receptor: 100%, POSITIVE, STRONG STAINING INTENSITY Progesterone Receptor: 100%, POSITIVE, STRONG STAINING INTENSITY Proliferation Marker Ki67: 15% REFERENCE RANGE ESTROGEN RECEPTOR NEGATIVE 0% POSITIVE =>1% REFERENCE RANGE PROGESTERONE RECEPTOR NEGATIVE 0% POSITIVE =>1% All controls stained appropriately Dillard Frame Md, Pathologist, Electronic Signature ( Signed 04 10 2025)  CLINICAL HISTORY  SPECIMEN(S) OBTAINED 1. Breast, right, needle core biopsy, 2:30 O'clock 4cmfn  SPECIMEN COMMENTS: 1. TIF: 13:27pm SPECIMEN CLINICAL INFORMATION: 1. Suspicious for [facility]  Gross Description 1. Received in formalin labeled with the patient's name and "right breast 2:30 o'clock, 4 cm from nipple" are four cores of fibroadipose tissue ranging from 1.4 to 1.5 cm in length, each measuring 0.1 cm in diameter. The specimen is entirely submitted in one block. TIF 1327, CIT less than 5 mins. (KW:gt, 04/30/23)  EXAM:  DIGITAL DIAGNOSTIC UNILATERAL RIGHT MAMMOGRAM WITH TOMOSYNTHESIS AND  CAD; ULTRASOUND RIGHT BREAST LIMITED   TECHNIQUE:  Right digital diagnostic mammography and breast tomosynthesis was  performed. The images were evaluated with computer-aided detection.  ; Targeted ultrasound examination of the right breast was performed   COMPARISON: Previous exam(s).   ACR Breast Density Category b: There are scattered areas of  fibroglandular density.   FINDINGS:  Additional views of the right breast demonstrate a somewhat  irregular shaped mass with partial irregular partially obscured  borders over the inner midportion of the right breast containing a  single coarse calcification.   Targeted ultrasound is performed, showing a hypoechoic mass with  slight irregular shaped and somewhat irregular and indistinct  borders over the 2:30 position of the right breast 4 cm from the  nipple measuring 7 x 7 x 7 mm.  There is a single associated  microcalcification present.   Ultrasound axilla is within normal.   IMPRESSION:  Slightly suspicious 7 mm mass over the 2:30 position of the right  breast.   RECOMMENDATION:  Recommend ultrasound-guided core needle biopsy for further  evaluation.   I have discussed the findings and recommendations with the patient.  If applicable, a reminder letter will be sent to the patient  regarding the next appointment.   BI-RADS CATEGORY 4: Suspicious.   Biopsy scheduling will be facilitated by the ultrasonographer.   Assessment and Plan:   Diagnoses and all orders for this visit:  Breast cancer, stage 1, right (CMS/HHS-HCC) - Ambulatory Referral to Oncology-Medical - Ambulatory Referral to Radiation Oncology    Discussed breast conserving surgery versus mastectomy with reconstruction. Reviewed the literature all on sentinel lymph node mapping and patient's with small tumors over the age of 28. She wishes to have a right breast seed lumpectomy. I discussed the omission of sentinel lymph node mapping after reviewing the recent data and potential risk of finding some lymph node disease in less than 8% of patients. She also has had both shoulders replaced and I have concerns about lymphedema in this case. Refer to medical radiation oncology. The patient is agreed to proceed with right breast seed lumpectomy. The procedure has been discussed with the patient. Alternatives to surgery have been discussed with the patient. Risks of surgery include bleeding, Infection, Seroma formation, death, and the need for further surgery. The patient understands and wishes to proceed.  No follow-ups on file.  Sharlee Dawes, MD

## 2023-05-13 NOTE — Op Note (Signed)
 Preoperative diagnosis: Stage I right breast cancer upper inner quadrant ER positive  Postoperative diagnosis: Same  Procedure: Right breast seed localized lumpectomy  Surgeon: Sim Dryer, MD  Anesthesia: LMA with 0.25% Marcaine  with epinephrine   EBL: Minimal  Specimen: Right breast tissue with seed and clip verified by intraoperative imaging plus additional medial margin.  All tissue oriented with ink.  Grossly margins clear.  Drains: None  Indications for procedure: The patient is an 83 year old female with stage I right breast cancer.  She opted for breast conserving surgery after being seen in the multidisciplinary clinic evaluated by all subspecialist.The procedure has been discussed with the patient. Alternatives to surgery have been discussed with the patient.  Risks of surgery include bleeding,  Infection,  Seroma formation, death,  and the need for further surgery.   The patient understands and wishes to proceed.   Description of procedure: The patient was met in the holding area and questions were answered.  A seed was placed per radiology as an outpatient.  The right breast was marked as the correct side and images were available for review.  She was taken back to the operative room.  She was placed supine upon the OR table.  After induction of general anesthesia, right breast was prepped and draped in sterile fashion timeout performed.  Neoprobe used to identify the seed in the right medial to right upper inner quadrant approximately 1 cm from the nipple areolar complex.  A curvilinear incision was made along the medial border of the nipple areolar complex.  Dissection was carried down and all tissue around the seed and clip were excised with grossly negative margins.  Imaging revealed the seed and clip to be present but the medial margin the close.  Additional medial margin taken all tissue oriented with ink.  Specimen sent to pathology for further evaluation.  The cavity is made  hemostatic with cautery.  Irrigation was used.  Local anesthetic infiltrated throughout the cavity.  Deep tissue planes were approximated with 3-0 Vicryl.  4 Monocryl was used to close the skin in a subcuticular fashion.  Dermabond applied.  All counts found to be correct.  Breast binder placed.  The patient was awoke extubated taken to recovery in satisfactory condition.

## 2023-05-13 NOTE — Anesthesia Procedure Notes (Signed)
 Procedure Name: LMA Insertion Date/Time: 05/13/2023 12:00 PM  Performed by: Glorya Larsson, CRNAPre-anesthesia Checklist: Patient identified, Emergency Drugs available, Suction available and Patient being monitored Patient Re-evaluated:Patient Re-evaluated prior to induction Oxygen Delivery Method: Circle System Utilized Preoxygenation: Pre-oxygenation with 100% oxygen Induction Type: IV induction Ventilation: Mask ventilation without difficulty LMA: LMA inserted LMA Size: 4.0 Number of attempts: 1 Airway Equipment and Method: Bite block Placement Confirmation: positive ETCO2 Tube secured with: Tape Dental Injury: Teeth and Oropharynx as per pre-operative assessment

## 2023-05-13 NOTE — Anesthesia Postprocedure Evaluation (Signed)
 Anesthesia Post Note  Patient: Ashley Cooke  Procedure(s) Performed: BREAST LUMPECTOMY WITH RADIOACTIVE SEED LOCALIZATION (Right: Breast)     Patient location during evaluation: PACU Anesthesia Type: General Level of consciousness: awake Pain management: pain level controlled Vital Signs Assessment: post-procedure vital signs reviewed and stable Respiratory status: spontaneous breathing, nonlabored ventilation and respiratory function stable Cardiovascular status: blood pressure returned to baseline and stable Postop Assessment: no apparent nausea or vomiting Anesthetic complications: no   No notable events documented.  Last Vitals:  Vitals:   05/13/23 1352 05/13/23 1355  BP:    Pulse: 63 61  Resp: 12 10  Temp:  (!) 36.3 C  SpO2: 98% 99%    Last Pain:  Vitals:   05/13/23 1413  TempSrc:   PainSc: 3                  Conard Decent

## 2023-05-13 NOTE — Transfer of Care (Signed)
 Immediate Anesthesia Transfer of Care Note  Patient: Ashley Cooke  Procedure(s) Performed: BREAST LUMPECTOMY WITH RADIOACTIVE SEED LOCALIZATION (Right: Breast)  Patient Location: PACU  Anesthesia Type:General  Level of Consciousness: awake and patient cooperative  Airway & Oxygen Therapy: Patient Spontanous Breathing and Patient connected to face mask oxygen  Post-op Assessment: Report given to RN and Post -op Vital signs reviewed and stable  Post vital signs: Reviewed and stable  Last Vitals:  Vitals Value Taken Time  BP 160/76   Temp    Pulse 78 05/13/23 1253  Resp 9 05/13/23 1253  SpO2 99 % 05/13/23 1253  Vitals shown include unfiled device data.  Last Pain:  Vitals:   05/13/23 1021  TempSrc: Temporal  PainSc: 9          Complications: No notable events documented.

## 2023-05-13 NOTE — Interval H&P Note (Signed)
 History and Physical Interval Note:  05/13/2023 11:20 AM  Ashley Cooke  has presented today for surgery, with the diagnosis of RIGHT BREAST CANCER.  The various methods of treatment have been discussed with the patient and family. After consideration of risks, benefits and other options for treatment, the patient has consented to  Procedure(s) with comments: BREAST LUMPECTOMY WITH RADIOACTIVE SEED LOCALIZATION (Right) - RIGHT BREAST SEED LUMPECTOMY as a surgical intervention.  The patient's history has been reviewed, patient examined, no change in status, stable for surgery.  I have reviewed the patient's chart and labs.  Questions were answered to the patient's satisfaction.   The procedure has been discussed with the patient. Alternatives to surgery have been discussed with the patient.  Risks of surgery include bleeding,  Infection,  Seroma formation, death,  and the need for further surgery.   The patient understands and wishes to proceed.   Gazella Anglin A Nathyn Luiz

## 2023-05-13 NOTE — Discharge Instructions (Addendum)
 [facility] Office Phone Number [phone]  BREAST BIOPSY/ PARTIAL MASTECTOMY: POST OP INSTRUCTIONS  Always review your discharge instruction sheet given to you by the facility where your surgery was performed.  IF YOU HAVE DISABILITY OR FAMILY LEAVE FORMS, YOU MUST BRING THEM TO THE OFFICE FOR PROCESSING.  DO NOT GIVE THEM TO YOUR DOCTOR.  A prescription for pain medication may be given to you upon discharge.  Take your pain medication as prescribed, if needed.  If narcotic pain medicine is not needed, then you may take acetaminophen  (Tylenol ) or ibuprofen (Advil) as needed. Take your usually prescribed medications unless otherwise directed If you need a refill on your pain medication, please contact your pharmacy.  They will contact our office to request authorization.  Prescriptions will not be filled after 5pm or on week-ends. You should eat very light the first 24 hours after surgery, such as soup, crackers, pudding, etc.  Resume your normal diet the day after surgery. Most patients will experience some swelling and bruising in the breast.  Ice packs and a good support bra will help.  Swelling and bruising can take several days to resolve.  It is common to experience some constipation if taking pain medication after surgery.  Increasing fluid intake and taking a stool softener will usually help or prevent this problem from occurring.  A mild laxative (Milk of Magnesia or Miralax ) should be taken according to package directions if there are no bowel movements after 48 hours. Unless discharge instructions indicate otherwise, you may remove your bandages 24-48 hours after surgery, and you may shower at that time.  You may have steri-strips (small skin tapes) in place directly over the incision.  These strips should be left on the skin for 7-10 days.  If your surgeon used skin glue on the incision, you may shower in 24 hours.  The glue will flake off over the next 2-3 weeks.  Any  sutures or staples will be removed at the office during your follow-up visit. ACTIVITIES:  You may resume regular daily activities (gradually increasing) beginning the next day.  Wearing a good support bra or sports bra minimizes pain and swelling.  You may have sexual intercourse when it is comfortable. You may drive when you no longer are taking prescription pain medication, you can comfortably wear a seatbelt, and you can safely maneuver your car and apply brakes. RETURN TO WORK:  ______________________________________________________________________________________ Ashley Cooke should see your doctor in the office for a follow-up appointment approximately two weeks after your surgery.  Your doctor's nurse will typically make your follow-up appointment when she calls you with your pathology report.  Expect your pathology report 2-3 business days after your surgery.  You may call to check if you do not hear from us  after three days. OTHER INSTRUCTIONS: _______________________________________________________________________________________________ _____________________________________________________________________________________________________________________________________ _____________________________________________________________________________________________________________________________________ _____________________________________________________________________________________________________________________________________  WHEN TO CALL YOUR DOCTOR: Fever over 101.0 Nausea and/or vomiting. Extreme swelling or bruising. Continued bleeding from incision. Increased pain, redness, or drainage from the incision.  The clinic staff is available to answer your questions during regular business hours.  Please don't hesitate to call and ask to speak to one of the nurses for clinical concerns.  If you have a medical emergency, go to the nearest emergency room or call 911.  A surgeon from [facility] is always on call at the hospital.  For further questions, please visit [email or web address]   Post Anesthesia Home Care Instructions  Activity: Get plenty of rest for the remainder of the  day. A responsible adult should stay with you for 24 hours following the procedure.  For the next 24 hours, DO NOT: -Drive a car -Advertising copywriter -Drink alcoholic beverages -Take any medication unless instructed by your physician -Make any legal decisions or sign important papers.  Meals: Start with liquid foods such as gelatin or soup. Progress to regular foods as tolerated. Avoid greasy, spicy, heavy foods. If nausea and/or vomiting occur, drink only clear liquids until the nausea and/or vomiting subsides. Call your physician if vomiting continues.  Special Instructions/Symptoms: Your throat may feel dry or sore from the anesthesia or the breathing tube placed in your throat during surgery. If this causes discomfort, gargle with warm salt water. The discomfort should disappear within 24 hours.  [name]

## 2023-05-14 ENCOUNTER — Encounter (HOSPITAL_BASED_OUTPATIENT_CLINIC_OR_DEPARTMENT_OTHER): Payer: Self-pay | Admitting: Surgery

## 2023-05-14 ENCOUNTER — Ambulatory Visit: Admitting: Hematology and Oncology

## 2023-05-15 LAB — SURGICAL PATHOLOGY

## 2023-05-16 ENCOUNTER — Encounter: Payer: Self-pay | Admitting: *Deleted

## 2023-05-16 ENCOUNTER — Encounter: Payer: Self-pay | Admitting: Surgery

## 2023-05-22 NOTE — Progress Notes (Incomplete)
 Location of Breast Cancer:right    Histology per Pathology Report:    Receptor Status: ER(positive ), PR (positive), Her2-neu (negative), Ki-(15%)  Did patient present with symptoms (if so, please note symptoms) or was this found on screening mammography?: mamogram  Past/Anticipated interventions by surgeon, if WUJ:WJXBJ    Past/Anticipated interventions by medical oncology, if any:   Lymphedema issues, if any:  {:18581} {t:21944}   Pain issues, if any:  {:18581} {PAIN DESCRIPTION:21022940}  Skin issues if any   SAFETY ISSUES: Prior radiation? {:18581} Pacemaker/ICD? {:18581} Possible current pregnancy?{:18581} Is the patient on methotrexate? {:18581}  Current Complaints / other details:  ***    Baldwin Bones, LPN 04/27/8293,6:21 AM

## 2023-05-25 NOTE — Progress Notes (Incomplete)
 Radiation Oncology         [phone] ________________________________  Name: Ashley Cooke MRN: [id number]  Date: 05/26/2023  DOB: 1940-09-30  Re-Evaluation Note  CC: Tisovec, Kristina Pfeiffer, MD  Sim Dryer, MD    ICD-10-CM   1. Malignant neoplasm of lower-inner quadrant of right breast of female, estrogen receptor positive (HCC)  C50.311    Z17.0       Diagnosis: Stage IA (cT1b, cN0, cM0) Right Breast LIQ, Invasive Ductal Carcinoma, ER+ / PR+ / Her2-, Grade 2: s/p right breast lumpectomy without nodal biopsies   Cancer Staging  Malignant neoplasm of lower-inner quadrant of female breast [facility]) Staging form: Breast, AJCC 8th Edition - Clinical stage from 05/07/2023: Stage IA (cT1b, cN0, cM0, G2, ER+, PR+, HER2-) - Signed by Murleen Arms, MD on 05/07/2023  Narrative:  The patient returns today to discuss radiation treatment options. She was seen in consultation on 05/08/23.   Since her consultation date, she opted to proceed with a right breast lumpectomy without nodal biopsies on 05/13/23 under the care of Dr. Afton Horse.  Pathology from the procedure revealed: tumor the size of 1.0 cm; histology of grade 2 invasive ductal carcinoma; all margins negative for invasive carcinoma; margin status to invasive disease of 3 mm from the inferior margin; no lymph nodes were examined. Prognostic indicators significant for: estrogen receptor 100% positive and progesterone receptor 100% positive, both with strong staining intensity; Proliferation marker Ki67 at 15%; Her2 status negative; Grade 2.   Based on her discussion with Dr. Arno Bibles on 05/07/23, she is agreeable to proceed with either tamoxifen or AI. She did express that she was disinterested in pursuing radiation at that time. If she decides not pursue XRT after our discussion today, Dr. Arno Bibles will have her start antihormonal therapy.   On review of systems, the patient reports ***. She denies *** and any other symptoms.    Allergies:  has  no known allergies.  Meds: Current Outpatient Medications  Medication Sig Dispense Refill   acetaminophen  (TYLENOL ) 500 MG tablet Take 1,000 mg by mouth every 6 (six) hours as needed. (Patient not taking: Reported on 05/08/2023)     alendronate (FOSAMAX) 70 MG tablet Take by mouth. (Patient not taking: Reported on 05/08/2023)     aspirin  EC 81 MG tablet Take 81 mg by mouth daily. (Patient not taking: Reported on 05/08/2023)     atorvastatin  (LIPITOR) 10 MG tablet Take 10 mg by mouth every evening.  (Patient not taking: Reported on 05/08/2023)     Calcium  Carbonate-Vitamin D  (CALCIUM  600+D PO) Take 1 tablet by mouth daily. (Patient not taking: Reported on 05/08/2023)     cholecalciferol  (VITAMIN D ) 1000 UNITS tablet Take 1,000 Units by mouth daily.  (Patient not taking: Reported on 05/08/2023)     Docusate Calcium  (STOOL SOFTENER PO) Take by mouth.     famotidine  (PEPCID ) 20 MG tablet Take 20 mg by mouth daily. (Patient not taking: Reported on 05/08/2023)     Hypromellose (ARTIFICIAL TEARS OP) Apply 1 drop to eye 4 (four) times daily as needed (dry eyes). (Patient not taking: Reported on 05/08/2023)     ketotifen (ZADITOR) 0.025 % ophthalmic solution Place 1 drop into both eyes 2 (two) times daily as needed (allergies). (Patient not taking: Reported on 05/08/2023)     loratadine  (CLARITIN ) 10 MG tablet Take 10 mg by mouth daily as needed (seasonal allergies).  (Patient not taking: Reported on 05/08/2023)     losartan  (COZAAR ) 50 MG tablet Take  50 mg by mouth daily.     meloxicam (MOBIC) 7.5 MG tablet Take 15 mg by mouth daily as needed. (Patient not taking: Reported on 05/08/2023)     Multiple Vitamin (MULTIVITAMIN WITH MINERALS) TABS tablet Take 1 tablet by mouth daily. (Patient not taking: Reported on 05/08/2023)     oxyCODONE  (OXY IR/ROXICODONE ) 5 MG immediate release tablet Take 1 tablet (5 mg total) by mouth every 6 (six) hours as needed for severe pain (pain score 7-10). 15 tablet 0   Probiotic Product  (PROBIOTIC PO) Take by mouth every other day. (Patient not taking: Reported on 05/08/2023)     vitamin C  (ASCORBIC ACID ) 500 MG tablet Take 500 mg by mouth daily.  (Patient not taking: Reported on 05/08/2023)     No current facility-administered medications for this encounter.    Physical Findings: The patient is in no acute distress. Patient is alert and oriented.  vitals were not taken for this visit.  No significant changes. Lungs are clear to auscultation bilaterally. Heart has regular rate and rhythm. No palpable cervical, supraclavicular, or axillary adenopathy. Abdomen soft, non-tender, normal bowel sounds. Left Breast: no palpable mass, nipple discharge or bleeding. Right Breast: ***  Lab Findings: Lab Results  Component Value Date   WBC 6.5 03/07/2017   HGB 14.5 03/07/2017   HCT 43.4 03/07/2017   MCV 95.6 03/07/2017   PLT 308 03/07/2017    Radiographic Findings: MM Breast Surgical Specimen Result Date: 05/13/2023 CLINICAL DATA:  Patient is status post ultrasound-guided biopsy of a RIGHT breast mass which demonstrated invasive ductal carcinoma (COIL clip). Patient underwent seed localization of the mass. EXAM: SPECIMEN RADIOGRAPH OF THE RIGHT BREAST COMPARISON:  Previous exam(s). FINDINGS: Status post excision of the RIGHT breast. The radioactive seed and COIL biopsy marker clip are present, completely intact. IMPRESSION: Specimen radiograph of the RIGHT breast. These results were called by telephone at the time of interpretation on 05/13/2023 at 12:32 pm to OR staff, who verbally acknowledged these results. Electronically Signed   By: Clancy Crimes M.D.   On: 05/13/2023 12:32   MM RT RADIOACTIVE SEED LOC MAMMO GUIDE Result Date: 05/12/2023 CLINICAL DATA:  Patient presents for radioactive seed localization prior to surgical excision of biopsy-proven malignancy over the inner mid to upper right breast marked by coil clip. EXAM: MAMMOGRAPHIC GUIDED RADIOACTIVE SEED LOCALIZATION OF  THE RIGHT BREAST COMPARISON:  Previous exam(s). FINDINGS: Patient presents for radioactive seed localization prior to surgical excision. I met with the patient and we discussed the procedure of seed localization including benefits and alternatives. We discussed the high likelihood of a successful procedure. We discussed the risks of the procedure including infection, bleeding, tissue injury and further surgery. We discussed the low dose of radioactivity involved in the procedure. Informed, written consent was given. The usual time-out protocol was performed immediately prior to the procedure. Using mammographic guidance, sterile technique, 1% lidocaine  and an I-125 radioactive seed, the targeted coil clip over the inner mid to upper right breast was localized using a medial to lateral approach. Mammographic images demonstrate the seed immediately lateral to the clip on the CC image and immediately superior to the clip on the true lateral image. The follow-up mammogram images confirm the seed in the expected location and were marked for Dr. Afton Horse. Follow-up survey of the patient confirms presence of the radioactive seed. Order number of I-125 seed:  [id number]. Total activity:  0.236 millicurie reference Date: 04/10/2023 The patient tolerated the procedure well and was released from  the [facility]. She was given instructions regarding seed removal. IMPRESSION: Radioactive seed localization right breast. No apparent complications. Electronically Signed   By: Roda Cirri M.D.   On: 05/12/2023 15:12   US  RT BREAST BX W LOC DEV 1ST LESION IMG BX SPEC US  GUIDE Addendum Date: 05/01/2023 ADDENDUM REPORT: 05/01/2023 15:59 ADDENDUM: PATHOLOGY revealed: 1. Breast, right, needle core biopsy, 2:30 o'clock 4 cmfn: - INVASIVE DUCTAL CARCINOMA, - OVERALL GRADE: 2 - LYMPHOVASCULAR INVASION: NOT IDENTIFIED - CANCER LENGTH: 0.6 CM - CALCIFICATIONS: NOT IDENTIFIED. Pathology results are CONCORDANT with imaging findings, per  Dr. Mercie Stalker. Pathology results and recommendations were discussed with patient via telephone on 05/01/2023 by Ladonna Pickup RN. Patient reported biopsy site doing well with no adverse symptoms, and only slight tenderness at the site. Post biopsy care instructions were reviewed, questions were answered and my direct phone number was provided. Patient was instructed to call [facility] for any additional questions or concerns related to biopsy site. RECOMMENDATION:1. Surgical and oncological consultation. Request for surgical consultation relayed to Alphonse Asal at [facility] on 05/01/2023 by Ladonna Pickup RN. Surgical consultation has been arranged for patient to see Dr. Lawrnce Pretzel at [facility] on 05/02/2023 at 10:00. Pathology results reported by Ladonna Pickup RN on 05/01/2023. Electronically Signed   By: Mercie Stalker M.D.   On: 05/01/2023 15:59   Result Date: 05/01/2023 CLINICAL DATA:  Suspicious 0.7 cm mass at the right breast 2:30 position 4 cm from the nipple. EXAM: ULTRASOUND GUIDED RIGHT BREAST CORE NEEDLE BIOPSY COMPARISON:  Previous exam(s). PROCEDURE: I met with the patient and we discussed the procedure of ultrasound-guided biopsy, including benefits and alternatives. We discussed the high likelihood of a successful procedure. We discussed the risks of the procedure, including infection, bleeding, tissue injury, clip migration, and inadequate sampling. Informed written consent was given. The usual time-out protocol was performed immediately prior to the procedure. Lesion quadrant: Upper inner quadrant Using sterile technique and 1% Lidocaine  as local anesthetic, under direct ultrasound visualization, a 14 gauge spring-loaded device was used to perform biopsy of a 0.7 cm mass at right breast 2:30 position 4 cm from the nipple using an inferior approach. At the conclusion of the procedure a coil shaped tissue marker clip was deployed into the biopsy cavity.  Follow up 2 view mammogram was performed and dictated separately. IMPRESSION: Ultrasound guided biopsy of a 0.7 cm mass at the right breast 2:30 position 4 cm from the nipple. No apparent complications. Electronically Signed: By: Mercie Stalker M.D. On: 04/29/2023 13:41   MM CLIP PLACEMENT RIGHT Result Date: 04/29/2023 CLINICAL DATA:  Postprocedure mammogram. EXAM: 3D DIAGNOSTIC RIGHT MAMMOGRAM POST ULTRASOUND BIOPSY COMPARISON:  Previous exam(s). ACR Breast Density Category b: There are scattered areas of fibroglandular density. FINDINGS: 3D Mammographic images were obtained following ultrasound guided biopsy of a 0.7 cm mass at the right breast 2:30 position 4 cm from the nipple. The biopsy marking clip is in expected position at the site of biopsy. IMPRESSION: Appropriate positioning of the coil shaped biopsy marking clip at the site of biopsy in the central inner right breast at middle depth. Final Assessment: Post Procedure Mammograms for Marker Placement Electronically Signed   By: Mercie Stalker M.D.   On: 04/29/2023 13:40   MM 3D DIAGNOSTIC MAMMOGRAM UNILATERAL RIGHT BREAST Result Date: 04/28/2023 CLINICAL DATA:  Recall from screening to evaluate a possible right breast mass. EXAM: DIGITAL DIAGNOSTIC UNILATERAL RIGHT MAMMOGRAM WITH TOMOSYNTHESIS AND CAD;  ULTRASOUND RIGHT BREAST LIMITED TECHNIQUE: Right digital diagnostic mammography and breast tomosynthesis was performed. The images were evaluated with computer-aided detection. ; Targeted ultrasound examination of the right breast was performed COMPARISON:  Previous exam(s). ACR Breast Density Category b: There are scattered areas of fibroglandular density. FINDINGS: Additional views of the right breast demonstrate a somewhat irregular shaped mass with partial irregular partially obscured borders over the inner midportion of the right breast containing a single coarse calcification. Targeted ultrasound is performed, showing a hypoechoic mass with slight  irregular shaped and somewhat irregular and indistinct borders over the 2:30 position of the right breast 4 cm from the nipple measuring 7 x 7 x 7 mm. There is a single associated microcalcification present. Ultrasound axilla is within normal. IMPRESSION: Slightly suspicious 7 mm mass over the 2:30 position of the right breast. RECOMMENDATION: Recommend ultrasound-guided core needle biopsy for further evaluation. I have discussed the findings and recommendations with the patient. If applicable, a reminder letter will be sent to the patient regarding the next appointment. BI-RADS CATEGORY  4: Suspicious. Biopsy scheduling will be facilitated by the ultrasonographer. Electronically Signed   By: Roda Cirri M.D.   On: 04/28/2023 14:42   US  LIMITED ULTRASOUND INCLUDING AXILLA RIGHT BREAST Result Date: 04/28/2023 CLINICAL DATA:  Recall from screening to evaluate a possible right breast mass. EXAM: DIGITAL DIAGNOSTIC UNILATERAL RIGHT MAMMOGRAM WITH TOMOSYNTHESIS AND CAD; ULTRASOUND RIGHT BREAST LIMITED TECHNIQUE: Right digital diagnostic mammography and breast tomosynthesis was performed. The images were evaluated with computer-aided detection. ; Targeted ultrasound examination of the right breast was performed COMPARISON:  Previous exam(s). ACR Breast Density Category b: There are scattered areas of fibroglandular density. FINDINGS: Additional views of the right breast demonstrate a somewhat irregular shaped mass with partial irregular partially obscured borders over the inner midportion of the right breast containing a single coarse calcification. Targeted ultrasound is performed, showing a hypoechoic mass with slight irregular shaped and somewhat irregular and indistinct borders over the 2:30 position of the right breast 4 cm from the nipple measuring 7 x 7 x 7 mm. There is a single associated microcalcification present. Ultrasound axilla is within normal. IMPRESSION: Slightly suspicious 7 mm mass over the 2:30  position of the right breast. RECOMMENDATION: Recommend ultrasound-guided core needle biopsy for further evaluation. I have discussed the findings and recommendations with the patient. If applicable, a reminder letter will be sent to the patient regarding the next appointment. BI-RADS CATEGORY  4: Suspicious. Biopsy scheduling will be facilitated by the ultrasonographer. Electronically Signed   By: Roda Cirri M.D.   On: 04/28/2023 14:42    Impression:  Stage IA (cT1b, cN0, cM0) Right Breast LIQ, Invasive Ductal Carcinoma, ER+ / PR+ / Her2-, Grade 2: s/p right breast lumpectomy without nodal biopsies  ***  Plan:  Patient is scheduled for CT simulation {date/later today}. ***  -----------------------------------  Noralee Beam, PhD, MD  This document serves as a record of services personally performed by Retta Caster, MD. It was created on his behalf by Aleta Anda, a trained medical scribe. The creation of this record is based on the scribe's personal observations and the provider's statements to them. This document has been checked and approved by the attending provider.

## 2023-05-26 ENCOUNTER — Ambulatory Visit
Admission: RE | Admit: 2023-05-26 | Discharge: 2023-05-26 | Disposition: A | Discharge status: Home / Self Care | Source: Ambulatory Visit | Attending: Radiation Oncology | Admitting: Radiation Oncology

## 2023-05-26 ENCOUNTER — Ambulatory Visit

## 2023-05-26 DIAGNOSIS — C50311 Malignant neoplasm of lower-inner quadrant of right female breast: Secondary | ICD-10-CM

## 2023-05-27 ENCOUNTER — Ambulatory Visit: Admitting: Hematology and Oncology

## 2023-05-27 ENCOUNTER — Encounter: Payer: Self-pay | Admitting: *Deleted

## 2023-05-27 NOTE — Progress Notes (Signed)
 Location of Breast Cancer:right    Histology per Pathology Report:    Receptor Status: ER(positive ), PR (positive), Her2-neu (negative), Ki-(15%)  Did patient present with symptoms (if so, please note symptoms) or was this found on screening mammography?: mamogram  Past/Anticipated interventions by surgeon, if ZOX:[id number]    Past/Anticipated interventions by medical oncology, if any:   Lymphedema issues, if any:  {:18581} {t:21944}   Pain issues, if any:  {:18581} {PAIN DESCRIPTION:[id number]}  Skin issues if any   SAFETY ISSUES: Prior radiation? {:18581} Pacemaker/ICD? {:18581} Possible current pregnancy?{:18581} Is the patient on methotrexate? {:18581}  Current Complaints / other details:  ***    Baldwin Bones, LPN 05/25/979,19:14 AM

## 2023-05-27 NOTE — Progress Notes (Signed)
 Radiation Oncology         [phone]) [phone] ________________________________  Name: Ashley Cooke MRN: [id number]  Date: 05/28/2023  DOB: Apr 25, 1940  Re-Evaluation Note  CC: Tisovec, Kristina Pfeiffer, MD  Sim Dryer, MD  No diagnosis found.   Diagnosis: Stage IA (cT1b, cN0, cM0) Right Breast LIQ, Invasive Ductal Carcinoma, ER+ / PR+ / Her2-, Grade 2: s/p right breast lumpectomy without nodal biopsies   Cancer Staging  Malignant neoplasm of lower-inner quadrant of female breast [facility]) Staging form: Breast, AJCC 8th Edition - Clinical stage from 05/07/2023: Stage IA (cT1b, cN0, cM0, G2, ER+, PR+, HER2-) - Signed by Murleen Arms, MD on 05/07/2023  Narrative:  The patient returns today to discuss radiation treatment options. She was seen in consultation on 05/08/23.   Since her consultation date, she opted to proceed with a right breast lumpectomy without nodal biopsies on 05/13/23 under the care of Dr. Afton Horse.  Pathology from the procedure revealed: tumor the size of 1.0 cm; histology of grade 2 invasive ductal carcinoma; all margins negative for invasive carcinoma; margin status to invasive disease of 3 mm from the inferior margin; no lymph nodes were examined. Prognostic indicators significant for: estrogen receptor 100% positive and progesterone receptor 100% positive, both with strong staining intensity; Proliferation marker Ki67 at 15%; Her2 status negative; Grade 2.   Based on her discussion with Dr. Arno Bibles on 05/07/23, she is agreeable to proceed with either tamoxifen or AI. She did express that she was disinterested in pursuing radiation at that time. If she decides not pursue XRT after our discussion today, Dr. Arno Bibles will have her start antihormonal therapy.   On review of systems, the patient reports ***. She denies *** and any other symptoms.    Allergies:  has no known allergies.  Meds: Current Outpatient Medications  Medication Sig Dispense Refill   acetaminophen  (TYLENOL ) 500  MG tablet Take 1,000 mg by mouth every 6 (six) hours as needed. (Patient not taking: Reported on 05/08/2023)     alendronate (FOSAMAX) 70 MG tablet Take by mouth. (Patient not taking: Reported on 05/08/2023)     aspirin  EC 81 MG tablet Take 81 mg by mouth daily. (Patient not taking: Reported on 05/08/2023)     atorvastatin  (LIPITOR) 10 MG tablet Take 10 mg by mouth every evening.  (Patient not taking: Reported on 05/08/2023)     Calcium  Carbonate-Vitamin D  (CALCIUM  600+D PO) Take 1 tablet by mouth daily. (Patient not taking: Reported on 05/08/2023)     cholecalciferol  (VITAMIN D ) 1000 UNITS tablet Take 1,000 Units by mouth daily.  (Patient not taking: Reported on 05/08/2023)     Docusate Calcium  (STOOL SOFTENER PO) Take by mouth.     famotidine  (PEPCID ) 20 MG tablet Take 20 mg by mouth daily. (Patient not taking: Reported on 05/08/2023)     Hypromellose (ARTIFICIAL TEARS OP) Apply 1 drop to eye 4 (four) times daily as needed (dry eyes). (Patient not taking: Reported on 05/08/2023)     ketotifen (ZADITOR) 0.025 % ophthalmic solution Place 1 drop into both eyes 2 (two) times daily as needed (allergies). (Patient not taking: Reported on 05/08/2023)     loratadine  (CLARITIN ) 10 MG tablet Take 10 mg by mouth daily as needed (seasonal allergies).  (Patient not taking: Reported on 05/08/2023)     losartan  (COZAAR ) 50 MG tablet Take 50 mg by mouth daily.     meloxicam (MOBIC) 7.5 MG tablet Take 15 mg by mouth daily as needed. (Patient not taking: Reported on  05/08/2023)     Multiple Vitamin (MULTIVITAMIN WITH MINERALS) TABS tablet Take 1 tablet by mouth daily. (Patient not taking: Reported on 05/08/2023)     oxyCODONE  (OXY IR/ROXICODONE ) 5 MG immediate release tablet Take 1 tablet (5 mg total) by mouth every 6 (six) hours as needed for severe pain (pain score 7-10). 15 tablet 0   Probiotic Product (PROBIOTIC PO) Take by mouth every other day. (Patient not taking: Reported on 05/08/2023)     vitamin C  (ASCORBIC ACID ) 500  MG tablet Take 500 mg by mouth daily.  (Patient not taking: Reported on 05/08/2023)     No current facility-administered medications for this encounter.    Physical Findings: The patient is in no acute distress. Patient is alert and oriented.  vitals were not taken for this visit.  No significant changes. Lungs are clear to auscultation bilaterally. Heart has regular rate and rhythm. No palpable cervical, supraclavicular, or axillary adenopathy. Abdomen soft, non-tender, normal bowel sounds. Left Breast: no palpable mass, nipple discharge or bleeding. Right Breast: ***  Lab Findings: Lab Results  Component Value Date   WBC 6.5 03/07/2017   HGB 14.5 03/07/2017   HCT 43.4 03/07/2017   MCV 95.6 03/07/2017   PLT 308 03/07/2017    Radiographic Findings: MM Breast Surgical Specimen Result Date: 05/13/2023 CLINICAL DATA:  Patient is status post ultrasound-guided biopsy of a RIGHT breast mass which demonstrated invasive ductal carcinoma (COIL clip). Patient underwent seed localization of the mass. EXAM: SPECIMEN RADIOGRAPH OF THE RIGHT BREAST COMPARISON:  Previous exam(s). FINDINGS: Status post excision of the RIGHT breast. The radioactive seed and COIL biopsy marker clip are present, completely intact. IMPRESSION: Specimen radiograph of the RIGHT breast. These results were called by telephone at the time of interpretation on 05/13/2023 at 12:32 pm to OR staff, who verbally acknowledged these results. Electronically Signed   By: Clancy Crimes M.D.   On: 05/13/2023 12:32   MM RT RADIOACTIVE SEED LOC MAMMO GUIDE Result Date: 05/12/2023 CLINICAL DATA:  Patient presents for radioactive seed localization prior to surgical excision of biopsy-proven malignancy over the inner mid to upper right breast marked by coil clip. EXAM: MAMMOGRAPHIC GUIDED RADIOACTIVE SEED LOCALIZATION OF THE RIGHT BREAST COMPARISON:  Previous exam(s). FINDINGS: Patient presents for radioactive seed localization prior to  surgical excision. I met with the patient and we discussed the procedure of seed localization including benefits and alternatives. We discussed the high likelihood of a successful procedure. We discussed the risks of the procedure including infection, bleeding, tissue injury and further surgery. We discussed the low dose of radioactivity involved in the procedure. Informed, written consent was given. The usual time-out protocol was performed immediately prior to the procedure. Using mammographic guidance, sterile technique, 1% lidocaine  and an I-125 radioactive seed, the targeted coil clip over the inner mid to upper right breast was localized using a medial to lateral approach. Mammographic images demonstrate the seed immediately lateral to the clip on the CC image and immediately superior to the clip on the true lateral image. The follow-up mammogram images confirm the seed in the expected location and were marked for Dr. Afton Horse. Follow-up survey of the patient confirms presence of the radioactive seed. Order number of I-125 seed:  [id number]. Total activity:  0.236 millicurie reference Date: 04/10/2023 The patient tolerated the procedure well and was released from the [facility]. She was given instructions regarding seed removal. IMPRESSION: Radioactive seed localization right breast. No apparent complications. Electronically Signed   By: Roda Cirri  M.D.   On: 05/12/2023 15:12   US  RT BREAST BX W LOC DEV 1ST LESION IMG BX SPEC US  GUIDE Addendum Date: 05/01/2023 ADDENDUM REPORT: 05/01/2023 15:59 ADDENDUM: PATHOLOGY revealed: 1. Breast, right, needle core biopsy, 2:30 o'clock 4 cmfn: - INVASIVE DUCTAL CARCINOMA, - OVERALL GRADE: 2 - LYMPHOVASCULAR INVASION: NOT IDENTIFIED - CANCER LENGTH: 0.6 CM - CALCIFICATIONS: NOT IDENTIFIED. Pathology results are CONCORDANT with imaging findings, per Dr. Mercie Stalker. Pathology results and recommendations were discussed with patient via telephone on 05/01/2023 by Ladonna Pickup RN. Patient reported biopsy site doing well with no adverse symptoms, and only slight tenderness at the site. Post biopsy care instructions were reviewed, questions were answered and my direct phone number was provided. Patient was instructed to call [facility] for any additional questions or concerns related to biopsy site. RECOMMENDATION:1. Surgical and oncological consultation. Request for surgical consultation relayed to Alphonse Asal at [facility] on 05/01/2023 by Ladonna Pickup RN. Surgical consultation has been arranged for patient to see Dr. Lawrnce Pretzel at [facility] on 05/02/2023 at 10:00. Pathology results reported by Ladonna Pickup RN on 05/01/2023. Electronically Signed   By: Mercie Stalker M.D.   On: 05/01/2023 15:59   Result Date: 05/01/2023 CLINICAL DATA:  Suspicious 0.7 cm mass at the right breast 2:30 position 4 cm from the nipple. EXAM: ULTRASOUND GUIDED RIGHT BREAST CORE NEEDLE BIOPSY COMPARISON:  Previous exam(s). PROCEDURE: I met with the patient and we discussed the procedure of ultrasound-guided biopsy, including benefits and alternatives. We discussed the high likelihood of a successful procedure. We discussed the risks of the procedure, including infection, bleeding, tissue injury, clip migration, and inadequate sampling. Informed written consent was given. The usual time-out protocol was performed immediately prior to the procedure. Lesion quadrant: Upper inner quadrant Using sterile technique and 1% Lidocaine  as local anesthetic, under direct ultrasound visualization, a 14 gauge spring-loaded device was used to perform biopsy of a 0.7 cm mass at right breast 2:30 position 4 cm from the nipple using an inferior approach. At the conclusion of the procedure a coil shaped tissue marker clip was deployed into the biopsy cavity. Follow up 2 view mammogram was performed and dictated separately. IMPRESSION: Ultrasound guided biopsy of a 0.7 cm mass  at the right breast 2:30 position 4 cm from the nipple. No apparent complications. Electronically Signed: By: Mercie Stalker M.D. On: 04/29/2023 13:41   MM CLIP PLACEMENT RIGHT Result Date: 04/29/2023 CLINICAL DATA:  Postprocedure mammogram. EXAM: 3D DIAGNOSTIC RIGHT MAMMOGRAM POST ULTRASOUND BIOPSY COMPARISON:  Previous exam(s). ACR Breast Density Category b: There are scattered areas of fibroglandular density. FINDINGS: 3D Mammographic images were obtained following ultrasound guided biopsy of a 0.7 cm mass at the right breast 2:30 position 4 cm from the nipple. The biopsy marking clip is in expected position at the site of biopsy. IMPRESSION: Appropriate positioning of the coil shaped biopsy marking clip at the site of biopsy in the central inner right breast at middle depth. Final Assessment: Post Procedure Mammograms for Marker Placement Electronically Signed   By: Mercie Stalker M.D.   On: 04/29/2023 13:40   MM 3D DIAGNOSTIC MAMMOGRAM UNILATERAL RIGHT BREAST Result Date: 04/28/2023 CLINICAL DATA:  Recall from screening to evaluate a possible right breast mass. EXAM: DIGITAL DIAGNOSTIC UNILATERAL RIGHT MAMMOGRAM WITH TOMOSYNTHESIS AND CAD; ULTRASOUND RIGHT BREAST LIMITED TECHNIQUE: Right digital diagnostic mammography and breast tomosynthesis was performed. The images were evaluated with computer-aided detection. ; Targeted ultrasound examination of the  right breast was performed COMPARISON:  Previous exam(s). ACR Breast Density Category b: There are scattered areas of fibroglandular density. FINDINGS: Additional views of the right breast demonstrate a somewhat irregular shaped mass with partial irregular partially obscured borders over the inner midportion of the right breast containing a single coarse calcification. Targeted ultrasound is performed, showing a hypoechoic mass with slight irregular shaped and somewhat irregular and indistinct borders over the 2:30 position of the right breast 4 cm from the  nipple measuring 7 x 7 x 7 mm. There is a single associated microcalcification present. Ultrasound axilla is within normal. IMPRESSION: Slightly suspicious 7 mm mass over the 2:30 position of the right breast. RECOMMENDATION: Recommend ultrasound-guided core needle biopsy for further evaluation. I have discussed the findings and recommendations with the patient. If applicable, a reminder letter will be sent to the patient regarding the next appointment. BI-RADS CATEGORY  4: Suspicious. Biopsy scheduling will be facilitated by the ultrasonographer. Electronically Signed   By: Roda Cirri M.D.   On: 04/28/2023 14:42   US  LIMITED ULTRASOUND INCLUDING AXILLA RIGHT BREAST Result Date: 04/28/2023 CLINICAL DATA:  Recall from screening to evaluate a possible right breast mass. EXAM: DIGITAL DIAGNOSTIC UNILATERAL RIGHT MAMMOGRAM WITH TOMOSYNTHESIS AND CAD; ULTRASOUND RIGHT BREAST LIMITED TECHNIQUE: Right digital diagnostic mammography and breast tomosynthesis was performed. The images were evaluated with computer-aided detection. ; Targeted ultrasound examination of the right breast was performed COMPARISON:  Previous exam(s). ACR Breast Density Category b: There are scattered areas of fibroglandular density. FINDINGS: Additional views of the right breast demonstrate a somewhat irregular shaped mass with partial irregular partially obscured borders over the inner midportion of the right breast containing a single coarse calcification. Targeted ultrasound is performed, showing a hypoechoic mass with slight irregular shaped and somewhat irregular and indistinct borders over the 2:30 position of the right breast 4 cm from the nipple measuring 7 x 7 x 7 mm. There is a single associated microcalcification present. Ultrasound axilla is within normal. IMPRESSION: Slightly suspicious 7 mm mass over the 2:30 position of the right breast. RECOMMENDATION: Recommend ultrasound-guided core needle biopsy for further evaluation. I have  discussed the findings and recommendations with the patient. If applicable, a reminder letter will be sent to the patient regarding the next appointment. BI-RADS CATEGORY  4: Suspicious. Biopsy scheduling will be facilitated by the ultrasonographer. Electronically Signed   By: Roda Cirri M.D.   On: 04/28/2023 14:42    Impression:  Stage IA (cT1b, cN0, cM0) Right Breast LIQ, Invasive Ductal Carcinoma, ER+ / PR+ / Her2-, Grade 2: s/p right breast lumpectomy without nodal biopsies  ***  Plan:  Patient is scheduled for CT simulation {date/later today}. ***  -----------------------------------  Noralee Beam, PhD, MD  This document serves as a record of services personally performed by Retta Caster, MD. It was created on his behalf by Lucky Sable, a trained medical scribe. The creation of this record is based on the scribe's personal observations and the provider's statements to them. This document has been checked and approved by the attending provider.

## 2023-05-28 ENCOUNTER — Ambulatory Visit
Admission: RE | Admit: 2023-05-28 | Discharge: 2023-05-28 | Discharge status: Home / Self Care | Source: Ambulatory Visit | Attending: Radiation Oncology | Admitting: Radiation Oncology

## 2023-05-28 ENCOUNTER — Ambulatory Visit
Admission: RE | Admit: 2023-05-28 | Discharge: 2023-05-28 | Disposition: A | Discharge status: Home / Self Care | Source: Ambulatory Visit | Attending: Radiation Oncology | Admitting: Radiation Oncology

## 2023-05-28 ENCOUNTER — Encounter: Payer: Self-pay | Admitting: Radiation Oncology

## 2023-05-28 VITALS — BP 151/80 | HR 81 | Temp 97.4°F | Resp 20 | Ht 62.0 in | Wt 161.2 lb

## 2023-05-28 DIAGNOSIS — Z7982 Long term (current) use of aspirin: Secondary | ICD-10-CM | POA: Diagnosis not present

## 2023-05-28 DIAGNOSIS — Z79899 Other long term (current) drug therapy: Secondary | ICD-10-CM | POA: Insufficient documentation

## 2023-05-28 DIAGNOSIS — C50311 Malignant neoplasm of lower-inner quadrant of right female breast: Secondary | ICD-10-CM

## 2023-05-28 DIAGNOSIS — Z923 Personal history of irradiation: Secondary | ICD-10-CM | POA: Insufficient documentation

## 2023-05-28 DIAGNOSIS — Z170 Estrogen receptor positive status [ER+]: Secondary | ICD-10-CM | POA: Insufficient documentation

## 2023-06-02 ENCOUNTER — Encounter: Payer: Self-pay | Admitting: Obstetrics and Gynecology

## 2023-06-02 ENCOUNTER — Encounter: Payer: Self-pay | Admitting: *Deleted

## 2023-06-02 DIAGNOSIS — C50311 Malignant neoplasm of lower-inner quadrant of right female breast: Secondary | ICD-10-CM

## 2023-06-03 ENCOUNTER — Ambulatory Visit
Admission: RE | Admit: 2023-06-03 | Discharge: 2023-06-03 | Disposition: A | Discharge status: Home / Self Care | Source: Ambulatory Visit | Attending: Radiation Oncology | Admitting: Radiation Oncology

## 2023-06-03 DIAGNOSIS — Z1721 Progesterone receptor positive status: Secondary | ICD-10-CM | POA: Insufficient documentation

## 2023-06-03 DIAGNOSIS — Z1732 Human epidermal growth factor receptor 2 negative status: Secondary | ICD-10-CM | POA: Diagnosis not present

## 2023-06-03 DIAGNOSIS — C50311 Malignant neoplasm of lower-inner quadrant of right female breast: Secondary | ICD-10-CM | POA: Diagnosis not present

## 2023-06-03 DIAGNOSIS — Z510 Encounter for antineoplastic radiation therapy: Secondary | ICD-10-CM | POA: Diagnosis present

## 2023-06-03 DIAGNOSIS — Z170 Estrogen receptor positive status [ER+]: Secondary | ICD-10-CM | POA: Insufficient documentation

## 2023-06-04 ENCOUNTER — Ambulatory Visit: Payer: Self-pay | Admitting: *Deleted

## 2023-06-04 DIAGNOSIS — M8000XA Age-related osteoporosis with current pathological fracture, unspecified site, initial encounter for fracture: Secondary | ICD-10-CM

## 2023-06-04 NOTE — Telephone Encounter (Signed)
 Patient called Last dxa 2024 with osteopenia with positive frax Started fosamax one year ago in May To get repeat bone scan (she goes to [facility]) to see if any changes ie good or bad on the medication. She is in agreement. Patient to call and schedule and notify us  if any problems Asked patient to request I receive the results at [facility]   Patient is not having any problems on the fosamax.  Dr. Tia Flowers

## 2023-06-04 NOTE — Telephone Encounter (Signed)
-----   Message from Nurse Nelva Bang sent at 06/04/2023  8:14 AM EDT ----- See MyChart message from patient. Patient currently taking Fosamax-- do you want her to wait until November to start Evenity or okay to precert and begin now?

## 2023-06-06 DIAGNOSIS — Z510 Encounter for antineoplastic radiation therapy: Secondary | ICD-10-CM | POA: Diagnosis not present

## 2023-06-10 ENCOUNTER — Ambulatory Visit
Admission: RE | Admit: 2023-06-10 | Discharge: 2023-06-10 | Disposition: A | Discharge status: Home / Self Care | Source: Ambulatory Visit | Attending: Radiation Oncology | Admitting: Radiation Oncology

## 2023-06-10 ENCOUNTER — Other Ambulatory Visit: Payer: Self-pay

## 2023-06-10 DIAGNOSIS — C50311 Malignant neoplasm of lower-inner quadrant of right female breast: Secondary | ICD-10-CM

## 2023-06-10 DIAGNOSIS — Z510 Encounter for antineoplastic radiation therapy: Secondary | ICD-10-CM | POA: Diagnosis not present

## 2023-06-10 LAB — RAD ONC ARIA SESSION SUMMARY
Course Elapsed Days: 0
Plan Fractions Treated to Date: 1
Plan Prescribed Dose Per Fraction: 5.7 Gy
Plan Total Fractions Prescribed: 5
Plan Total Prescribed Dose: 28.5 Gy
Reference Point Dosage Given to Date: 5.7 Gy
Reference Point Session Dosage Given: 5.7 Gy
Session Number: 1

## 2023-06-10 MED ORDER — RADIAPLEXRX EX GEL: Freq: Once | CUTANEOUS | Status: AC

## 2023-06-10 MED ORDER — ALRA NON-METALLIC DEODORANT (RAD-ONC)
1.0000 | Freq: Once | TOPICAL | Status: AC
  Administered 2023-06-10: 1 via TOPICAL

## 2023-06-12 ENCOUNTER — Telehealth: Payer: Self-pay | Admitting: *Deleted

## 2023-06-12 NOTE — Telephone Encounter (Signed)
 Patient left voicemail on Triage line stating she just went to [facility] for DEXA scan and they told her it would not be covered by insurance that she would need to wait 2 years in between, so was not done. Patient calling to ask what you would like her to do now?

## 2023-06-17 ENCOUNTER — Ambulatory Visit
Admission: RE | Admit: 2023-06-17 | Discharge: 2023-06-17 | Disposition: A | Discharge status: Home / Self Care | Source: Ambulatory Visit | Attending: Radiation Oncology | Admitting: Radiation Oncology

## 2023-06-17 ENCOUNTER — Other Ambulatory Visit: Payer: Self-pay

## 2023-06-17 DIAGNOSIS — Z510 Encounter for antineoplastic radiation therapy: Secondary | ICD-10-CM | POA: Diagnosis not present

## 2023-06-17 LAB — RAD ONC ARIA SESSION SUMMARY
Course Elapsed Days: 7
Plan Fractions Treated to Date: 2
Plan Prescribed Dose Per Fraction: 5.7 Gy
Plan Total Fractions Prescribed: 5
Plan Total Prescribed Dose: 28.5 Gy
Reference Point Dosage Given to Date: 11.4 Gy
Reference Point Session Dosage Given: 5.7 Gy
Session Number: 2

## 2023-06-20 ENCOUNTER — Telehealth: Payer: Self-pay | Admitting: Hematology and Oncology

## 2023-06-20 NOTE — Telephone Encounter (Signed)
 Patient does not want to follow up with Dr.Iruku.

## 2023-06-24 ENCOUNTER — Ambulatory Visit

## 2023-06-24 ENCOUNTER — Other Ambulatory Visit: Payer: Self-pay

## 2023-06-24 ENCOUNTER — Ambulatory Visit
Admission: RE | Admit: 2023-06-24 | Discharge: 2023-06-24 | Disposition: A | Discharge status: Home / Self Care | Source: Ambulatory Visit | Attending: Radiation Oncology | Admitting: Radiation Oncology

## 2023-06-24 DIAGNOSIS — C50311 Malignant neoplasm of lower-inner quadrant of right female breast: Secondary | ICD-10-CM | POA: Insufficient documentation

## 2023-06-24 DIAGNOSIS — Z1721 Progesterone receptor positive status: Secondary | ICD-10-CM | POA: Diagnosis not present

## 2023-06-24 DIAGNOSIS — Z170 Estrogen receptor positive status [ER+]: Secondary | ICD-10-CM | POA: Diagnosis not present

## 2023-06-24 DIAGNOSIS — Z1732 Human epidermal growth factor receptor 2 negative status: Secondary | ICD-10-CM | POA: Diagnosis not present

## 2023-06-24 DIAGNOSIS — Z510 Encounter for antineoplastic radiation therapy: Secondary | ICD-10-CM | POA: Insufficient documentation

## 2023-06-24 LAB — RAD ONC ARIA SESSION SUMMARY
Course Elapsed Days: 14
Plan Fractions Treated to Date: 3
Plan Prescribed Dose Per Fraction: 5.7 Gy
Plan Total Fractions Prescribed: 5
Plan Total Prescribed Dose: 28.5 Gy
Reference Point Dosage Given to Date: 17.1 Gy
Reference Point Session Dosage Given: 5.7 Gy
Session Number: 3

## 2023-07-01 ENCOUNTER — Ambulatory Visit

## 2023-07-01 ENCOUNTER — Ambulatory Visit
Admission: RE | Admit: 2023-07-01 | Discharge: 2023-07-01 | Disposition: A | Discharge status: Home / Self Care | Source: Ambulatory Visit | Attending: Radiation Oncology | Admitting: Radiation Oncology

## 2023-07-01 ENCOUNTER — Other Ambulatory Visit: Payer: Self-pay

## 2023-07-01 DIAGNOSIS — Z510 Encounter for antineoplastic radiation therapy: Secondary | ICD-10-CM | POA: Diagnosis not present

## 2023-07-01 LAB — RAD ONC ARIA SESSION SUMMARY
Course Elapsed Days: 21
Plan Fractions Treated to Date: 4
Plan Prescribed Dose Per Fraction: 5.7 Gy
Plan Total Fractions Prescribed: 5
Plan Total Prescribed Dose: 28.5 Gy
Reference Point Dosage Given to Date: 22.8 Gy
Reference Point Session Dosage Given: 5.7 Gy
Session Number: 4

## 2023-07-07 ENCOUNTER — Telehealth: Payer: Self-pay | Admitting: Obstetrics and Gynecology

## 2023-07-07 DIAGNOSIS — E2839 Other primary ovarian failure: Secondary | ICD-10-CM

## 2023-07-07 NOTE — Telephone Encounter (Signed)
 Bone scan referral

## 2023-07-08 ENCOUNTER — Other Ambulatory Visit: Payer: Self-pay

## 2023-07-08 ENCOUNTER — Ambulatory Visit
Admission: RE | Admit: 2023-07-08 | Discharge: 2023-07-08 | Disposition: A | Discharge status: Home / Self Care | Source: Ambulatory Visit | Attending: Radiation Oncology | Admitting: Radiation Oncology

## 2023-07-08 DIAGNOSIS — Z510 Encounter for antineoplastic radiation therapy: Secondary | ICD-10-CM | POA: Diagnosis not present

## 2023-07-08 LAB — RAD ONC ARIA SESSION SUMMARY
Course Elapsed Days: 28
Plan Fractions Treated to Date: 5
Plan Prescribed Dose Per Fraction: 5.7 Gy
Plan Total Fractions Prescribed: 5
Plan Total Prescribed Dose: 28.5 Gy
Reference Point Dosage Given to Date: 28.5 Gy
Reference Point Session Dosage Given: 5.7 Gy
Session Number: 5

## 2023-07-09 NOTE — Radiation Completion Notes (Addendum)
  Radiation Oncology         [phone]) [phone] ________________________________  Name: Ashley Cooke MRN: [id number]  Date of Service: 07/08/2023  DOB: [birth year]  End of Treatment Note  Diagnosis: Stage IA (cT1b, cN0, cM0) Right Breast LIQ, Invasive Ductal Carcinoma, ER+ / PR+ / Her2-  Intent: Curative     ==========DELIVERED PLANS==========  First Treatment Date: 2023-06-10 Last Treatment Date: 2023-07-08   Plan Name: Breast_R Site: Breast, Right Technique: 3D Mode: Photon Dose Per Fraction: 5.7 Gy Prescribed Dose (Delivered / Prescribed): 28.5 Gy / 28.5 Gy Prescribed Fxs (Delivered / Prescribed): 5 / 5     ====================================   The patient tolerated radiation very well. She developed fatigue during her treatment, but otherwise developed no other side effects.   The patient will return in one month and we will determine follow-up plans at that time.      Ronita Due, PA-C

## 2023-07-15 ENCOUNTER — Telehealth: Payer: Self-pay | Admitting: Hematology and Oncology

## 2023-07-15 NOTE — Telephone Encounter (Signed)
 Pt stated that she scheduled follow up appointments with Dr. Shannon and plan not to do any other treatment. Did not to schedule wit Dr. Loretha

## 2023-08-06 NOTE — Progress Notes (Signed)
 Radiation Oncology         (336) [phone] ________________________________  Name: Ashley Cooke MRN: [id number]  Date: 08/07/2023  DOB: 04-21-1940  Follow-Up Visit Note  CC: Tisovec, Charlie ORN, MD  Tisovec, Charlie ORN, MD  No diagnosis found.  Diagnosis:   Stage IA (cT1b, cN0, cM0) Right Breast LIQ, Invasive Ductal Carcinoma, ER+ / PR+ / Her2-    Cancer Staging  Malignant neoplasm of lower-inner quadrant of female breast [facility]) Staging form: Breast, AJCC 8th Edition - Clinical stage from 05/07/2023: Stage IA (cT1b, cN0, cM0, G2, ER+, PR+, HER2-) - Signed by [name], MD on 05/07/2023  Interval Since Last Radiation:  1 month  Intent: curative  First Treatment Date: 2023-06-10 Last Treatment Date: 2023-07-08   Plan Name: Breast_R Site: Breast, Right Technique: 3D Mode: Photon Dose Per Fraction: 5.7 Gy Prescribed Dose (Delivered / Prescribed): 28.5 Gy / 28.5 Gy Prescribed Fxs (Delivered / Prescribed): 5 / 5  Narrative:  The patient returns today for routine follow-up. She was last seen in office on 05/28/23 for a follow up visit. Since then, patient completed her radiation treatment which she tolerated quite well. Patient did however endorse experiencing fatigue during the course of her treatment.    In the interval since she was last seen, she presented for a follow up visit with Dr. Vanderbilt on 06/09/23 for a post-op visit during which she reported doing well overall and was noted NED on exam. Her procedure site did not indicate presence of any infections and was recovering accordingly.  No other significant oncologic interval history since the patient was last seen.                                  Allergies:  has no known allergies.  Meds: Current Outpatient Medications  Medication Sig Dispense Refill   acetaminophen  (TYLENOL ) 500 MG tablet Take 1,000 mg by mouth every 6 (six) hours as needed.     alendronate (FOSAMAX) 70 MG tablet Take by mouth.     aspirin  EC 81 MG  tablet Take 81 mg by mouth daily.     atorvastatin  (LIPITOR) 10 MG tablet Take 10 mg by mouth every evening.     Calcium  Carbonate-Vitamin D  (CALCIUM  600+D PO) Take 1 tablet by mouth daily.     cholecalciferol  (VITAMIN D ) 1000 UNITS tablet Take 1,000 Units by mouth daily.     Docusate Calcium  (STOOL SOFTENER PO) Take by mouth.     famotidine  (PEPCID ) 20 MG tablet Take 20 mg by mouth daily.     Hypromellose (ARTIFICIAL TEARS OP) Apply 1 drop to eye 4 (four) times daily as needed (dry eyes).     ketotifen (ZADITOR) 0.025 % ophthalmic solution Place 1 drop into both eyes 2 (two) times daily as needed (allergies). (Patient not taking: Reported on 05/08/2023)     loratadine  (CLARITIN ) 10 MG tablet Take 10 mg by mouth daily as needed (seasonal allergies).  (Patient not taking: Reported on 05/08/2023)     losartan  (COZAAR ) 50 MG tablet Take 50 mg by mouth daily.     meloxicam (MOBIC) 7.5 MG tablet Take 15 mg by mouth daily as needed.     Multiple Vitamin (MULTIVITAMIN WITH MINERALS) TABS tablet Take 1 tablet by mouth daily.     oxyCODONE  (OXY IR/ROXICODONE ) 5 MG immediate release tablet Take 1 tablet (5 mg total) by mouth every 6 (six) hours as needed for severe  pain (pain score 7-10). (Patient not taking: Reported on 05/28/2023) 15 tablet 0   Probiotic Product (PROBIOTIC PO) Take by mouth every other day.     vitamin C  (ASCORBIC ACID ) 500 MG tablet Take 500 mg by mouth daily.     No current facility-administered medications for this encounter.    Physical Findings: The patient is in no acute distress. Patient is alert and oriented.  vitals were not taken for this visit. .  No significant changes. Lungs are clear to auscultation bilaterally. Heart has regular rate and rhythm. No palpable cervical, supraclavicular, or axillary adenopathy. Abdomen soft, non-tender, normal bowel sounds.   Lab Findings: Lab Results  Component Value Date   WBC 6.5 03/07/2017   HGB 14.5 03/07/2017   HCT 43.4 03/07/2017    MCV 95.6 03/07/2017   PLT 308 03/07/2017    Radiographic Findings: No results found.  Impression: Stage IA (cT1b, cN0, cM0) Right Breast LIQ, Invasive Ductal Carcinoma, ER+ / PR+ / Her2-   The patient is recovering from the effects of radiation.  ***  Plan:  ***   *** minutes of total time was spent for this patient encounter, including preparation, face-to-face counseling with the patient and coordination of care, physical exam, and documentation of the encounter. ____________________________________  Lynwood CHARM Nasuti, PhD, MD  This document serves as a record of services personally performed by Lynwood Nasuti, MD. It was created on his behalf by Reymundo Cartwright, a trained medical scribe. The creation of this record is based on the scribe's personal observations and the provider's statements to them. This document has been checked and approved by the attending provider.

## 2023-08-07 ENCOUNTER — Encounter: Payer: Self-pay | Admitting: Radiation Oncology

## 2023-08-07 ENCOUNTER — Ambulatory Visit: Admitting: Radiation Oncology

## 2023-08-07 ENCOUNTER — Ambulatory Visit: Admitting: Radiology

## 2023-08-07 ENCOUNTER — Ambulatory Visit
Admission: RE | Admit: 2023-08-07 | Discharge: 2023-08-07 | Disposition: A | Discharge status: Home / Self Care | Source: Ambulatory Visit | Attending: Radiation Oncology | Admitting: Radiation Oncology

## 2023-08-07 VITALS — BP 127/72 | HR 70 | Temp 96.4°F | Resp 18 | Ht 60.0 in | Wt 165.4 lb

## 2023-08-07 DIAGNOSIS — C50311 Malignant neoplasm of lower-inner quadrant of right female breast: Secondary | ICD-10-CM

## 2023-08-07 NOTE — Progress Notes (Addendum)
 Ashley Cooke is here today for follow up post radiation to the breast.   Breast Side:Right   They completed their radiation on: 07/08/23   Does the patient complain of any of the following: Post radiation skin issues: Patient denies Breast Tenderness: When touching Breast Swelling:  Patient denies Lymphadema: Patient denies Range of Motion limitations: Patient denies, but states that she had a shoulder replacement in 2018 Fatigue post radiation: Yes, moderate fatigue. Appetite good/fair/poor: Good Energy level: Medium  Additional comments if applicable: She is ready to get her stamina back. She plays tennis and swims but still is very tired.

## 2023-09-29 ENCOUNTER — Ambulatory Visit: Payer: Self-pay | Admitting: Obstetrics and Gynecology

## 2023-09-29 ENCOUNTER — Ambulatory Visit: Admitting: Obstetrics and Gynecology

## 2023-11-13 ENCOUNTER — Other Ambulatory Visit: Payer: Self-pay

## 2023-11-13 ENCOUNTER — Inpatient Hospital Stay: Attending: Adult Health | Admitting: Adult Health

## 2023-11-13 ENCOUNTER — Encounter: Payer: Self-pay | Admitting: Adult Health

## 2023-11-13 VITALS — BP 129/64 | HR 79 | Temp 97.3°F | Resp 16 | Wt 166.8 lb

## 2023-11-13 DIAGNOSIS — Z1721 Progesterone receptor positive status: Secondary | ICD-10-CM | POA: Insufficient documentation

## 2023-11-13 DIAGNOSIS — C50311 Malignant neoplasm of lower-inner quadrant of right female breast: Secondary | ICD-10-CM | POA: Diagnosis present

## 2023-11-13 DIAGNOSIS — Z923 Personal history of irradiation: Secondary | ICD-10-CM | POA: Insufficient documentation

## 2023-11-13 DIAGNOSIS — Z87891 Personal history of nicotine dependence: Secondary | ICD-10-CM | POA: Insufficient documentation

## 2023-11-13 DIAGNOSIS — Z1379 Encounter for other screening for genetic and chromosomal anomalies: Secondary | ICD-10-CM

## 2023-11-13 DIAGNOSIS — Z1732 Human epidermal growth factor receptor 2 negative status: Secondary | ICD-10-CM | POA: Insufficient documentation

## 2023-11-13 DIAGNOSIS — Z170 Estrogen receptor positive status [ER+]: Secondary | ICD-10-CM | POA: Diagnosis not present

## 2023-11-13 NOTE — Progress Notes (Signed)
 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)   05/07/2023 Cancer Staging   Staging form: Breast, AJCC 8th Edition - Clinical stage from 05/07/2023: Stage IA (cT1b, cN0, cM0, G2, ER+, PR+, HER2-) - Signed by [name], MD on 05/07/2023 Stage prefix: Initial diagnosis Histologic grading system: 3 grade system   06/10/2023 - 07/08/2023 Radiation Therapy   Plan Name: Breast_R Site: Breast, Right Technique: 3D Mode: Photon Dose Per Fraction: 5.7 Gy Prescribed Dose (Delivered / Prescribed): 28.5 Gy / 28.5 Gy Prescribed Fxs (Delivered / Prescribed): 5 / 5     INTERVAL HISTORY:   Discussed the use of AI scribe software for clinical note transcription with the patient, who gave verbal consent to proceed.  History of Present Illness Ashley Cooke is an 83 year old female with stage 1A breast cancer who presents for follow-up after radiation therapy.  She is recovering well post-surgery but experiences fatigue, which is slowly improving. Her breast cancer was estrogen and progesterone positive, treated with lumpectomy and radiation therapy. She has some residual soreness and occasional twinges in the breast area.  She experiences constipation with infrequent bowel movements, occurring two to three times a week, starting about a month after completing radiation therapy in mid-June. She denies current use of oxycodone .    REVIEW OF SYSTEMS:   Review of Systems  Constitutional:  Positive for fatigue. Negative for appetite change, chills, fever and unexpected weight change.  HENT:   Negative for hearing loss, lump/mass and trouble swallowing.   Eyes:  Negative for eye problems and icterus.  Respiratory:  Negative for chest tightness, cough and shortness of breath.   Cardiovascular:  Negative for chest pain, leg swelling and palpitations.  Gastrointestinal:  Positive for constipation. Negative for abdominal distention, abdominal pain, diarrhea, nausea and vomiting.  Endocrine: Negative for hot flashes.  Genitourinary:  Negative for difficulty urinating.   Musculoskeletal:  Negative for arthralgias.  Skin:  Negative for itching and rash.  Neurological:  Negative for dizziness, extremity weakness, headaches and numbness.  Hematological:  Negative for adenopathy. Does not bruise/bleed easily.  Psychiatric/Behavioral:  Negative for depression. The patient is not nervous/anxious.    Breast: Denies any new nodularity, masses, tenderness, nipple changes, or nipple discharge.       PAST MEDICAL/SURGICAL HISTORY:  Past Medical History:  Diagnosis Date   Arthritis    primarily in my knee & thumbs (10/15/2014)   Hypercholesterolemia    Hypertension    Melanoma of lower leg (HCC) 05/2003   left   Osteopenia    Seasonal allergies    Squamous carcinoma    all on the left side:  under eye, nostril, bicept   Past Surgical History:  Procedure Laterality Date   BREAST BIOPSY Right 04/29/2023   US  RT BREAST BX W LOC DEV 1ST LESION IMG BX SPEC US  GUIDE 04/29/2023 [facility]   BREAST BIOPSY  05/12/2023   MM RT RADIOACTIVE SEED LOC MAMMO GUIDE 05/12/2023 [facility]AMMO[facility]   BREAST LUMPECTOMY WITH  RADIOACTIVE SEED LOCALIZATION Right 05/13/2023   Procedure: BREAST LUMPECTOMY WITH RADIOACTIVE SEED LOCALIZATION;  Surgeon: Vanderbilt Ned, MD;  Location: [facility];  Service: General;  Laterality: Right;  RIGHT BREAST  SEED LUMPECTOMY   cataract Bilateral 05/21/2017   COLONOSCOPY     DILATION AND CURETTAGE OF UTERUS  1968   JOINT REPLACEMENT Bilateral    knee-     KNEE ARTHROSCOPY Left 05/06/2012   torn miniscus   MELANOMA EXCISION Left 05/2003   leg   MOHS SURGERY Left 2000's   squamous; under eye and nostril   SQUAMOUS CELL CARCINOMA EXCISION Left 2008   upper bicept   TONSILLECTOMY AND ADENOIDECTOMY  1949   TOTAL KNEE ARTHROPLASTY Left 10/14/2014   TOTAL KNEE ARTHROPLASTY Left 10/14/2014   Procedure: TOTAL KNEE ARTHROPLASTY;  Surgeon: Toribio Silos, MD;  Location: [facility] OR;  Service: Orthopedics;  Laterality: Left;   TOTAL KNEE ARTHROPLASTY Right 02/10/2015   Procedure: RIGHT TOTAL KNEE ARTHROPLASTY;  Surgeon: Toribio Silos, MD;  Location: [facility] OR;  Service: Orthopedics;  Laterality: Right;   TOTAL SHOULDER ARTHROPLASTY Right 08/13/2016   Procedure: TOTAL SHOULDER ARTHROPLASTY;  Surgeon: Beverley Evalene BIRCH, MD;  Location: [facility] OR;  Service: Orthopedics;  Laterality: Right;   TOTAL SHOULDER ARTHROPLASTY Left 03/18/2017   Procedure: TOTAL SHOULDER ARTHROPLASTY;  Surgeon: Beverley Evalene BIRCH, MD;  Location: [facility] OR;  Service: Orthopedics;  Laterality: Left;     ALLERGIES:  No Known Allergies   CURRENT MEDICATIONS:  Outpatient Encounter Medications as of 11/13/2023  Medication Sig   acetaminophen  (TYLENOL ) 500 MG tablet Take 1,000 mg by mouth every 6 (six) hours as needed.   alendronate (FOSAMAX) 70 MG tablet Take by mouth.   aspirin  EC 81 MG tablet Take 81 mg by mouth daily.   atorvastatin  (LIPITOR) 10 MG tablet Take 10 mg by mouth every evening.   Calcium  Carbonate-Vitamin D  (CALCIUM  600+D PO) Take 1 tablet by mouth daily.   cholecalciferol  (VITAMIN D ) 1000 UNITS tablet Take 1,000 Units by mouth daily.   Docusate Calcium  (STOOL SOFTENER PO) Take by mouth.   famotidine  (PEPCID ) 20 MG tablet Take 20 mg by mouth daily.   Hypromellose (ARTIFICIAL TEARS OP) Apply 1 drop to eye 4 (four) times daily as needed  (dry eyes).   ketotifen (ZADITOR) 0.025 % ophthalmic solution Place 1 drop into both eyes 2 (two) times daily as needed (allergies).   loratadine  (CLARITIN ) 10 MG tablet Take 10 mg by mouth daily as needed (seasonal allergies).    losartan  (COZAAR ) 50 MG tablet Take 50 mg by mouth daily.   meloxicam (MOBIC) 7.5 MG tablet Take 15 mg by mouth daily as needed.   Multiple Vitamin (MULTIVITAMIN WITH MINERALS) TABS tablet Take 1 tablet by mouth daily.   Probiotic Product (PROBIOTIC PO) Take by mouth every other day.   vitamin C  (ASCORBIC ACID ) 500 MG tablet Take 500 mg by mouth daily.   [DISCONTINUED] oxyCODONE  (OXY IR/ROXICODONE ) 5 MG immediate release tablet Take 1 tablet (5 mg total) by mouth every 6 (six) hours as needed for severe pain (pain score 7-10). (Patient not taking: Reported on 11/13/2023)   No facility-administered encounter medications on file as of 11/13/2023.     ONCOLOGIC FAMILY HISTORY:  Family History  Problem Relation Age of Onset   Hypertension Mother    Hypertension Sister 21   Peripheral Artery Disease Sister    Hypertension Maternal Grandfather    Colon cancer Neg Hx    Breast cancer Neg Hx  BRCA 1/2 Neg Hx      SOCIAL HISTORY:  Social History   Socioeconomic History   Marital status: Married    Spouse name: Not on file   Number of children: Not on file   Years of education: Not on file   Highest education level: Not on file  Occupational History   Not on file  Tobacco Use   Smoking status: Former    Current packs/day: 0.00    Average packs/day: 0.5 packs/day for 22.0 years (11.0 ttl pk-yrs)    Types: Cigarettes    Start date: 06/21/1957    Quit date: 06/22/1979    Years since quitting: 44.4   Smokeless tobacco: Never  Vaping Use   Vaping status: Never Used  Substance and Sexual Activity   Alcohol  use: Yes    Alcohol /week: 7.0 standard drinks of alcohol     Types: 7 Glasses of wine per week   Drug use: No   Sexual activity: Not Currently     Partners: Male    Birth control/protection: Post-menopausal    Comment: less than 5, older than 16  Other Topics Concern   Not on file  Social History Narrative   Not on file   Social Drivers of Health   Financial Resource Strain: Not on file  Food Insecurity: No Food Insecurity (05/28/2023)   Hunger Vital Sign    Worried About Running Out of Food in the Last Year: Never true    Ran Out of Food in the Last Year: Never true  Transportation Needs: No Transportation Needs (05/28/2023)   PRAPARE - Administrator, Civil Service (Medical): No    Lack of Transportation (Non-Medical): No  Physical Activity: Not on file  Stress: Not on file  Social Connections: Not on file  Intimate Partner Violence: Not At Risk (05/28/2023)   Humiliation, Afraid, Rape, and Kick questionnaire    Fear of Current or Ex-Partner: No    Emotionally Abused: No    Physically Abused: No    Sexually Abused: No     OBSERVATIONS/OBJECTIVE:  BP 129/64 (BP Location: Left Arm, Patient Position: Sitting)   Pulse 79   Temp (!) 97.3 F (36.3 C) (Temporal)   Resp 16   Wt 166 lb 12.8 oz (75.7 kg)   LMP 01/21/1977 (Approximate)   SpO2 99%   BMI 32.58 kg/m  GENERAL: Patient is a well appearing female in no acute distress HEENT:  Sclerae anicteric.  Oropharynx clear and moist. No ulcerations or evidence of oropharyngeal candidiasis. Neck is supple.  NODES:  No cervical, supraclavicular, or axillary lymphadenopathy palpated.  BREAST EXAM:  right breast s/p lumpectomy and radiation, no sign of local recurrence, left breast benign LUNGS:  Clear to auscultation bilaterally.  No wheezes or rhonchi. HEART:  Regular rate and rhythm. No murmur appreciated. ABDOMEN:  Soft, nontender.  Positive, normoactive bowel sounds. No organomegaly palpated. MSK:  No focal spinal tenderness to palpation. Full range of motion bilaterally in the upper extremities. EXTREMITIES:  No peripheral edema.   SKIN:  Clear with no obvious  rashes or skin changes. No nail dyscrasia. NEURO:  Nonfocal. Well oriented.  Appropriate affect.   LABORATORY DATA:  None for this visit.  DIAGNOSTIC IMAGING:  None for this visit.      ASSESSMENT AND PLAN:  Ashley Cooke is a pleasant 83 y.o. female with Stage IA right breast invasive ductal carcinoma, ER+/PR+/HER2-, diagnosed in 04/2023, treated with lumpectomy, adjuvant radiation therapy.  She presents to the Survivorship  Clinic for our initial meeting and routine follow-up post-completion of treatment for breast cancer.    1. Stage IA right breast cancer:  Ms. Pellicano is continuing to recover from definitive treatment for breast cancer. She has opted to forego antiestrogen therapy.  She would like to undergo guardant reveal blood testing.  She would like to receive her f/u care from Dr. Cornett alone every 6 months at [facility].    Her mammogram is due 03/2024; orders placed today.   Today, a comprehensive survivorship care plan and treatment summary was reviewed with the patient today detailing her breast cancer diagnosis, treatment course, potential late/long-term effects of treatment, appropriate follow-up care with recommendations for the future, and patient education resources.  A copy of this summary, along with a letter will be sent to the patient's primary care provider via mail/fax/In Basket message after today's visit.    2. Bone health:  She was given education on specific activities to promote bone health.  3. Cancer screening:  Due to Ms. Piccininni's history and her age, she should receive screening for skin cancers, colon cancer, and gynecologic cancers.  The information and recommendations are listed on the patient's comprehensive care plan/treatment summary and were reviewed in detail with the patient.    4. Health maintenance and wellness promotion: Ms. Strohman was encouraged to consume 5-7 servings of fruits and vegetables per day. We reviewed the [employer]  handout.  She was also encouraged to engage in moderate to vigorous exercise for 30 minutes per day most days of the week.  She was instructed to limit her alcohol  consumption and continue to abstain from tobacco use.    5. Support services/counseling: It is not uncommon for this period of the patient's cancer care trajectory to be one of many emotions and stressors.   She was given information regarding our available services and encouraged to contact me with any questions or for help enrolling in any of our support group/programs.    Follow up instructions:    -Return to cancer center PRN -Guardant reveal blood testing ordered -Dr. Vanderbilt agreeable to see Jenkins every 6 months.    -Mammogram due in 03/2024 -She is welcome to return back to the Survivorship Clinic at any time; no additional follow-up needed at this time.  -Consider referral back to survivorship as a long-term survivor for continued surveillance  The patient was provided an opportunity to ask questions and all were answered. The patient agreed with the plan and demonstrated an understanding of the instructions.   Total encounter time:30 minutes*in face-to-face visit time, chart review, lab review, care coordination, order entry, and documentation of the encounter time.    Morna Kendall, NP 11/13/23 2:04 PM Medical Oncology and Hematology [facility] [address] [place], [place] [address] Tel. [phone]    Fax. [phone]  *Total Encounter Time a/s defined by the Centers for Medicare and Medicaid Services includes, in addition to the face-to-face time of a patient visit (documented in the note above) non-face-to-face time: obtaining and reviewing outside history, ordering and reviewing medications, tests or procedures, care coordination (communications with other health care professionals or caregivers) and documentation in the medical record.

## 2023-12-09 ENCOUNTER — Encounter: Payer: Self-pay | Admitting: Adult Health

## 2023-12-23 ENCOUNTER — Telehealth: Payer: Self-pay

## 2023-12-23 ENCOUNTER — Ambulatory Visit: Admitting: Obstetrics and Gynecology

## 2023-12-23 ENCOUNTER — Encounter: Payer: Self-pay | Admitting: Obstetrics and Gynecology

## 2023-12-23 VITALS — BP 133/71 | HR 75 | Ht 63.0 in | Wt 163.0 lb

## 2023-12-23 DIAGNOSIS — R350 Frequency of micturition: Secondary | ICD-10-CM

## 2023-12-23 DIAGNOSIS — R159 Full incontinence of feces: Secondary | ICD-10-CM | POA: Diagnosis not present

## 2023-12-23 DIAGNOSIS — N393 Stress incontinence (female) (male): Secondary | ICD-10-CM | POA: Diagnosis not present

## 2023-12-23 DIAGNOSIS — R351 Nocturia: Secondary | ICD-10-CM | POA: Diagnosis not present

## 2023-12-23 DIAGNOSIS — R222 Localized swelling, mass and lump, trunk: Secondary | ICD-10-CM | POA: Insufficient documentation

## 2023-12-23 LAB — POCT URINALYSIS DIP (CLINITEK)
Bilirubin, UA: NEGATIVE
Blood, UA: NEGATIVE
Glucose, UA: NEGATIVE mg/dL
Ketones, POC UA: NEGATIVE mg/dL
Leukocytes, UA: NEGATIVE
Nitrite, UA: NEGATIVE
POC PROTEIN,UA: NEGATIVE
Spec Grav, UA: 1.015 (ref 1.010–1.025)
Urobilinogen, UA: 0.2 U/dL
pH, UA: 6 (ref 5.0–8.0)

## 2023-12-23 NOTE — Patient Instructions (Addendum)
 Accidental Bowel Leakage: Our goal is to achieve formed bowel movements daily or every-other-day without leakage.  You may need to try different combinations of the following options to find what works best for you.  Some management options include: Dietary changes (more leafy greens, vegetables and fruits; less processed foods) Fiber supplementation (Metamucil or something with psyllium as active ingredient, or Benefiber)- use daily to help prevent loose stools Over-the-counter imodium (tablets or liquid) to help solidify the stool and prevent leakage of stool.  You have a stage 2 (out of 4) prolapse.  We discussed the fact that it is not life threatening but there are several treatment options. For treatment of pelvic organ prolapse, we discussed options for management including expectant management, conservative management, and surgical management, such as Kegels, a pessary, pelvic floor physical therapy, and specific surgical procedures.

## 2023-12-23 NOTE — Assessment & Plan Note (Signed)
-   Will order CT due to 4cm abdominal wall nodule and abdominal distention.  - Messaged NP at oncology survivorship clinic regarding follow up

## 2023-12-23 NOTE — Telephone Encounter (Signed)
 Encounter Note  Action Taken: Offered patient an appointment for CT scan review.  Plan: Scheduled pt for 02/02/2024 @ 0920 to see Morna Kendall to review Scans

## 2023-12-23 NOTE — Progress Notes (Signed)
 New Patient Evaluation and Consultation  Referring Provider: Glennon Almarie POUR, MD PCP: Tisovec, Richard W, MD Date of Service: 12/23/2023  SUBJECTIVE Chief Complaint: New Patient (Initial Visit) Ashley Cooke is a 83 y.o. female is here for prolapse.)  History of Present Illness: Ashley Cooke is a 83 y.o. White or Caucasian female seen in consultation at the request of Dr Glennon for evaluation of prolapse.    She has recently completed lumpectomy and radiation for breast cancer.   Urinary Symptoms: Leaks urine with exercise Leaks 2 time(s) per week.  Pad use: none Patient is bothered by UI symptoms.  Day time voids 5-8.  Nocturia: 3 times per night to void. Voiding dysfunction:  does not empty bladder well.  Patient does not use a catheter to empty bladder.  When urinating, patient feels dribbling after finishing Drinks: coffee, milk, glass of decaf tea, 32oz water per day. She does drink wine with dinner  UTIs: 0 UTI's in the last year.   Denies history of blood in urine and kidney or bladder stones   Pelvic Organ Prolapse Symptoms:                  Patient Denies a feeling of a bulge the vaginal area.   Bowel Symptom: Bowel movements: 2-3 time(s) per day Stool consistency: loose Straining: no.  Splinting: no.  Incomplete evacuation: yes.  Patient Admits to accidental bowel leakage / fecal incontinence  Occurs: during exercise  Consistency with leakage: liquid Bowel regimen: stool softener- nightly   Sexual Function Sexually active: no.    Pelvic Pain Denies pelvic pain  Has noticed some abdominal bloating as well as a new nodule above her belly button. She states her clothes are not fitting her well anymore.   Past Medical History:  Past Medical History:  Diagnosis Date   Arthritis    primarily in my knee & thumbs (10/15/2014)   Breast cancer (HCC)    Hypercholesterolemia    Hypertension    Melanoma of lower leg (HCC) 05/2003   left   Osteopenia     Seasonal allergies    Squamous carcinoma    all on the left side:  under eye, nostril, bicept     Past Surgical History:   Past Surgical History:  Procedure Laterality Date   BREAST BIOPSY Right 04/29/2023   US  RT BREAST BX W LOC DEV 1ST LESION IMG BX SPEC US  GUIDE 04/29/2023 [facility]   BREAST BIOPSY  05/12/2023   MM RT RADIOACTIVE SEED LOC MAMMO GUIDE 05/12/2023 [facility]   BREAST LUMPECTOMY WITH RADIOACTIVE SEED LOCALIZATION Right 05/13/2023   Procedure: BREAST LUMPECTOMY WITH RADIOACTIVE SEED LOCALIZATION;  Surgeon: Vanderbilt Ned, MD;  Location: [facility];  Service: General;  Laterality: Right;  RIGHT BREAST SEED LUMPECTOMY   cataract Bilateral 05/21/2017   COLONOSCOPY     DILATION AND CURETTAGE OF UTERUS  1968   JOINT REPLACEMENT Bilateral    knee-     KNEE ARTHROSCOPY Left 05/06/2012   torn miniscus   MELANOMA EXCISION Left 05/2003   leg   MOHS SURGERY Left 2000's   squamous; under eye and nostril   SQUAMOUS CELL CARCINOMA EXCISION Left 2008   upper bicept   TONSILLECTOMY AND ADENOIDECTOMY  1949   TOTAL KNEE ARTHROPLASTY Left 10/14/2014   TOTAL KNEE ARTHROPLASTY Left 10/14/2014   Procedure: TOTAL KNEE ARTHROPLASTY;  Surgeon: Toribio Silos, MD;  Location: [facility] OR;  Service: Orthopedics;  Laterality: Left;   TOTAL KNEE  ARTHROPLASTY Right 02/10/2015   Procedure: RIGHT TOTAL KNEE ARTHROPLASTY;  Surgeon: Toribio Silos, MD;  Location: [facility] OR;  Service: Orthopedics;  Laterality: Right;   TOTAL SHOULDER ARTHROPLASTY Right 08/13/2016   Procedure: TOTAL SHOULDER ARTHROPLASTY;  Surgeon: Beverley Evalene BIRCH, MD;  Location: [facility] OR;  Service: Orthopedics;  Laterality: Right;   TOTAL SHOULDER ARTHROPLASTY Left 03/18/2017   Procedure: TOTAL SHOULDER ARTHROPLASTY;  Surgeon: Beverley Evalene BIRCH, MD;  Location: [facility] OR;  Service: Orthopedics;  Laterality: Left;     Past OB/GYN History: OB History  Gravida Para Term Preterm AB Living  4 4 4  0 0 4  SAB  IAB Ectopic Multiple Live Births  0 0 0 0 4    # Outcome Date GA Lbr Len/2nd Weight Sex Type Anes PTL Lv  4 Term 22    M Vag-Spont   LIV  3 Term 10    F Vag-Spont   LIV  2 Term 23    M Vag-Spont   LIV  1 Term 77    F Vag-Spont   LIV    Menopausal: Denies vaginal bleeding since menopause    Component Value Date/Time   DIAGPAP  04/30/2023 1612    - Negative for intraepithelial lesion or malignancy (NILM)   DIAGPAP  10/30/2016 0000    NEGATIVE FOR INTRAEPITHELIAL LESIONS OR MALIGNANCY.   ADEQPAP  04/30/2023 1612    Satisfactory for evaluation. The presence or absence of an   ADEQPAP  04/30/2023 1612    endocervical/transformation zone component cannot be determined because   ADEQPAP of atrophy. 04/30/2023 1612    Medications: Patient has a current medication list which includes the following prescription(s): acetaminophen , alendronate, aspirin  ec, atorvastatin , calcium  carbonate-vitamin d , cholecalciferol , docusate calcium , famotidine , carboxymethylcellulose sodium, ketotifen, loratadine , losartan , meloxicam, multivitamin with minerals, ascorbic acid , and probiotic product.   Allergies: Patient is allergic to benazepril hcl and ciprofloxacin.   Social History:  Social History   Tobacco Use   Smoking status: Former    Current packs/day: 0.00    Average packs/day: 0.5 packs/day for 22.0 years (11.0 ttl pk-yrs)    Types: Cigarettes    Start date: 06/21/1957    Quit date: 06/22/1979    Years since quitting: 44.5   Smokeless tobacco: Never  Vaping Use   Vaping status: Never Used  Substance Use Topics   Alcohol  use: Yes    Alcohol /week: 7.0 standard drinks of alcohol     Types: 7 Glasses of wine per week   Drug use: No    Relationship status: married Patient lives with her husband.   Patient is not employed. Regular exercise: Yes: tennis, swimming, gym, tai chi History of abuse: No  Family History:   Family History  Problem Relation Age of Onset   Hypertension  Mother    Hypertension Sister 10   Peripheral Artery Disease Sister    Hypertension Maternal Grandfather    Colon cancer Neg Hx    Breast cancer Neg Hx    BRCA 1/2 Neg Hx      Review of Systems: Review of Systems  Constitutional:  Negative for fever, malaise/fatigue and weight loss.  Respiratory:  Negative for cough, shortness of breath and wheezing.   Cardiovascular:  Negative for chest pain, palpitations and leg swelling.  Gastrointestinal:  Positive for abdominal pain. Negative for blood in stool.  Genitourinary:  Negative for dysuria.  Musculoskeletal:  Negative for myalgias.  Skin:  Negative for rash.  Neurological:  Negative for dizziness and headaches.  Endo/Heme/Allergies:  Does not bruise/bleed easily.  Psychiatric/Behavioral:  Negative for depression. The patient is not nervous/anxious.      OBJECTIVE Physical Exam: Vitals:   12/23/23 1105  BP: 133/71  Pulse: 75  Weight: 163 lb (73.9 kg)  Height: 5' 3 (1.6 m)    Physical Exam Vitals reviewed. Exam conducted with a chaperone present.  Constitutional:      General: She is not in acute distress. Pulmonary:     Effort: Pulmonary effort is normal.  Abdominal:     General: There is distension.     Palpations: Abdomen is soft. There is mass.     Tenderness: There is no abdominal tenderness. There is no rebound.     Comments: 4cm firm nodule above umbilicus, fixed No fluid wave  Musculoskeletal:        General: No swelling. Normal range of motion.  Skin:    General: Skin is warm and dry.     Findings: No rash.  Neurological:     Mental Status: She is alert and oriented to person, place, and time.  Psychiatric:        Mood and Affect: Mood normal.        Behavior: Behavior normal.      GU / Detailed Urogynecologic Evaluation:  Pelvic Exam: Normal external female genitalia; Bartholin's and Skene's glands normal in appearance; urethral meatus normal in appearance, no urethral masses or discharge.   CST:  negative  Speculum exam reveals normal vaginal mucosa with atrophy. Cervix normal appearance. Uterus normal single, nontender. Adnexa no mass, fullness, tenderness.     Pelvic floor strength I/V, puborectalis I/V external anal sphincter I/V  Pelvic floor musculature: Right levator non-tender, Right obturator non-tender, Left levator non-tender, Left obturator non-tender  POP-Q:   POP-Q  0                                            Aa   0                                           Ba  -8                                              C   3                                            Gh  4.5                                            Pb  8                                            tvl   -2  Ap  -2                                            Bp  -7.5                                              D      Rectal Exam:  Diminished sphincter tone, no distal rectocele, enterocoele not present, no rectal masses, no sign of dyssynergia when asking the patient to bear down.  Post-Void Residual (PVR) by Bladder Scan: In order to evaluate bladder emptying, we discussed obtaining a postvoid residual and patient agreed to this procedure.  Procedure: The ultrasound unit was placed on the patient's abdomen in the suprapubic region after the patient had voided.    Post Void Residual - 12/23/23 1122       Post Void Residual   Post Void Residual 9 mL           Laboratory Results: Lab Results  Component Value Date   COLORU yellow 12/23/2023   CLARITYU clear 12/23/2023   GLUCOSEUR negative 12/23/2023   BILIRUBINUR negative 12/23/2023   KETONESU neg 12/29/2015   SPECGRAV 1.015 12/23/2023   RBCUR negative 12/23/2023   PHUR 6.0 12/23/2023   PROTEINUR neg 12/29/2015   UROBILINOGEN 0.2 12/23/2023   LEUKOCYTESUR Negative 12/23/2023    Lab Results  Component Value Date   CREATININE 1.05 (H) 04/30/2023   CREATININE 1.10 (H) 04/30/2023    CREATININE 1.03 (H) 03/07/2017    No results found for: HGBA1C  Lab Results  Component Value Date   HGB 14.5 03/07/2017     ASSESSMENT AND PLAN Ms. Starace is a 83 y.o. with:  1. Nocturia   2. SUI (stress urinary incontinence, female)   3. Urinary frequency   4. Incontinence of feces, unspecified fecal incontinence type   5. Abdominal wall mass     Nocturia Assessment & Plan: - we discussed avoiding drinking a few hours before bed time and avoiding irritative beverages in the evening.  - Pt states she would rather drink her wine in the evening and wake up to urinate, so is not bothered by her nocturia   SUI (stress urinary incontinence, female) Assessment & Plan: - She is not bothered by her occasional SUI. We discussed there are treatment options if she is interested. She wants to work on kegel exercises on her own and does not want referral to pelvic PT at this time.    Urinary frequency -     POCT URINALYSIS DIP (CLINITEK)  Incontinence of feces, unspecified fecal incontinence type Assessment & Plan: - Treatment options include anti-diarrhea medication (loperamide/ Imodium OTC or prescription lomotil), fiber supplements, physical therapy, and possible sacral neuromodulation or surgery.   - Recommended starting with daily fiber supplementation for stool bulking    Abdominal wall mass Assessment & Plan: - Will order CT due to 4cm abdominal wall nodule and abdominal distention.  - Messaged NP at oncology [facility] regarding follow up  Orders: -     CT ABDOMEN PELVIS W WO CONTRAST; Future  Return as needed   Rosaline LOISE Caper, MD

## 2023-12-23 NOTE — Assessment & Plan Note (Signed)
-   we discussed avoiding drinking a few hours before bed time and avoiding irritative beverages in the evening.  - Pt states she would rather drink her wine in the evening and wake up to urinate, so is not bothered by her nocturia

## 2023-12-23 NOTE — Assessment & Plan Note (Addendum)
-   She is not bothered by her occasional SUI. We discussed there are treatment options if she is interested. She wants to work on kegel exercises on her own and does not want referral to pelvic PT at this time.

## 2023-12-23 NOTE — Assessment & Plan Note (Signed)
-   Treatment options include anti-diarrhea medication (loperamide/ Imodium OTC or prescription lomotil), fiber supplements, physical therapy, and possible sacral neuromodulation or surgery.   - Recommended starting with daily fiber supplementation for stool bulking

## 2023-12-26 ENCOUNTER — Ambulatory Visit (HOSPITAL_BASED_OUTPATIENT_CLINIC_OR_DEPARTMENT_OTHER): Admission: RE | Admit: 2023-12-26 | Discharge: 2023-12-26 | Attending: Obstetrics and Gynecology

## 2023-12-26 DIAGNOSIS — R222 Localized swelling, mass and lump, trunk: Secondary | ICD-10-CM

## 2023-12-26 LAB — POCT I-STAT CREATININE: Creatinine, Ser: 1.1 mg/dL — ABNORMAL HIGH (ref 0.44–1.00)

## 2023-12-26 MED ORDER — IOHEXOL 300 MG/ML  SOLN
100.0000 mL | Freq: Once | INTRAMUSCULAR | Status: AC | PRN
  Administered 2023-12-26: 100 mL via INTRAVENOUS

## 2023-12-29 ENCOUNTER — Telehealth: Payer: Self-pay | Admitting: Obstetrics and Gynecology

## 2023-12-29 ENCOUNTER — Encounter: Payer: Self-pay | Admitting: Obstetrics and Gynecology

## 2023-12-29 DIAGNOSIS — C786 Secondary malignant neoplasm of retroperitoneum and peritoneum: Secondary | ICD-10-CM

## 2023-12-29 NOTE — Telephone Encounter (Signed)
 Attempted to call patient regarding her CT scan results. Left VM that I will call back since office is closed.   Sent message to Oncologist regarding follow up. Spoke with Dr Viktoria who reviewed scan and also recommended referral in case of GYN origin. Requested ordering tumor markers: CA-125, CEA, CA 19-9.

## 2023-12-30 ENCOUNTER — Other Ambulatory Visit: Payer: Self-pay

## 2023-12-30 ENCOUNTER — Inpatient Hospital Stay

## 2023-12-30 ENCOUNTER — Telehealth: Payer: Self-pay

## 2023-12-30 ENCOUNTER — Telehealth: Payer: Self-pay | Admitting: Hematology and Oncology

## 2023-12-30 ENCOUNTER — Inpatient Hospital Stay: Attending: Adult Health | Admitting: Hematology and Oncology

## 2023-12-30 VITALS — BP 147/73 | HR 83 | Temp 97.1°F | Resp 16 | Wt 163.5 lb

## 2023-12-30 DIAGNOSIS — Z87891 Personal history of nicotine dependence: Secondary | ICD-10-CM | POA: Diagnosis not present

## 2023-12-30 DIAGNOSIS — R978 Other abnormal tumor markers: Secondary | ICD-10-CM | POA: Diagnosis not present

## 2023-12-30 DIAGNOSIS — C50311 Malignant neoplasm of lower-inner quadrant of right female breast: Secondary | ICD-10-CM | POA: Diagnosis present

## 2023-12-30 DIAGNOSIS — R188 Other ascites: Secondary | ICD-10-CM

## 2023-12-30 DIAGNOSIS — K769 Liver disease, unspecified: Secondary | ICD-10-CM | POA: Insufficient documentation

## 2023-12-30 DIAGNOSIS — C786 Secondary malignant neoplasm of retroperitoneum and peritoneum: Secondary | ICD-10-CM | POA: Insufficient documentation

## 2023-12-30 DIAGNOSIS — R971 Elevated cancer antigen 125 [CA 125]: Secondary | ICD-10-CM | POA: Diagnosis not present

## 2023-12-30 DIAGNOSIS — Z1721 Progesterone receptor positive status: Secondary | ICD-10-CM | POA: Diagnosis not present

## 2023-12-30 DIAGNOSIS — Z1732 Human epidermal growth factor receptor 2 negative status: Secondary | ICD-10-CM | POA: Diagnosis not present

## 2023-12-30 DIAGNOSIS — Z923 Personal history of irradiation: Secondary | ICD-10-CM | POA: Diagnosis not present

## 2023-12-30 DIAGNOSIS — Z170 Estrogen receptor positive status [ER+]: Secondary | ICD-10-CM | POA: Insufficient documentation

## 2023-12-30 LAB — CBC WITH DIFFERENTIAL/PLATELET
Abs Immature Granulocytes: 0.01 K/uL (ref 0.00–0.07)
Basophils Absolute: 0 K/uL (ref 0.0–0.1)
Basophils Relative: 0 %
Eosinophils Absolute: 0.3 K/uL (ref 0.0–0.5)
Eosinophils Relative: 3 %
HCT: 42.4 % (ref 36.0–46.0)
Hemoglobin: 14.5 g/dL (ref 12.0–15.0)
Immature Granulocytes: 0 %
Lymphocytes Relative: 12 %
Lymphs Abs: 1 K/uL (ref 0.7–4.0)
MCH: 30.3 pg (ref 26.0–34.0)
MCHC: 34.2 g/dL (ref 30.0–36.0)
MCV: 88.5 fL (ref 80.0–100.0)
Monocytes Absolute: 0.7 K/uL (ref 0.1–1.0)
Monocytes Relative: 9 %
Neutro Abs: 6.4 K/uL (ref 1.7–7.7)
Neutrophils Relative %: 76 %
Platelets: 403 K/uL — ABNORMAL HIGH (ref 150–400)
RBC: 4.79 MIL/uL (ref 3.87–5.11)
RDW: 14.2 % (ref 11.5–15.5)
WBC: 8.4 K/uL (ref 4.0–10.5)
nRBC: 0 % (ref 0.0–0.2)

## 2023-12-30 LAB — CMP (CANCER CENTER ONLY)
ALT: 39 U/L (ref 0–44)
AST: 50 U/L — ABNORMAL HIGH (ref 15–41)
Albumin: 4.4 g/dL (ref 3.5–5.0)
Alkaline Phosphatase: 175 U/L — ABNORMAL HIGH (ref 38–126)
Anion gap: 10 (ref 5–15)
BUN: 19 mg/dL (ref 8–23)
CO2: 26 mmol/L (ref 22–32)
Calcium: 9.9 mg/dL (ref 8.9–10.3)
Chloride: 97 mmol/L — ABNORMAL LOW (ref 98–111)
Creatinine: 0.97 mg/dL (ref 0.44–1.00)
GFR, Estimated: 57 mL/min — ABNORMAL LOW (ref >60)
Glucose, Bld: 96 mg/dL (ref 70–99)
Potassium: 4.7 mmol/L (ref 3.5–5.1)
Sodium: 133 mmol/L — ABNORMAL LOW (ref 135–145)
Total Bilirubin: 0.6 mg/dL (ref 0.0–1.2)
Total Protein: 7.7 g/dL (ref 6.5–8.1)

## 2023-12-30 LAB — CEA (ACCESS): CEA (CHCC): 1.35 ng/mL (ref 0.00–5.00)

## 2023-12-30 NOTE — Telephone Encounter (Signed)
 I SPOKE WITH PATIENT AND SHE IS AWARE OF APPOINTMENT TIME CHANGE FROM 11:30 AM TO 12:15 PM ON 01/19/2024.

## 2023-12-30 NOTE — Progress Notes (Signed)
 BRIEF ONCOLOGIC HISTORY:  Oncology History  Malignant neoplasm of lower-inner quadrant of female breast (HCC)  04/28/2023 Mammogram   Screening and diagnostic mammogram showed slightly suspicious 7 mm mass over the 2:30 position of the right breast.   04/29/2023 Pathology Results   Right breast needle core biopsy at 230 showed invasive ductal carcinoma, overall grade 2, ER 100% positive strong staining intensity PR 100% positive strong staining intensity, HER2 1+ Ki-67 of 15%   05/07/2023 Cancer Staging   Staging form: Breast, AJCC 8th Edition - Clinical stage from 05/07/2023: Stage IA (cT1b, cN0, cM0, G2, ER+, PR+, HER2-) - Signed by [name], MD on 05/07/2023 Stage prefix: Initial diagnosis Histologic grading system: 3 grade system   05/13/2023 Surgery   BREAST, RIGHT, LUMPECTOMY:       Invasive ductal carcinoma, 1.0 cm, grade 2  Ductal carcinoma in situ:  Not identified  Margins, invasive:  Negative            Closest, invasive:  0.3 cm from inferior margin  Lymphovascular invasion:  Not identified  Prognostic markers:  ER positive, PR positive, Her2 negative, Ki-67 15%  Other:  Fibrocystic changes.  See oncology table    06/10/2023 - 07/08/2023 Radiation Therapy   Plan Name: Breast_R Site: Breast, Right Technique: 3D Mode: Photon Dose Per Fraction: 5.7 Gy Prescribed Dose (Delivered / Prescribed): 28.5 Gy / 28.5 Gy Prescribed Fxs (Delivered / Prescribed): 5 / 5     INTERVAL HISTORY:   Discussed the use of AI scribe software for clinical note transcription with the patient, who gave verbal consent to proceed.  History of Present Illness  Ashley Cooke is an 83 year old female with a history of breast cancer who presents with abdominal distention and an umbilical nodule. She is accompanied by her husband.  She experiences significant discomfort due to abdominal distention. She wants the fluid to be drained to alleviate the discomfort.  Approximately six weeks ago, she  noticed a small, non-painful mass near her umbilicus. Around the same time, she began experiencing abdominal swelling and fluid accumulation, which she estimates has been ongoing for about three months.  Imaging studies have identified a small area of concern in her right liver along with ascites and peritoneal carcinomatosis. She denies any new medications and reports no significant changes in her medication regimen. No shortness of breath is noted, but she reports decreased stamina, particularly noticeable during activities such as playing tennis, which she continues to engage in despite feeling less vigorous than before her radiation treatment.  Her family history does not include any known cases of ovarian or other female cancers. She plays tennis regularly and plans to [place] the [place].  Rest of the pertinent 10 point ROS reviewed and neg.   PAST MEDICAL/SURGICAL HISTORY:  Past Medical History:  Diagnosis Date   Arthritis    primarily in my knee & thumbs (10/15/2014)   Breast cancer (HCC)    Hypercholesterolemia    Hypertension    Melanoma of lower leg (HCC) 05/2003   left   Osteopenia    Seasonal allergies    Squamous carcinoma    all on the left side:  under eye, nostril, bicept   Past Surgical History:  Procedure Laterality Date   BREAST BIOPSY Right 04/29/2023   US  RT BREAST BX W LOC DEV 1ST LESION IMG BX SPEC US  GUIDE 04/29/2023 [facility]MO[facility]   BREAST BIOPSY  05/12/2023   MM RT  RADIOACTIVE SEED LOC MAMMO GUIDE 05/12/2023 [facility]MO[facility]   BREAST LUMPECTOMY WITH RADIOACTIVE SEED LOCALIZATION Right 05/13/2023   Procedure: BREAST LUMPECTOMY WITH RADIOACTIVE SEED LOCALIZATION;  Surgeon: Vanderbilt Ned, MD;  Location: [facility];  Service: General;  Laterality: Right;  RIGHT BREAST SEED LUMPECTOMY   cataract Bilateral 05/21/2017   COLONOSCOPY     DILATION AND CURETTAGE OF UTERUS  1968   JOINT  REPLACEMENT Bilateral    knee-     KNEE ARTHROSCOPY Left 05/06/2012   torn miniscus   MELANOMA EXCISION Left 05/2003   leg   MOHS SURGERY Left 2000's   squamous; under eye and nostril   SQUAMOUS CELL CARCINOMA EXCISION Left 2008   upper bicept   TONSILLECTOMY AND ADENOIDECTOMY  1949   TOTAL KNEE ARTHROPLASTY Left 10/14/2014   TOTAL KNEE ARTHROPLASTY Left 10/14/2014   Procedure: TOTAL KNEE ARTHROPLASTY;  Surgeon: Toribio Silos, MD;  Location: [facility] OR;  Service: Orthopedics;  Laterality: Left;   TOTAL KNEE ARTHROPLASTY Right 02/10/2015   Procedure: RIGHT TOTAL KNEE ARTHROPLASTY;  Surgeon: Toribio Silos, MD;  Location: [facility] OR;  Service: Orthopedics;  Laterality: Right;   TOTAL SHOULDER ARTHROPLASTY Right 08/13/2016   Procedure: TOTAL SHOULDER ARTHROPLASTY;  Surgeon: Beverley Evalene BIRCH, MD;  Location: [facility] OR;  Service: Orthopedics;  Laterality: Right;   TOTAL SHOULDER ARTHROPLASTY Left 03/18/2017   Procedure: TOTAL SHOULDER ARTHROPLASTY;  Surgeon: Beverley Evalene BIRCH, MD;  Location: [facility] OR;  Service: Orthopedics;  Laterality: Left;     ALLERGIES:  Allergies  Allergen Reactions   Benazepril Hcl Cough   Ciprofloxacin     Other Reaction(s): tendonitis in achilles area     CURRENT MEDICATIONS:  Outpatient Encounter Medications as of 12/30/2023  Medication Sig   acetaminophen  (TYLENOL ) 500 MG tablet Take 1,000 mg by mouth every 6 (six) hours as needed.   alendronate (FOSAMAX) 70 MG tablet Take by mouth.   aspirin  EC 81 MG tablet Take 81 mg by mouth daily.   atorvastatin  (LIPITOR) 10 MG tablet Take 10 mg by mouth every evening.   Calcium  Carbonate-Vitamin D  (CALCIUM  600+D PO) Take 1 tablet by mouth daily.   cholecalciferol  (VITAMIN D ) 1000 UNITS tablet Take 1,000 Units by mouth daily.   Docusate Calcium  (STOOL SOFTENER PO) Take by mouth.   famotidine  (PEPCID ) 20 MG tablet Take 20 mg by mouth daily.   Hypromellose (ARTIFICIAL TEARS OP) Apply 1 drop to eye 4 (four) times daily as needed  (dry eyes).   ketotifen (ZADITOR) 0.025 % ophthalmic solution Place 1 drop into both eyes 2 (two) times daily as needed (allergies).   loratadine  (CLARITIN ) 10 MG tablet Take 10 mg by mouth daily as needed (seasonal allergies).    losartan  (COZAAR ) 50 MG tablet Take 50 mg by mouth daily.   meloxicam (MOBIC) 7.5 MG tablet Take 15 mg by mouth daily as needed.   Multiple Vitamin (MULTIVITAMIN WITH MINERALS) TABS tablet Take 1 tablet by mouth daily.   Probiotic Product (PROBIOTIC PO) Take by mouth every other day. (Patient not taking: Reported on 12/23/2023)   vitamin C  (ASCORBIC ACID ) 500 MG tablet Take 500 mg by mouth daily.   No facility-administered encounter medications on file as of 12/30/2023.     ONCOLOGIC FAMILY HISTORY:  Family History  Problem Relation Age of Onset   Hypertension Mother    Hypertension Sister 18   Peripheral Artery Disease Sister    Hypertension Maternal Grandfather    Colon cancer Neg Hx    Breast cancer Neg  Hx    BRCA 1/2 Neg Hx      SOCIAL HISTORY:  Social History   Socioeconomic History   Marital status: Married    Spouse name: F.Toribio Pais   Number of children: Not on file   Years of education: Not on file   Highest education level: Not on file  Occupational History   Not on file  Tobacco Use   Smoking status: Former    Current packs/day: 0.00    Average packs/day: 0.5 packs/day for 22.0 years (11.0 ttl pk-yrs)    Types: Cigarettes    Start date: 06/21/1957    Quit date: 06/22/1979    Years since quitting: 44.5   Smokeless tobacco: Never  Vaping Use   Vaping status: Never Used  Substance and Sexual Activity   Alcohol  use: Yes    Alcohol /week: 7.0 standard drinks of alcohol     Types: 7 Glasses of wine per week   Drug use: No   Sexual activity: Not Currently    Partners: Male    Birth control/protection: Post-menopausal    Comment: less than 5, older than 16  Other Topics Concern   Not on file  Social History Narrative   Not on file    Social Drivers of Health   Financial Resource Strain: Not on file  Food Insecurity: No Food Insecurity (05/28/2023)   Hunger Vital Sign    Worried About Running Out of Food in the Last Year: Never true    Ran Out of Food in the Last Year: Never true  Transportation Needs: No Transportation Needs (05/28/2023)   PRAPARE - Administrator, Civil Service (Medical): No    Lack of Transportation (Non-Medical): No  Physical Activity: Not on file  Stress: Not on file  Social Connections: Not on file  Intimate Partner Violence: Not At Risk (05/28/2023)   Humiliation, Afraid, Rape, and Kick questionnaire    Fear of Current or Ex-Partner: No    Emotionally Abused: No    Physically Abused: No    Sexually Abused: No     OBSERVATIONS/OBJECTIVE:  BP (!) 147/73 (BP Location: Right Arm, Patient Position: Sitting)   Pulse 83   Temp (!) 97.1 F (36.2 C) (Temporal)   Resp 16   Wt 163 lb 8 oz (74.2 kg)   LMP 01/21/1977 (Approximate)   SpO2 98%   BMI 28.96 kg/m   GENERAL: Patient is a well appearing female in no acute distress HEENT:  Sclerae anicteric.  Oropharynx clear and moist. No ulcerations or evidence of oropharyngeal candidiasis. Neck is supple.  NODES:  No cervical, supraclavicular, or axillary lymphadenopathy palpated.  Periumbilical nodule palpated, abdominal distension noted RRR No LE edema.   LABORATORY DATA:  None for this visit.  DIAGNOSTIC IMAGING:  None for this visit.     ASSESSMENT AND PLAN:  Ms.. Mcghie is a pleasant 83 y.o. female with Stage IA right breast invasive ductal carcinoma, ER+/PR+/HER2-, diagnosed in 04/2023, treated with lumpectomy, adjuvant radiation therapy.  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 Differential includes ovarian or other gynecological cancers. Breast cancer unlikely due to early staging and recent neg Guardant. -  Ordered PET scan to evaluate for additional disease. - Ordered ultrasound-guided paracentesis for fluid drainage and cytology. - Ordered blood work for tumor markers. - Refer to gynecological oncologist Dr. Viktoria if ovarian cancer suspected. - Coordinate with medical oncologist Dr. Lonn for potential chemotherapy once diagnosis is confirmed.  Total encounter time:40 minutes*in face-to-face visit time, chart review, lab review, care coordination, order entry, and documentation of the encounter time.  Amber Stalls MD    *Total Encounter Time a/s defined by the Centers for Medicare and Medicaid Services includes, in addition to the face-to-face time of a patient visit (documented in the note above) non-face-to-face time: obtaining and reviewing outside history, ordering and reviewing medications, tests or procedures, care coordination (communications with other health care professionals or caregivers) and documentation in the medical record.

## 2023-12-30 NOTE — Telephone Encounter (Signed)
 Called pt to let her know we scheduled her for a paracentesis with cytology and requested breast prognostics if cytology is breast malignant.   Pt was given instructions to arrive at [facility] entrance A 12/31/23 at 1230. She is agreeable. MD f/u is scheduled for 12/29, but she knows to call us  in the interim if she should notice her abd swelling again, difficulty breathing, eating, drinking, having Bms.

## 2023-12-31 ENCOUNTER — Other Ambulatory Visit: Payer: Self-pay | Admitting: Hematology and Oncology

## 2023-12-31 ENCOUNTER — Ambulatory Visit (HOSPITAL_COMMUNITY): Admission: RE | Admit: 2023-12-31 | Discharge: 2023-12-31 | Attending: Hematology and Oncology

## 2023-12-31 DIAGNOSIS — C786 Secondary malignant neoplasm of retroperitoneum and peritoneum: Secondary | ICD-10-CM

## 2023-12-31 DIAGNOSIS — R188 Other ascites: Secondary | ICD-10-CM

## 2023-12-31 DIAGNOSIS — C50311 Malignant neoplasm of lower-inner quadrant of right female breast: Secondary | ICD-10-CM

## 2023-12-31 DIAGNOSIS — Z170 Estrogen receptor positive status [ER+]: Secondary | ICD-10-CM | POA: Diagnosis not present

## 2023-12-31 LAB — CA 125: Cancer Antigen (CA) 125: 1704 U/mL — ABNORMAL HIGH (ref 0.0–38.1)

## 2023-12-31 LAB — CANCER ANTIGEN 19-9: CA 19-9: 72 U/mL — ABNORMAL HIGH (ref 0–35)

## 2023-12-31 MED ORDER — LIDOCAINE-EPINEPHRINE 1 %-1:100000 IJ SOLN
INTRAMUSCULAR | Status: AC
  Filled 2023-12-31: qty 1

## 2023-12-31 NOTE — Progress Notes (Signed)
 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 She presents today with request for diagnostic and therapeutic paracentesis.   Imaging of all 4 quadrants of the abdomen reveal no ascites. Patient asked to lie on her side, and limited US  repeated. Small pocket of ascites in RLQ with no pocket adequate for paracentesis.  After discussion of the risks versus benefits of the procedure, decision was made to defer paracentesis at this time.   Patient may return for repeat US  and paracentesis if it is felt fluid has increased enough to allow for safe puncture.   Please see imaging section of Epic for full dictation.  Uzair Godley NP 12/31/2023 1:21 PM

## 2024-01-02 ENCOUNTER — Encounter (HOSPITAL_COMMUNITY): Payer: Self-pay

## 2024-01-02 ENCOUNTER — Other Ambulatory Visit: Payer: Self-pay | Admitting: Hematology and Oncology

## 2024-01-02 DIAGNOSIS — C786 Secondary malignant neoplasm of retroperitoneum and peritoneum: Secondary | ICD-10-CM

## 2024-01-02 NOTE — Progress Notes (Signed)
 Karalee Wilkie POUR, MD  [employer] No       Previous Messages    ----- Message ----- From: Braylen Denunzio Sent: 01/02/2024   3:32 PM EST To: Wilkie POUR Karalee, MD Subject: RE: IR   ABD MASS BIOPSY                      She is on aspirin  - does she need to hold off on it prior ? ----- Message ----- From: Karalee Wilkie POUR, MD Sent: 01/02/2024   9:33 AM EST To: Eleanor Mighty Subject: RE: IR   ABD MASS BIOPSY                      Approved for US  guided core biopsy of superficial umbilical nodule - hx of breast cancer now with peritoneal carcinomatosis.    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

## 2024-01-02 NOTE — Progress Notes (Signed)
 Ashley Cooke POUR, MD  [name] Approved for US  guided core biopsy of superficial umbilical nodule - hx of breast cancer now with peritoneal carcinomatosis.    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

## 2024-01-02 NOTE — Progress Notes (Signed)
 IR biopsy orders placed.   Ashley Cooke

## 2024-01-05 ENCOUNTER — Inpatient Hospital Stay (HOSPITAL_BASED_OUTPATIENT_CLINIC_OR_DEPARTMENT_OTHER): Admitting: Psychiatry

## 2024-01-05 ENCOUNTER — Encounter: Payer: Self-pay | Admitting: Psychiatry

## 2024-01-05 VITALS — BP 130/59 | HR 71 | Temp 98.3°F | Resp 19 | Ht 63.0 in | Wt 164.6 lb

## 2024-01-05 DIAGNOSIS — Z85820 Personal history of malignant melanoma of skin: Secondary | ICD-10-CM

## 2024-01-05 DIAGNOSIS — R935 Abnormal findings on diagnostic imaging of other abdominal regions, including retroperitoneum: Secondary | ICD-10-CM | POA: Diagnosis not present

## 2024-01-05 DIAGNOSIS — C50311 Malignant neoplasm of lower-inner quadrant of right female breast: Secondary | ICD-10-CM

## 2024-01-05 DIAGNOSIS — R918 Other nonspecific abnormal finding of lung field: Secondary | ICD-10-CM | POA: Diagnosis not present

## 2024-01-05 DIAGNOSIS — C787 Secondary malignant neoplasm of liver and intrahepatic bile duct: Secondary | ICD-10-CM

## 2024-01-05 DIAGNOSIS — D484 Neoplasm of uncertain behavior of peritoneum: Secondary | ICD-10-CM

## 2024-01-05 DIAGNOSIS — R978 Other abnormal tumor markers: Secondary | ICD-10-CM

## 2024-01-05 DIAGNOSIS — R971 Elevated cancer antigen 125 [CA 125]: Secondary | ICD-10-CM | POA: Diagnosis not present

## 2024-01-05 DIAGNOSIS — C792 Secondary malignant neoplasm of skin: Secondary | ICD-10-CM

## 2024-01-05 DIAGNOSIS — R933 Abnormal findings on diagnostic imaging of other parts of digestive tract: Secondary | ICD-10-CM | POA: Diagnosis not present

## 2024-01-05 DIAGNOSIS — C786 Secondary malignant neoplasm of retroperitoneum and peritoneum: Secondary | ICD-10-CM

## 2024-01-05 NOTE — Patient Instructions (Addendum)
 We will follow the results of your upcoming biopsy.   We will likely recommend proceeding with chemotherapy.  Dr. Eldonna will see you back in the office after several treatments if biopsy confirms this is gyn cancer.  We will also place a referral for you to meet with a genetics counselor.

## 2024-01-05 NOTE — Progress Notes (Signed)
 GYNECOLOGIC ONCOLOGY NEW PATIENT CONSULTATION  Date of Service: 01/05/2024 Referring Provider: Rosaline Caper, MD   ASSESSMENT AND PLAN: Ashley Cooke is a 83 y.o. woman with CT imaging concerning for peritoneal carcinomatosis with omental cake, Sister Ronal Pac nodule at the umbilicus, possible hepatic metastasis and an indeterminant left lower lobe pulmonary nodule.  Reviewed imaging findings with the patient.  Reviewed concern for possible ovarian cancer versus fallopian tube cancer versus primary peritoneal cancer.  No obvious pelvic mass on imaging although there is diffuse peritoneal soft tissue thickening in the pelvis and free fluid which may make discerning discrete mass more challenging.  Reviewed with patient that all 3 of these etiologies are managed the same.  Alternative possibility would be a primary malignancy from another site that metastasized to the peritoneal cavity.  Patient does have elevated CA125 to 1704 and mildly elevated CA 19-9.  This may also support a possible GYN origin.  Based on her imaging findings thus far, suspect the patient will benefit from neoadjuvant chemotherapy prior to the surgical debulking.  However, reviewed with patient that tissue diagnosis needs to be obtained first prior to being able to initiate chemotherapy.  Patient had been scheduled for a biopsy on 01/27/2024 per patient's preference for scheduled family holiday time.  Following this discussion, patient amenable to moving up her biopsy which was moved up to 01/21/24.  She also has a PET scan scheduled for 01/08/2024.  Patient can keep this.  She has not yet had chest imaging but we will see what the PET/CT shows.  Patient has followed with Dr. Loretha for her breast cancer.  Will defer to the medical oncology group, once pathology is confirmed, who will manage her chemotherapy.  Finalize plan once tissue diagnosis made.  Reviewed in brief that if this is gynecologic, we will start with  chemotherapy and plan for interval imaging after 3 cycles with tentative debulking surgery after likely cycle 4.    Referral to genetics given personal history of breast cancer, melanoma and now possible ovarian/primary peritoneal cancer.  A copy of this note was sent to the patient's referring provider.  Hoy Masters, MD Gynecologic Oncology   Medical Decision Making I personally spent  TOTAL 50 minutes face-to-face and non-face-to-face in the care of this patient, which includes all pre, intra, and post visit time on the date of service.   ------------  CC: Carcinomatosis  HISTORY OF PRESENT ILLNESS:  Ashley Cooke is a 83 y.o. woman who is seen in consultation at the request of Rosaline Caper, MD for evaluation of imaging findings concerning for carcinomatosis.  Patient was seen by urogynecology on 12/23/2023 for evaluation of prolapse.  During her exam she was noted to have a firm nodule above the umbilicus.  Given this she was ordered for CT abdomen/pelvis which on 12/26/2023 showed ascites and diffuse peritoneal carcinomatosis including a 2 cm soft tissue mass in the umbilicus, 1.3 cm lesion in the right hepatic lobe, 6 mm indeterminate left lower lobe pulmonary nodule, omental soft tissue caking.  There was no enlarged lymph nodes and no particular pelvic mass remarked on.  Patient had a CA125 collected and was elevated at 1704.  CEA was normal.  CA 19-9 was elevated at 72.  Of note, patient has a history of right breast cancer diagnosed in April 2025 and treated with lumpectomy and radiation therapy.  Patient's medical oncologist has ordered her a PET scan and ultrasound-guided paracentesis.  Today patient reports that she first noticed  a mass at her umbilicus starting in October.  She has also noticed since that time abdominal distention, looser bowel movements, early satiety, and fatigue.  She denies pain or change in her urination.  She also denies any postmenopausal bleeding or  spotting.  No weight loss.  In addition to her breast cancer history, patient also has a history of melanoma treated with surgery in 2005.  Patient denies any history of genetic testing.    TREATMENT HISTORY: Oncology History  Malignant neoplasm of lower-inner quadrant of female breast (HCC)  04/28/2023 Mammogram   Screening and diagnostic mammogram showed slightly suspicious 7 mm mass over the 2:30 position of the right breast.   04/29/2023 Pathology Results   Right breast needle core biopsy at 230 showed invasive ductal carcinoma, overall grade 2, ER 100% positive strong staining intensity PR 100% positive strong staining intensity, HER2 1+ Ki-67 of 15%   05/07/2023 Cancer Staging   Staging form: Breast, AJCC 8th Edition - Clinical stage from 05/07/2023: Stage IA (cT1b, cN0, cM0, G2, ER+, PR+, HER2-) - Signed by [name], MD on 05/07/2023 Stage prefix: Initial diagnosis Histologic grading system: 3 grade system   05/13/2023 Surgery   BREAST, RIGHT, LUMPECTOMY:       Invasive ductal carcinoma, 1.0 cm, grade 2  Ductal carcinoma in situ:  Not identified  Margins, invasive:  Negative            Closest, invasive:  0.3 cm from inferior margin  Lymphovascular invasion:  Not identified  Prognostic markers:  ER positive, PR positive, Her2 negative, Ki-67 15%  Other:  Fibrocystic changes.  See oncology table    06/10/2023 - 07/08/2023 Radiation Therapy   Plan Name: Breast_R Site: Breast, Right Technique: 3D Mode: Photon Dose Per Fraction: 5.7 Gy Prescribed Dose (Delivered / Prescribed): 28.5 Gy / 28.5 Gy Prescribed Fxs (Delivered / Prescribed): 5 / 5     PAST MEDICAL HISTORY: Past Medical History:  Diagnosis Date   Arthritis    primarily in my knee & thumbs (10/15/2014)   Breast cancer (HCC)    Chronic kidney disease    Hypercholesterolemia    Hypertension    Melanoma of lower leg (HCC) 05/2003   left   Osteopenia    Seasonal allergies    Squamous carcinoma    all  on the left side:  under eye, nostril, bicept    PAST SURGICAL HISTORY: Past Surgical History:  Procedure Laterality Date   BREAST BIOPSY Right 04/29/2023   US  RT BREAST BX W LOC DEV 1ST LESION IMG BX SPEC US  GUIDE 04/29/2023 [facility]   BREAST BIOPSY  05/12/2023   MM RT RADIOACTIVE SEED LOC MAMMO GUIDE 05/12/2023 [facility]   BREAST LUMPECTOMY WITH RADIOACTIVE SEED LOCALIZATION Right 05/13/2023   Procedure: BREAST LUMPECTOMY WITH RADIOACTIVE SEED LOCALIZATION;  Surgeon: Vanderbilt Ned, MD;  Location: [facility];  Service: General;  Laterality: Right;  RIGHT BREAST SEED LUMPECTOMY   cataract Bilateral 05/21/2017   COLONOSCOPY     DILATION AND CURETTAGE OF UTERUS  1968   JOINT REPLACEMENT Bilateral    knee-     KNEE ARTHROSCOPY Left 05/06/2012   torn miniscus   MELANOMA EXCISION Left 05/2003   leg   MOHS SURGERY Left 2000's   squamous; under eye and nostril   SQUAMOUS CELL CARCINOMA EXCISION Left 2008   upper bicept   TONSILLECTOMY AND ADENOIDECTOMY  1949   TOTAL KNEE ARTHROPLASTY Left 10/14/2014   TOTAL KNEE ARTHROPLASTY Left 10/14/2014  Procedure: TOTAL KNEE ARTHROPLASTY;  Surgeon: Toribio Silos, MD;  Location: [facility] OR;  Service: Orthopedics;  Laterality: Left;   TOTAL KNEE ARTHROPLASTY Right 02/10/2015   Procedure: RIGHT TOTAL KNEE ARTHROPLASTY;  Surgeon: Toribio Silos, MD;  Location: [facility] OR;  Service: Orthopedics;  Laterality: Right;   TOTAL SHOULDER ARTHROPLASTY Right 08/13/2016   Procedure: TOTAL SHOULDER ARTHROPLASTY;  Surgeon: Beverley Evalene BIRCH, MD;  Location: [facility] OR;  Service: Orthopedics;  Laterality: Right;   TOTAL SHOULDER ARTHROPLASTY Left 03/18/2017   Procedure: TOTAL SHOULDER ARTHROPLASTY;  Surgeon: Beverley Evalene BIRCH, MD;  Location: [facility] OR;  Service: Orthopedics;  Laterality: Left;    OB/GYN HISTORY: OB History  Gravida Para Term Preterm AB Living  4 4 4  0 0 4  SAB IAB Ectopic Multiple Live Births  0 0 0 0 4    # Outcome Date GA  Lbr Len/2nd Weight Sex Type Anes PTL Lv  4 Term 1979    M Vag-Spont   LIV  3 Term 79    F Vag-Spont   LIV  2 Term 52    M Vag-Spont   LIV  1 Term 85    F Vag-Spont   LIV      Age at menarche: 19 Age at menopause: 41 Hx of HRT: no Hx of STI: no Last pap: 04/30/23 NILM History of abnormal pap smears: no  SCREENING STUDIES:  Last mammogram: 2025 Last colonoscopy: 2017  MEDICATIONS: Current Medications[1]  ALLERGIES: Allergies[2]  FAMILY HISTORY: Family History  Problem Relation Age of Onset   Hypertension Mother    Hypertension Sister 1   Peripheral Artery Disease Sister    Hypertension Maternal Grandfather    Colon cancer Neg Hx    Breast cancer Neg Hx    BRCA 1/2 Neg Hx    Ovarian cancer Neg Hx    Endometrial cancer Neg Hx     SOCIAL HISTORY: Social History   Socioeconomic History   Marital status: Married    Spouse name: F.Toribio Pais   Number of children: Not on file   Years of education: Not on file   Highest education level: Not on file  Occupational History   Not on file  Tobacco Use   Smoking status: Former    Current packs/day: 0.00    Average packs/day: 0.5 packs/day for 22.0 years (11.0 ttl pk-yrs)    Types: Cigarettes    Start date: 06/21/1957    Quit date: 06/22/1979    Years since quitting: 44.5   Smokeless tobacco: Never  Vaping Use   Vaping status: Never Used  Substance and Sexual Activity   Alcohol  use: Yes    Alcohol /week: 7.0 standard drinks of alcohol     Types: 7 Glasses of wine per week   Drug use: No   Sexual activity: Not Currently    Partners: Male    Birth control/protection: Post-menopausal    Comment: less than 5, older than 16  Other Topics Concern   Not on file  Social History Narrative   Not on file   Social Drivers of Health   Tobacco Use: Medium Risk (12/29/2023)   Patient History    Smoking Tobacco Use: Former    Smokeless Tobacco Use: Never    Passive Exposure: Not on Actuary Strain: Not  on file  Food Insecurity: No Food Insecurity (05/28/2023)   Hunger Vital Sign    Worried About Running Out of Food in the Last Year: Never true    Ran Out of  Food in the Last Year: Never true  Transportation Needs: No Transportation Needs (05/28/2023)   PRAPARE - Administrator, Civil Service (Medical): No    Lack of Transportation (Non-Medical): No  Physical Activity: Not on file  Stress: Not on file  Social Connections: Not on file  Intimate Partner Violence: Not At Risk (05/28/2023)   Humiliation, Afraid, Rape, and Kick questionnaire    Fear of Current or Ex-Partner: No    Emotionally Abused: No    Physically Abused: No    Sexually Abused: No  Depression (PHQ2-9): Low Risk (11/13/2023)   Depression (PHQ2-9)    PHQ-2 Score: 0  Alcohol  Screen: Not on file  Housing: Low Risk (05/28/2023)   Housing Stability Vital Sign    Unable to Pay for Housing in the Last Year: No    Number of Times Moved in the Last Year: 0    Homeless in the Last Year: No  Utilities: Not At Risk (05/28/2023)   AHC Utilities    Threatened with loss of utilities: No  Health Literacy: Not on file    REVIEW OF SYSTEMS: New patient intake form was reviewed.  Complete 10-system review is negative except for the following: Urinary frequency, abdominal stanchion, fatigue  PHYSICAL EXAM: BP (!) 130/59 (BP Location: Right Arm, Patient Position: Sitting)   Pulse 71   Temp 98.3 F (36.8 C) (Oral)   Resp 19   Ht 5' 3 (1.6 m)   Wt 164 lb 9.6 oz (74.7 kg)   LMP 01/21/1977   SpO2 100%   BMI 29.16 kg/m  Constitutional: No acute distress. Neuro/Psych: Alert, oriented.  Head and Neck: Normocephalic, atraumatic. Neck symmetric without masses. Sclera anicteric.  Respiratory: Normal work of breathing. Clear to auscultation bilaterally. Cardiovascular: Regular rate and rhythm, no murmurs, rubs, or gallops. Abdomen: Normoactive bowel sounds.  Abdomen distended with positive fluid wave.  Nodule palpated at the  superior aspect of the umbilicus.  No tenderness to palpation Extremities: Grossly normal range of motion. Warm, well perfused. No edema bilaterally. Skin: No rashes or lesions. Lymphatic: No cervical, supraclavicular, or inguinal adenopathy. Genitourinary: External genitalia without lesions. Urethral meatus without lesions or prolapse. On speculum exam, prolapse present.  Cervix flush with vagina.  Bimanual exam with flush cervix at vaginal apex.  Difficult to palpate uterus or adnexa due to free fluid but no discrete mass palpated.  Rectovaginal exam without mucosal changes or obvious nodularity. Exam chaperoned by Eleanor Epps, NP   LABORATORY AND RADIOLOGIC DATA: Outside medical records were reviewed to synthesize the above history, along with the history and physical obtained during the visit.  Outside laboratory, pathology, and imaging reports were reviewed, with pertinent results below.  I personally reviewed the outside images.  WBC  Date Value Ref Range Status  12/30/2023 8.4 4.0 - 10.5 K/uL Final   Hemoglobin  Date Value Ref Range Status  12/30/2023 14.5 12.0 - 15.0 g/dL Final   HCT  Date Value Ref Range Status  12/30/2023 42.4 36.0 - 46.0 % Final   Platelets  Date Value Ref Range Status  12/30/2023 403 (H) 150 - 400 K/uL Final   Creatinine  Date Value Ref Range Status  12/30/2023 0.97 0.44 - 1.00 mg/dL Final   Creat  Date Value Ref Range Status  04/30/2023 1.05 (H) 0.60 - 0.95 mg/dL Final  95/90/7974 8.89 (H) 0.60 - 0.95 mg/dL Final   AST  Date Value Ref Range Status  12/30/2023 50 (H) 15 - 41 U/L Final  ALT  Date Value Ref Range Status  12/30/2023 39 0 - 44 U/L Final   Cancer Antigen (CA) 125  Date Value Ref Range Status  12/30/2023 1,704.0 (H) 0.0 - 38.1 U/mL Final    Comment:    (NOTE) Roche Diagnostics Electrochemiluminescence Immunoassay (ECLIA) Values obtained with different assay methods or kits cannot be used interchangeably.  Results cannot be  interpreted as absolute evidence of the presence or absence of malignant disease. Performed At: [facility] [address] [place], [place] [id number] Jennette Shorter MD Ey:[id number]    CEA ([facility])  Date Value Ref Range Status  12/30/2023 1.35 0.00 - 5.00 ng/mL Final    Comment:    (NOTE) This test was performed using Beckman Coulter's paramagnetic chemiluminescent immunoassay. Values obtained from different assay methods cannot be used interchangeably. Please note that up to 8% of patients who smoke may see values 5.1-10.0 ng/ml and 1% of patients who smoke may see CEA levels >10.0 ng/ml. Performed at [facility], [address] [place], [place] [address]    Diagnosis  Date Value Ref Range Status  04/30/2023   Final   - Negative for intraepithelial lesion or malignancy (NILM)  10/30/2016   Final   NEGATIVE FOR INTRAEPITHELIAL LESIONS OR MALIGNANCY.    CT ABDOMEN PELVIS W WO CONTRAST 12/26/2023  Narrative CLINICAL DATA:  Abdominal wall mass. Breast carcinoma. * Tracking Code: BO *  EXAM: CT ABDOMEN AND PELVIS WITHOUT AND WITH CONTRAST  TECHNIQUE: Multidetector CT imaging of the abdomen and pelvis was performed following the standard protocol before and following the bolus administration of intravenous contrast.  RADIATION DOSE REDUCTION: This exam was performed according to the departmental dose-optimization program which includes automated exposure control, adjustment of the mA and/or kV according to patient size and/or use of iterative reconstruction technique.  CONTRAST:  OMNIPAQUE  IOHEXOL  300 MG/ML  SOLN  COMPARISON:  None Available.  FINDINGS: Lower Chest: 6 mm noncalcified pulmonary nodule in lateral left lower lobe on image 16/304.  Hepatobiliary: 1.3 x 1.3 cm low-attenuation lesion in the right hepatic lobe on image 34/303. No other hepatic masses identified. Gallbladder is unremarkable. No evidence of biliary  ductal dilatation.  Pancreas:  No mass or inflammatory changes.  Spleen: Within normal limits in size and appearance.  Adrenals/Urinary Tract: No suspicious masses identified. No evidence of ureteral calculi or hydronephrosis.  Stomach/Bowel: Small hiatal hernia. No evidence of obstruction, inflammatory process or abnormal fluid collections. Diverticulosis is seen mainly involving the sigmoid colon, however there is no evidence of diverticulitis.  Vascular/Lymphatic: No pathologically enlarged lymph nodes. No acute vascular findings.  Reproductive: Mild-to-moderate ascites in the abdomen and pelvis. Diffuse peritoneal soft tissue thickening and nodularity, most prominent in the pelvis. Omental soft tissue caking, with 2.3 x 1.7 cm soft tissue mass in the umbilicus. These findings are consistent with peritoneal carcinomatosis.  Other:  None.  Musculoskeletal:  No suspicious bone lesions identified.  IMPRESSION: Ascites and diffuse peritoneal carcinomatosis, including a 2 cm soft tissue mass in the umbilicus.  1.3 cm low-attenuation lesion in right hepatic lobe, suspicious for hepatic metastasis.  6 mm indeterminate left lower lobe pulmonary nodule.  Small hiatal hernia.  Colonic diverticulosis, without radiographic evidence of diverticulitis.  These results will be called to the ordering clinician or representative by the Radiologist Assistant, and communication documented in the PACS or [employer].   Electronically Signed By: Norleen DELENA Kil M.D. On: 12/29/2023 14:21     [1]  Current Outpatient Medications:  acetaminophen  (TYLENOL ) 500 MG tablet, Take 1,000 mg by mouth every 6 (six) hours as needed., Disp: , Rfl:    alendronate (FOSAMAX) 70 MG tablet, Take by mouth., Disp: , Rfl:    aspirin  EC 81 MG tablet, Take 81 mg by mouth daily., Disp: , Rfl:    atorvastatin  (LIPITOR) 10 MG tablet, Take 10 mg by mouth every evening., Disp: , Rfl:    Calcium   Carbonate-Vitamin D  (CALCIUM  600+D PO), Take 1 tablet by mouth daily., Disp: , Rfl:    cholecalciferol  (VITAMIN D ) 1000 UNITS tablet, Take 1,000 Units by mouth daily., Disp: , Rfl:    Docusate Calcium  (STOOL SOFTENER PO), Take by mouth., Disp: , Rfl:    famotidine  (PEPCID ) 20 MG tablet, Take 20 mg by mouth daily., Disp: , Rfl:    Hypromellose (ARTIFICIAL TEARS OP), Apply 1 drop to eye 4 (four) times daily as needed (dry eyes)., Disp: , Rfl:    ketotifen (ZADITOR) 0.025 % ophthalmic solution, Place 1 drop into both eyes 2 (two) times daily as needed (allergies)., Disp: , Rfl:    loratadine  (CLARITIN ) 10 MG tablet, Take 10 mg by mouth daily as needed (seasonal allergies). , Disp: , Rfl:    losartan  (COZAAR ) 50 MG tablet, Take 50 mg by mouth daily., Disp: , Rfl:    meloxicam (MOBIC) 7.5 MG tablet, Take 15 mg by mouth daily as needed., Disp: , Rfl:    Multiple Vitamin (MULTIVITAMIN WITH MINERALS) TABS tablet, Take 1 tablet by mouth daily., Disp: , Rfl:    vitamin C  (ASCORBIC ACID ) 500 MG tablet, Take 500 mg by mouth daily., Disp: , Rfl:  [2]  Allergies Allergen Reactions   Benazepril Hcl Cough   Ciprofloxacin     Other Reaction(s): tendonitis in achilles area

## 2024-01-07 ENCOUNTER — Encounter: Payer: Self-pay | Admitting: Genetic Counselor

## 2024-01-07 ENCOUNTER — Inpatient Hospital Stay

## 2024-01-07 ENCOUNTER — Telehealth: Payer: Self-pay | Admitting: Genetic Counselor

## 2024-01-07 ENCOUNTER — Inpatient Hospital Stay: Admitting: Genetic Counselor

## 2024-01-07 DIAGNOSIS — Z85820 Personal history of malignant melanoma of skin: Secondary | ICD-10-CM

## 2024-01-07 DIAGNOSIS — C50311 Malignant neoplasm of lower-inner quadrant of right female breast: Secondary | ICD-10-CM | POA: Diagnosis not present

## 2024-01-07 DIAGNOSIS — C786 Secondary malignant neoplasm of retroperitoneum and peritoneum: Secondary | ICD-10-CM

## 2024-01-07 LAB — CBC WITH DIFFERENTIAL/PLATELET
Abs Immature Granulocytes: 0.03 K/uL (ref 0.00–0.07)
Basophils Absolute: 0 K/uL (ref 0.0–0.1)
Basophils Relative: 0 %
Eosinophils Absolute: 0.3 K/uL (ref 0.0–0.5)
Eosinophils Relative: 3 %
HCT: 40.3 % (ref 36.0–46.0)
Hemoglobin: 13.8 g/dL (ref 12.0–15.0)
Immature Granulocytes: 0 %
Lymphocytes Relative: 11 %
Lymphs Abs: 1 K/uL (ref 0.7–4.0)
MCH: 30.3 pg (ref 26.0–34.0)
MCHC: 34.2 g/dL (ref 30.0–36.0)
MCV: 88.6 fL (ref 80.0–100.0)
Monocytes Absolute: 0.7 K/uL (ref 0.1–1.0)
Monocytes Relative: 8 %
Neutro Abs: 7.3 K/uL (ref 1.7–7.7)
Neutrophils Relative %: 78 %
Platelets: 420 K/uL — ABNORMAL HIGH (ref 150–400)
RBC: 4.55 MIL/uL (ref 3.87–5.11)
RDW: 14.1 % (ref 11.5–15.5)
WBC: 9.4 K/uL (ref 4.0–10.5)
nRBC: 0 % (ref 0.0–0.2)

## 2024-01-07 NOTE — Telephone Encounter (Signed)
 Called Ms. Ashley Cooke, apparently lab did not draw [employer] genetic test kit. Per patient request, will send saliva kit to her home for sample collection. Discussed process. Patient in agreement with plan.

## 2024-01-07 NOTE — Progress Notes (Signed)
 REFERRING PROVIDER: Eldonna Mays, MD   PRIMARY PROVIDER:  Vernadine Charlie ORN, MD  PRIMARY REASON FOR VISIT:  1. Peritoneal carcinomatosis (HCC)   2. Malignant neoplasm of lower-inner quadrant of right breast of female, estrogen receptor positive (HCC)   3. History of melanoma      HISTORY OF PRESENT ILLNESS:   Ashley Cooke, a 83 y.o. female, was seen for a [place] cancer genetics consultation at the request of Eldonna Mays, MD due to a personal history of cancer.  Ashley Cooke presents to clinic today to discuss the possibility of a hereditary predisposition to cancer, to discuss genetic testing, and to further clarify her future cancer risks, as well as potential cancer risks for family members.   In 2005, around the age of 77, Ms. Ashley Cooke was diagnosed with melanoma of the left shin, treated with resection. She was diagnosed with breast cancer in April of 2025 at the age of 65, s/p lumpectomy and radiation. She was recently diagnosed with peritoneal carcinomatosis, suspected peritoneal, ovarian, or fallopian tube origin. Further work up pending.   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   04/29/2023 Pathology Results   Right breast needle core biopsy at 230 showed invasive ductal carcinoma, overall grade 2, ER 100% positive strong staining intensity PR 100% positive strong staining intensity, HER2 1+ Ki-67 of 15%   05/07/2023 Cancer Staging   Staging form: Breast, AJCC 8th Edition - Clinical stage from 05/07/2023: Stage IA (cT1b, cN0, cM0, G2, ER+, PR+, HER2-) - Signed by [name], MD on 05/07/2023 Stage prefix: Initial diagnosis Histologic grading system: 3 grade system   05/13/2023 Surgery   BREAST, RIGHT, LUMPECTOMY:       Invasive ductal carcinoma, 1.0 cm, grade 2  Ductal  carcinoma in situ:  Not identified  Margins, invasive:  Negative            Closest, invasive:  0.3 cm from inferior margin  Lymphovascular invasion:  Not identified  Prognostic markers:  ER positive, PR positive, Her2 negative, Ki-67 15%  Other:  Fibrocystic changes.  See oncology table    06/10/2023 - 07/08/2023 Radiation Therapy   Plan Name: Breast_R Site: Breast, Right Technique: 3D Mode: Photon Dose Per Fraction: 5.7 Gy Prescribed Dose (Delivered / Prescribed): 28.5 Gy / 28.5 Gy Prescribed Fxs (Delivered / Prescribed): 5 / 5      RELEVANT MEDICAL HISTORY:  Menarche was at age 30.  First live birth at age 22.  Ovaries intact: yes.  Uterus intact: yes.  Menopausal status: postmenopausal.  HRT use: 0 years. Colonoscopy: yes; 2017. Mammogram within the last year: yes.   Past Medical History:  Diagnosis Date   Arthritis    primarily in my knee & thumbs (10/15/2014)   Breast cancer (HCC)    Chronic kidney disease    Hypercholesterolemia    Hypertension    Melanoma of lower leg (HCC) 05/2003   left   Osteopenia    Seasonal allergies    Squamous carcinoma    all on the left side:  under eye, nostril, bicept    Past Surgical History:  Procedure Laterality Date   BREAST BIOPSY Right 04/29/2023   US  RT BREAST BX W LOC DEV 1ST LESION IMG BX SPEC US  GUIDE 04/29/2023 [facility]   BREAST BIOPSY  05/12/2023  MM RT RADIOACTIVE SEED LOC MAMMO GUIDE 05/12/2023 [facility]AMMO[facility]   BREAST LUMPECTOMY WITH RADIOACTIVE SEED LOCALIZATION Right 05/13/2023   Procedure: BREAST LUMPECTOMY WITH RADIOACTIVE SEED LOCALIZATION;  Surgeon: Vanderbilt Ned, MD;  Location: [facility];  Service: General;  Laterality: Right;  RIGHT BREAST SEED LUMPECTOMY   cataract Bilateral 05/21/2017   COLONOSCOPY     DILATION AND CURETTAGE OF UTERUS  1968   JOINT REPLACEMENT Bilateral    knee-     KNEE ARTHROSCOPY Left 05/06/2012   torn miniscus   MELANOMA EXCISION Left 05/2003    leg   MOHS SURGERY Left 2000's   squamous; under eye and nostril   SQUAMOUS CELL CARCINOMA EXCISION Left 2008   upper bicept   TONSILLECTOMY AND ADENOIDECTOMY  1949   TOTAL KNEE ARTHROPLASTY Left 10/14/2014   TOTAL KNEE ARTHROPLASTY Left 10/14/2014   Procedure: TOTAL KNEE ARTHROPLASTY;  Surgeon: Toribio Silos, MD;  Location: [facility] OR;  Service: Orthopedics;  Laterality: Left;   TOTAL KNEE ARTHROPLASTY Right 02/10/2015   Procedure: RIGHT TOTAL KNEE ARTHROPLASTY;  Surgeon: Toribio Silos, MD;  Location: [facility] OR;  Service: Orthopedics;  Laterality: Right;   TOTAL SHOULDER ARTHROPLASTY Right 08/13/2016   Procedure: TOTAL SHOULDER ARTHROPLASTY;  Surgeon: Beverley Evalene BIRCH, MD;  Location: [facility] OR;  Service: Orthopedics;  Laterality: Right;   TOTAL SHOULDER ARTHROPLASTY Left 03/18/2017   Procedure: TOTAL SHOULDER ARTHROPLASTY;  Surgeon: Beverley Evalene BIRCH, MD;  Location: [facility] OR;  Service: Orthopedics;  Laterality: Left;    Social History   Socioeconomic History   Marital status: Married    Spouse name: F.Toribio Pais   Number of children: Not on file   Years of education: Not on file   Highest education level: Not on file  Occupational History   Not on file  Tobacco Use   Smoking status: Former    Current packs/day: 0.00    Average packs/day: 0.5 packs/day for 22.0 years (11.0 ttl pk-yrs)    Types: Cigarettes    Start date: 06/21/1957    Quit date: 06/22/1979    Years since quitting: 44.5   Smokeless tobacco: Never  Vaping Use   Vaping status: Never Used  Substance and Sexual Activity   Alcohol  use: Yes    Alcohol /week: 7.0 standard drinks of alcohol     Types: 7 Glasses of wine per week   Drug use: No   Sexual activity: Not Currently    Partners: Male    Birth control/protection: Post-menopausal    Comment: less than 5, older than 16  Other Topics Concern   Not on file  Social History Narrative   Not on file   Social Drivers of Health   Tobacco Use: Medium Risk (01/05/2024)    Patient History    Smoking Tobacco Use: Former    Smokeless Tobacco Use: Never    Passive Exposure: Not on Actuary Strain: Not on file  Food Insecurity: No Food Insecurity (05/28/2023)   Hunger Vital Sign    Worried About Running Out of Food in the Last Year: Never true    Ran Out of Food in the Last Year: Never true  Transportation Needs: No Transportation Needs (05/28/2023)   PRAPARE - Administrator, Civil Service (Medical): No    Lack of Transportation (Non-Medical): No  Physical Activity: Not on file  Stress: Not on file  Social Connections: Not on file  Depression (PHQ2-9): Low Risk (11/13/2023)   Depression (PHQ2-9)    PHQ-2 Score: 0  Alcohol  Screen: Not on file  Housing: Low Risk (05/28/2023)   Housing Stability Vital Sign    Unable to Pay for Housing in the Last Year: No    Number of Times Moved in the Last Year: 0    Homeless in the Last Year: No  Utilities: Not At Risk (05/28/2023)   AHC Utilities    Threatened with loss of utilities: No  Health Literacy: Not on file     FAMILY HISTORY:  We obtained a detailed, 4-generation family history.  Significant diagnoses are listed below: Family History  Problem Relation Age of Onset   Hypertension Mother    Hypertension Sister 83   Peripheral Artery Disease Sister    Hypertension Maternal Grandfather    Colon cancer Neg Hx    Breast cancer Neg Hx    BRCA 1/2 Neg Hx    Ovarian cancer Neg Hx    Endometrial cancer Neg Hx     Ms. Shisler is unaware of previous family history of genetic testing for hereditary cancer risks. There is no reported Ashkenazi Jewish ancestry. She does not report any family history of cancer.     GENETIC COUNSELING ASSESSMENT: Ms. Blas is a 83 y.o. female with a personal history of cancer which is suggestive of a hereditary predisposition to cancer given her personal history of melanoma, breast and peritoneal cancer. We, therefore, discussed and recommended the following at  today's visit.   DISCUSSION: We discussed that 5 - 10% of cancer is hereditary, with most cases of breast and peritoneal cancer associated with pathogenic variants in BRCA1/2.  There are other genes that can be associated with hereditary breast and ovarian cancer syndromes.  We discussed that testing is beneficial for several reasons including knowing how to follow individuals after completing their treatment, identifying whether potential treatment options such as PARP inhibitors would be beneficial, and understanding if other family members could be at risk for cancer and allowing them to undergo genetic testing.   We reviewed the characteristics, features and inheritance patterns of hereditary cancer syndromes. We also discussed genetic testing, including the appropriate family members to test, the process of testing, insurance coverage and turn-around-time for results. We discussed the implications of a negative, positive, carrier and/or variant of uncertain significant result. We recommended Ms. Kniskern pursue genetic testing for a panel that includes genes associated with breast and gynecologic cancer.   Ms. Glore  was offered a common hereditary cancer panel (40+ genes) and an expanded pan-cancer panel (70+ genes). Ms. Fromer was informed of the benefits and limitations of each panel, including that expanded pan-cancer panels contain genes that do not have clear management guidelines at this point in time.  We also discussed that as the number of genes included on a panel increases, the chances of variants of uncertain significance increases. After considering the benefits and limitations of each gene panel, Ms. Lauter elected to have [facility]'s Multi-Cancer +RNA panel. The [facility] MultiCancers panel includes analysis of the following 70 genes:AIP, ALK, APC, ATM, AXIN2, BAP1, BARD1, BLM, BMPR1A, BRCA1, BRCA2, BRIP1, CDC73, CDH1, CDK4, CDKN1B, CDKN2A, CHEK2, CTNNA1, DICER1, EGFR, EPCAM, FH, FLCN, GREM1,  HOXB13, KIT, LZTR1, MAX, MBD4, MEN1, MET, MITF, MLH1, MSH2, MSH6, MUTYH, NF1, NF2, NTHL1, PALB2, PDGFRA, PMS2, POLD1, POLE, POT1, PRKAR1A, PTCH1, PTEN, RAD51C, RAD51D, RB1, RET, SDHA, SDHAF2, SDHB, SDHC, SDHD, SMAD4, SMARCA4, SMARCB1, SMARCE1, STK11, SUFU, TMEM127, TP53, TSC1, TSC2, VHL. RNA analysis was included for applicable genes.  Based on Ms. Kinser's personal history of cancer, she  meets medical criteria for genetic testing. Despite that she meets criteria, she may still have an out of pocket cost. We discussed that if her out of pocket cost for testing is over $100, the laboratory should contact them to discuss self-pay prices, patient pay assistance programs, if applicable, and other billing options.  We discussed that some people do not want to undergo genetic testing due to fear of genetic discrimination.  A federal law called the Genetic Information Non-Discrimination Act (GINA) of 2008 helps protect individuals against genetic discrimination based on their genetic test results.  It impacts both health insurance and employment.  With health insurance, it protects against increased premiums, being kicked off insurance or being forced to take a test in order to be insured.  For employment it protects against hiring, firing and promoting decisions based on genetic test results.  GINA does not apply to those in the [employer], those who work for companies with less than 15 employees, and new life insurance or long-term disability insurance policies.  Health status due to a cancer diagnosis is not protected under GINA.  PLAN: After considering the risks, benefits, and limitations, Ms. Ballew provided informed consent to pursue genetic testing and the blood sample was sent to [facility] for analysis of the Multi-Cancer +RNA test. Results should be available within approximately 2-3 weeks' time, at which point they will be disclosed by telephone to Ms. Lazard, as will any additional recommendations  warranted by these results. Ms. Kilian will receive a summary of her genetic counseling visit and a copy of her results once available. This information will also be available in Epic.   Lastly, we encouraged Ms. Blumberg to remain in contact with cancer genetics annually so that we can continuously update the family history and inform her of any changes in cancer genetics and testing that may be of benefit for this family.   Ms. Raynor questions were answered to her satisfaction today. Our contact information was provided should additional questions or concerns arise. Thank you for the referral and allowing us  to share in the care of your patient.   Burnard Ogren, MS, [facility] Licensed, [email or web address] phone: [phone]   60 minutes were spent on the date of the encounter in service to the patient including preparation, face-to-face consultation, documentation and care coordination.  The patient was seen alone.  Drs. Gudena and/or Lanny were available to discuss this case as needed.   _______________________________________________________________________ For Office Staff:  Number of people involved in session: 1 Was an Intern/ student involved with case: no

## 2024-01-08 ENCOUNTER — Encounter (HOSPITAL_COMMUNITY)
Admission: RE | Admit: 2024-01-08 | Discharge: 2024-01-08 | Disposition: A | Discharge status: Home / Self Care | Source: Ambulatory Visit | Attending: Hematology and Oncology | Admitting: Hematology and Oncology

## 2024-01-08 DIAGNOSIS — C786 Secondary malignant neoplasm of retroperitoneum and peritoneum: Secondary | ICD-10-CM | POA: Insufficient documentation

## 2024-01-08 LAB — GLUCOSE, CAPILLARY: Glucose-Capillary: 99 mg/dL (ref 70–99)

## 2024-01-08 MED ORDER — FLUDEOXYGLUCOSE F - 18 (FDG) INJECTION
7.5000 | Freq: Once | INTRAVENOUS | Status: AC
  Administered 2024-01-08: 17:00:00 8.1 via INTRAVENOUS

## 2024-01-12 ENCOUNTER — Inpatient Hospital Stay: Admitting: Licensed Clinical Social Worker

## 2024-01-12 NOTE — Progress Notes (Signed)
 [facility] Clinical Social Work  Initial Assessment   Ashley Cooke is a 83 y.o. year old female contacted by phone. Clinical Social Work was referred by new patient protocol for assessment of psychosocial needs.   SDOH (Social Determinants of Health) assessments performed: Yes SDOH Interventions    Flowsheet Row Clinical Support from 01/12/2024 in [facility] A Dept Of [place]. [facility] CONSULT from 05/08/2023 in [facility] Radiation Oncology  SDOH Interventions    Food Insecurity Interventions Intervention Not Indicated Intervention Not Indicated  Housing Interventions Intervention Not Indicated Intervention Not Indicated  Transportation Interventions Intervention Not Indicated Intervention Not Indicated  Utilities Interventions Intervention Not Indicated --    SDOH Screenings   Food Insecurity: No Food Insecurity (01/12/2024)  Housing: Low Risk (01/12/2024)  Transportation Needs: No Transportation Needs (01/12/2024)  Utilities: Not At Risk (01/12/2024)  Depression (PHQ2-9): Low Risk (11/13/2023)  Tobacco Use: Medium Risk (01/05/2024)    PHQ 2/9:    11/13/2023    1:54 PM 05/28/2023    1:00 PM 05/08/2023    1:23 PM  Depression screen PHQ 2/9  Decreased Interest 0 0 0  Down, Depressed, Hopeless 0 1 0  PHQ - 2 Score 0 1 0     Distress Screen completed: No     No data to display            Family/Social Information:  Housing Arrangement: patient lives with spouse Family members/support persons in your life? Family Transportation concerns: no  Employment: Retired.  Financial concerns: No Type of concern: None Food access concerns: no Religious or spiritual practice: Not known Advanced directives: Scientist, Research (physical Sciences) Currently in place:  [facility] Medicare  Coping/ Adjustment to diagnosis: Patient understands treatment plan and what happens next? yes, she is waiting on more information to determine next steps. Voices some trepidation  about what is to come, but is staying busy and present in the moment for now. She & her husband are preparing for family to visit at their vacation home for the holidays. Patient reported stressors: Adjusting to my illness Patient enjoys time with family/ friends Current coping skills/ strengths: Ability for insight , Capable of independent living , Manufacturing systems engineer , Motivation for treatment/growth , and Supportive family/friends     SUMMARY: Current SDOH Barriers:  No major barriers identified today  Clinical Social Work Clinical Goal(s):  No clinical social work goals at this time  Interventions: Discussed common feeling and emotions when being diagnosed with cancer, and the importance of support during treatment Informed patient of the support team roles and support services at [facility] Provided CSW contact information and encouraged patient to call with any questions or concerns   Follow Up Plan: Patient will contact CSW with any support or resource needs Patient verbalizes understanding of plan: Yes    Eleonor Ocon E Eartha Vonbehren, LCSW Clinical Social Worker [facility]

## 2024-01-19 ENCOUNTER — Inpatient Hospital Stay: Admitting: Hematology and Oncology

## 2024-01-19 ENCOUNTER — Other Ambulatory Visit: Payer: Self-pay

## 2024-01-19 DIAGNOSIS — Z01818 Encounter for other preprocedural examination: Secondary | ICD-10-CM

## 2024-01-20 ENCOUNTER — Telehealth: Payer: Self-pay | Admitting: Hematology and Oncology

## 2024-01-20 ENCOUNTER — Telehealth: Payer: Self-pay | Admitting: Oncology

## 2024-01-20 NOTE — Telephone Encounter (Signed)
 I spoke with patient to schedule telephone visit on 01/27/2024 per MD. Patient aware of date/time.

## 2024-01-20 NOTE — Telephone Encounter (Signed)
 Ashley Cooke and scheduled a new patient appointment with Dr. Lonn on 01/23/24 at 2:00 with arrival to the cancer center at 1:30 to check in.

## 2024-01-21 ENCOUNTER — Ambulatory Visit (HOSPITAL_COMMUNITY)
Admission: RE | Admit: 2024-01-21 | Discharge: 2024-01-21 | Disposition: A | Discharge status: Home / Self Care | Source: Ambulatory Visit | Attending: Hematology and Oncology | Admitting: Hematology and Oncology

## 2024-01-21 DIAGNOSIS — C792 Secondary malignant neoplasm of skin: Secondary | ICD-10-CM | POA: Diagnosis not present

## 2024-01-21 DIAGNOSIS — C786 Secondary malignant neoplasm of retroperitoneum and peritoneum: Secondary | ICD-10-CM | POA: Insufficient documentation

## 2024-01-21 NOTE — Procedures (Signed)
 Interventional Radiology Procedure:   Indications: Periumbilical nodule   Procedure: US  guided core biopsy  Findings: Soft tissue nodule in periumbilical area.  Core biopsies obtained with 18 gauge device.    Complications: None     EBL: Minimal   Plan:  Discharge to home.    Ilyas Lipsitz R. Philip, MD  Pager: [phone]

## 2024-01-23 ENCOUNTER — Inpatient Hospital Stay: Attending: Adult Health | Admitting: Hematology and Oncology

## 2024-01-23 VITALS — BP 97/68 | HR 112 | Temp 98.7°F | Resp 18 | Ht 63.0 in | Wt 160.8 lb

## 2024-01-23 DIAGNOSIS — Z853 Personal history of malignant neoplasm of breast: Secondary | ICD-10-CM | POA: Diagnosis not present

## 2024-01-23 DIAGNOSIS — C801 Malignant (primary) neoplasm, unspecified: Secondary | ICD-10-CM | POA: Diagnosis not present

## 2024-01-23 DIAGNOSIS — K769 Liver disease, unspecified: Secondary | ICD-10-CM | POA: Insufficient documentation

## 2024-01-23 DIAGNOSIS — D75839 Thrombocytosis, unspecified: Secondary | ICD-10-CM | POA: Insufficient documentation

## 2024-01-23 DIAGNOSIS — Z87891 Personal history of nicotine dependence: Secondary | ICD-10-CM | POA: Insufficient documentation

## 2024-01-23 DIAGNOSIS — R194 Change in bowel habit: Secondary | ICD-10-CM

## 2024-01-23 DIAGNOSIS — Z923 Personal history of irradiation: Secondary | ICD-10-CM | POA: Insufficient documentation

## 2024-01-23 DIAGNOSIS — C569 Malignant neoplasm of unspecified ovary: Secondary | ICD-10-CM | POA: Insufficient documentation

## 2024-01-23 DIAGNOSIS — Z5111 Encounter for antineoplastic chemotherapy: Secondary | ICD-10-CM | POA: Insufficient documentation

## 2024-01-23 DIAGNOSIS — C786 Secondary malignant neoplasm of retroperitoneum and peritoneum: Secondary | ICD-10-CM | POA: Diagnosis not present

## 2024-01-23 DIAGNOSIS — R935 Abnormal findings on diagnostic imaging of other abdominal regions, including retroperitoneum: Secondary | ICD-10-CM | POA: Diagnosis not present

## 2024-01-23 DIAGNOSIS — R180 Malignant ascites: Secondary | ICD-10-CM | POA: Insufficient documentation

## 2024-01-23 DIAGNOSIS — I952 Hypotension due to drugs: Secondary | ICD-10-CM | POA: Diagnosis not present

## 2024-01-23 DIAGNOSIS — R978 Other abnormal tumor markers: Secondary | ICD-10-CM | POA: Diagnosis not present

## 2024-01-23 MED ORDER — PROCHLORPERAZINE MALEATE 10 MG PO TABS: 10.0000 mg | ORAL_TABLET | Freq: Four times a day (QID) | ORAL | 1 refills | Status: AC | PRN

## 2024-01-23 MED ORDER — DEXAMETHASONE 4 MG PO TABS: ORAL_TABLET | ORAL | 6 refills | Status: AC

## 2024-01-23 MED ORDER — ONDANSETRON HCL 8 MG PO TABS: 8.0000 mg | ORAL_TABLET | Freq: Three times a day (TID) | ORAL | 1 refills | Status: AC | PRN

## 2024-01-24 ENCOUNTER — Encounter: Payer: Self-pay | Admitting: Hematology and Oncology

## 2024-01-24 DIAGNOSIS — K769 Liver disease, unspecified: Secondary | ICD-10-CM | POA: Insufficient documentation

## 2024-01-24 DIAGNOSIS — I952 Hypotension due to drugs: Secondary | ICD-10-CM | POA: Insufficient documentation

## 2024-01-24 DIAGNOSIS — R180 Malignant ascites: Secondary | ICD-10-CM | POA: Insufficient documentation

## 2024-01-24 NOTE — Assessment & Plan Note (Signed)
 This patient had prior history of breast cancer She now presents with changes in bowel habits, signs and symptoms of abdominal carcinomatosis with abnormal imaging study, worrisome for undiagnosed ovarian cancer Pathology is pending She is quite symptomatic and would like to proceed with treatment soon I discussed and reviewed imaging study with the patient and her husband and explained to them why her situation is considered not resectable However, she is an excellent candidate for neoadjuvant chemotherapy approach with the hope that if she has great response to treatment after 3 cycles of neoadjuvant chemotherapy, we can convert her into a surgical candidate She is aware that our discussion today is based on the possibility of diagnosis of ovarian cancer, on the basis of her presentation, positive tumor marker and collaborating imaging studies Final pathology is pending but I am optimistic we will have results back next week We reviewed the NCCN guidelines We discussed the role of chemotherapy. The intent is of curative intent.  We discussed some of the risks, benefits, side-effects of carboplatin & Taxol. Treatment is intravenous, every 3 weeks x 6 cycles  Some of the short term side-effects included, though not limited to, including weight loss, life threatening infections, risk of allergic reactions, need for transfusions of blood products, nausea, vomiting, change in bowel habits, loss of hair, admission to hospital for various reasons, and risks of death.   Long term side-effects are also discussed including risks of infertility, permanent damage to nerve function, hearing loss, chronic fatigue, kidney damage with possibility needing hemodialysis, and rare secondary malignancy including bone marrow disorders.  The patient is aware that the response rates discussed earlier is not guaranteed.  After a long discussion, patient made an informed decision to proceed with the prescribed plan of  care.   Patient education material was dispensed. We discussed premedication with dexamethasone  before chemotherapy. She has decent venous access and would not require port placement I will schedule blood work and chemo education class with the hope that we can get her started on cycle 1 of treatment end of next week We have extensive discussion about dietary changes with frequent small meals and documentation of her eating habits in flowsheet

## 2024-01-24 NOTE — Assessment & Plan Note (Signed)
 She has ascites on exam but not symptomatic Will observe closely for now

## 2024-01-24 NOTE — Assessment & Plan Note (Addendum)
 She is hypotensive today and mildly symptomatic I recommend discontinuation of her blood pressure medications and advised her to track her oral intake of fluids I also advocate for her to drink 80 to 90 ounces of water per day Due to her age and gradual disability, I completed application for temporary disability  parking permit for her to use

## 2024-01-24 NOTE — Assessment & Plan Note (Signed)
 She has abnormal lesion in her liver worrisome for possible hepatic metastasis Overall, she is not symptomatic We will continue to treat her aggressively

## 2024-01-24 NOTE — Progress Notes (Signed)
 [facility] FOLLOW-UP progress notes  Patient Care Team: Tisovec, Charlie ORN, MD as PCP - General (Internal Medicine) [name], MD as Consulting Physician (Hematology and Oncology) Shannon Agent, MD as Consulting Physician (Radiation Oncology) Vanderbilt Ned, MD as Consulting Physician (General Surgery)  CHIEF COMPLAINTS/PURPOSE OF VISIT:  Possible undiagnosed ovarian cancer  HISTORY OF PRESENTING ILLNESS:  Ashley Cooke 84 y.o. female was transferred to my care at the request from her oncologist The patient have diagnosis of breast cancer this year She is now presenting with signs and symptoms of abdominal carcinomatosis, malignant ascites, elevated tumor marker and abnormal imaging study worrisome for possible undiagnosed ovarian cancer, biopsy result is pending She has recent early satiety, abdominal distention, and changes in bowel habits since October.  Denies abnormal postmenopausal bleeding.  She have nausea and poor appetite and actually have collapse/presyncopal episode at home today Majority of her symptoms are gradual in nature starting around October of 2025 I reviewed the patient's records extensive and collaborated the history with the patient. Summary of her history is as follows: Oncology History  Malignant neoplasm of lower-inner quadrant of female breast (HCC)  04/28/2023 Mammogram   Screening and diagnostic mammogram showed slightly suspicious 7 mm mass over the 2:30 position of the right breast.   04/29/2023 Pathology Results   Right breast needle core biopsy at 230 showed invasive ductal carcinoma, overall grade 2, ER 100% positive strong staining intensity PR 100% positive strong staining intensity, HER2 1+ Ki-67 of 15%   05/07/2023 Cancer Staging   Staging form: Breast, AJCC 8th Edition - Clinical stage from 05/07/2023: Stage IA (cT1b, cN0, cM0, G2, ER+, PR+, HER2-) - Signed by [name], MD on 05/07/2023 Stage prefix: Initial  diagnosis Histologic grading system: 3 grade system   05/13/2023 Surgery   BREAST, RIGHT, LUMPECTOMY:       Invasive ductal carcinoma, 1.0 cm, grade 2  Ductal carcinoma in situ:  Not identified  Margins, invasive:  Negative            Closest, invasive:  0.3 cm from inferior margin  Lymphovascular invasion:  Not identified  Prognostic markers:  ER positive, PR positive, Her2 negative, Ki-67 15%  Other:  Fibrocystic changes.  See oncology table    06/10/2023 - 07/08/2023 Radiation Therapy   Plan Name: Breast_R Site: Breast, Right Technique: 3D Mode: Photon Dose Per Fraction: 5.7 Gy Prescribed Dose (Delivered / Prescribed): 28.5 Gy / 28.5 Gy Prescribed Fxs (Delivered / Prescribed): 5 / 5   Ovarian cancer (HCC)  12/26/2023 Imaging   US  BIOPSY (ABDOMINAL RETROPERTIONEAL MASS) Result Date: 01/21/2024 INDICATION: 84 year old with ascites and evidence for peritoneal carcinomatosis. Patient has a superficial periumbilical soft tissue nodule and tissue diagnosis is needed. EXAM: Ultrasound-guided core biopsy of periumbilical soft tissue nodule MEDICATIONS: 1% lidocaine  for local anesthetic ANESTHESIA/SEDATION: None FLUOROSCOPY TIME:  None COMPLICATIONS: None immediate. PROCEDURE: Informed written consent was obtained from the patient after a thorough discussion of the procedural risks, benefits and alternatives. All questions were addressed. A timeout was performed prior to the initiation of the procedure. The periumbilical area was evaluated with ultrasound. Hypoechoic soft tissue nodule was identified. The skin was prepped with chlorhexidine  and sterile field was created. Skin was anesthetized with 1% lidocaine . Small incision was made. Using ultrasound guidance, an 18 gauge core biopsy needle was directed into the lesion. Total of 4 core biopsies were obtained. Specimens placed in formalin. Bandage placed over the puncture site. FINDINGS: Hypoechoic superficial nodule in the periumbilical region.  The nodule roughly measured 2.9 x 2.2 cm. Biopsy needle confirmed within the nodule. No immediate bleeding or hematoma formation. IMPRESSION: Ultrasound-guided core biopsy of the periumbilical superficial nodule. Electronically Signed   By: Juliene Balder M.D.   On: 01/21/2024 16:41   NM PET Image Initial (PI) Skull Base To Thigh Result Date: 01/19/2024 EXAM: PET AND CT SKULL BASE TO MID THIGH 01/08/2024 05:51:34 PM TECHNIQUE: RADIOPHARMACEUTICAL: 8.1 mCi F-18 FDG Uptake time 60 minutes. Glucose level 99 mg/dl. Blood pool SUV 1.8. PET imaging was acquired from the base of the skull to the mid thighs. Non-contrast enhanced computed tomography was obtained for attenuation correction and anatomic localization. COMPARISON: Multiple exams including 12/26/2023 CT scan. CLINICAL HISTORY: Ascites, peritoneal carcinomatosis. FINDINGS: HEAD AND NECK: Bilateral common carotid atheromatous vascular calcification. No metabolically active cervical lymphadenopathy. CHEST: Thoracic aortic, coronary artery, and branch vessel atheromatous vascular calcifications. Clips noted in the right breast. Atherosclerosis. Left lower lobe nodule faintly seen on image 50 series 7, not appreciably hypermetabolic but below sensitive PET CT size thresholds. Old healed left anterior rib fractures. No metabolically active pulmonary nodules. No metabolically active lymphadenopathy. ABDOMEN AND PELVIS: Heavy burden of hypermetabolic peritoneal carcinomatosis with some tumor studding along the liver capsule, moderate ascites, and extensive hypermetabolic tumor depositional on the ascites margins with omental caking, mesenteric tumor spread, and mild periaortic/retroperitoneal adenopathy just below the level of the renal veins. Index tumor nodule along the umbilicus measuring 2.2 x 1.7 cm with maximum SUV 6.3. Index omental caking in the left lower quadrant anteriorly with maximum SUV 6.8. Index tumor deposits in the ascites anterior to the rectum with  maximum SUV 7.6. Given the pattern of tumor spread, organ of origin would likely be mucinous gastrointestinal primary or ovarian primary, although a specific primary mass to account for the findings is not directly identified. Tumor deposition noted along the liver capsule especially posteriorly along the right hepatic lobe. Sigmoid colon diverticulosis. Physiologic activity within the gastrointestinal and genitourinary systems. BONES AND SOFT TISSUE: Bilateral total shoulder arthroplasties. Lumbar spondylosis. Grade 1 anterolisthesis at L4-L5. No abnormal FDG activity localizes to the bones. No metabolically active aggressive osseous lesion. IMPRESSION: 1. Heavy burden of hypermetabolic peritoneal carcinomatosis with moderate ascites, omental caking, mesenteric tumor spread, tumor deposition along the liver capsule, and mild periaortic/retroperitoneal adenopathy just below the level of the renal veins, without a specific primary mass identified distribution favors mucinous gastrointestinal or ovarian primary. 2. Left lower lobe pulmonary nodule, not appreciably hypermetabolic and below sensitive PET-CT size thresholds. 3. Lumbar spondylosis with grade 1 anterolisthesis at L4-5, with bilateral total shoulder arthroplasties and old healed left anterior rib fractures. 4. Additional chronic and incidental findings include atherosclerotic vascular calcifications, sigmoid diverticulosis, and right breast surgical clips. Electronically signed by: Ryan Salvage MD 01/19/2024 01:37 PM EST RP Workstation: [id number]   IR ABDOMEN US  LIMITED Result Date: 12/31/2023 INDICATION: Patient with history of breast cancer and recent CT suggestive of peritoneal carcinomatosis with mild to moderate ascites. She presents today for diagnostic and therapeutic paracentesis. EXAM: ULTRASOUND ABDOMEN LIMITED FINDINGS: Small amount of ascites in the right lower quadrant. Not enough fluid for a paracentesis. IMPRESSION: Small amount of  ascites seen on ultrasound. After discussion of the risks versus benefits of the procedure, decision was made to defer paracentesis at this time. Performed by Laymon Coast, NP under the supervision of Dr. Balder Electronically Signed   By: Juliene Balder M.D.   On: 12/31/2023 14:18   CT ABDOMEN PELVIS W WO CONTRAST Result  Date: 12/29/2023 CLINICAL DATA:  Abdominal wall mass. Breast carcinoma. * Tracking Code: BO * EXAM: CT ABDOMEN AND PELVIS WITHOUT AND WITH CONTRAST TECHNIQUE: Multidetector CT imaging of the abdomen and pelvis was performed following the standard protocol before and following the bolus administration of intravenous contrast. RADIATION DOSE REDUCTION: This exam was performed according to the departmental dose-optimization program which includes automated exposure control, adjustment of the mA and/or kV according to patient size and/or use of iterative reconstruction technique. CONTRAST:  OMNIPAQUE  IOHEXOL  300 MG/ML  SOLN COMPARISON:  None Available. FINDINGS: Lower Chest: 6 mm noncalcified pulmonary nodule in lateral left lower lobe on image 16/304. Hepatobiliary: 1.3 x 1.3 cm low-attenuation lesion in the right hepatic lobe on image 34/303. No other hepatic masses identified. Gallbladder is unremarkable. No evidence of biliary ductal dilatation. Pancreas:  No mass or inflammatory changes. Spleen: Within normal limits in size and appearance. Adrenals/Urinary Tract: No suspicious masses identified. No evidence of ureteral calculi or hydronephrosis. Stomach/Bowel: Small hiatal hernia. No evidence of obstruction, inflammatory process or abnormal fluid collections. Diverticulosis is seen mainly involving the sigmoid colon, however there is no evidence of diverticulitis. Vascular/Lymphatic: No pathologically enlarged lymph nodes. No acute vascular findings. Reproductive: Mild-to-moderate ascites in the abdomen and pelvis. Diffuse peritoneal soft tissue thickening and nodularity, most prominent  in the pelvis. Omental soft tissue caking, with 2.3 x 1.7 cm soft tissue mass in the umbilicus. These findings are consistent with peritoneal carcinomatosis. Other:  None. Musculoskeletal:  No suspicious bone lesions identified. IMPRESSION: Ascites and diffuse peritoneal carcinomatosis, including a 2 cm soft tissue mass in the umbilicus. 1.3 cm low-attenuation lesion in right hepatic lobe, suspicious for hepatic metastasis. 6 mm indeterminate left lower lobe pulmonary nodule. Small hiatal hernia. Colonic diverticulosis, without radiographic evidence of diverticulitis. These results will be called to the ordering clinician or representative by the Radiologist Assistant, and communication documented in the PACS or [employer]. Electronically Signed   By: Norleen DELENA Kil M.D.   On: 12/29/2023 14:21      01/23/2024 Initial Diagnosis   Ovarian cancer (HCC)   01/30/2024 -  Chemotherapy   Patient is on Treatment Plan : OVARIAN Carboplatin (AUC 6) + Paclitaxel (175) q21d X 6 Cycles       MEDICAL HISTORY:  Past Medical History:  Diagnosis Date   Arthritis    primarily in my knee & thumbs (10/15/2014)   Breast cancer (HCC)    Chronic kidney disease    Hypercholesterolemia    Hypertension    Melanoma of lower leg (HCC) 05/2003   left   Osteopenia    Seasonal allergies    Squamous carcinoma    all on the left side:  under eye, nostril, bicept    SURGICAL HISTORY: Past Surgical History:  Procedure Laterality Date   BREAST BIOPSY Right 04/29/2023   US  RT BREAST BX W LOC DEV 1ST LESION IMG BX SPEC US  GUIDE 04/29/2023 [facility] MAMMOGRAPHY   BREAST BIOPSY  05/12/2023   MM RT RADIOACTIVE SEED LOC MAMMO GUIDE 05/12/2023 [facility] MAMMOGRAPHY   BREAST LUMPECTOMY WITH RADIOACTIVE SEED LOCALIZATION Right 05/13/2023   Procedure: BREAST LUMPECTOMY WITH RADIOACTIVE SEED LOCALIZATION;  Surgeon: Vanderbilt Ned, MD;  Location: [facility];  Service: General;  Laterality: Right;  RIGHT BREAST SEED  LUMPECTOMY   cataract Bilateral 05/21/2017   COLONOSCOPY     DILATION AND CURETTAGE OF UTERUS  1968   JOINT REPLACEMENT Bilateral    knee-     KNEE ARTHROSCOPY Left 05/06/2012  torn miniscus   MELANOMA EXCISION Left 05/2003   leg   MOHS SURGERY Left 2000's   squamous; under eye and nostril   SQUAMOUS CELL CARCINOMA EXCISION Left 2008   upper bicept   TONSILLECTOMY AND ADENOIDECTOMY  1949   TOTAL KNEE ARTHROPLASTY Left 10/14/2014   TOTAL KNEE ARTHROPLASTY Left 10/14/2014   Procedure: TOTAL KNEE ARTHROPLASTY;  Surgeon: Toribio Silos, MD;  Location: [facility] OR;  Service: Orthopedics;  Laterality: Left;   TOTAL KNEE ARTHROPLASTY Right 02/10/2015   Procedure: RIGHT TOTAL KNEE ARTHROPLASTY;  Surgeon: Toribio Silos, MD;  Location: [facility] OR;  Service: Orthopedics;  Laterality: Right;   TOTAL SHOULDER ARTHROPLASTY Right 08/13/2016   Procedure: TOTAL SHOULDER ARTHROPLASTY;  Surgeon: Beverley Evalene BIRCH, MD;  Location: [facility] OR;  Service: Orthopedics;  Laterality: Right;   TOTAL SHOULDER ARTHROPLASTY Left 03/18/2017   Procedure: TOTAL SHOULDER ARTHROPLASTY;  Surgeon: Beverley Evalene BIRCH, MD;  Location[facility] OR;  Service: Orthopedics;  Laterality: Left;    SOCIAL HISTORY: Social History   Socioeconomic History   Marital status: Married    Spouse name: F.Toribio Pais   Number of children: Not on file   Years of education: Not on file   Highest education level: Not on file  Occupational History   Not on file  Tobacco Use   Smoking status: Former    Current packs/day: 0.00    Average packs/day: 0.5 packs/day for 22.0 years (11.0 ttl pk-yrs)    Types: Cigarettes    Start date: 06/21/1957    Quit date: 06/22/1979    Years since quitting: 44.6   Smokeless tobacco: Never  Vaping Use   Vaping status: Never Used  Substance and Sexual Activity   Alcohol  use: Yes    Alcohol /week: 7.0 standard drinks of alcohol     Types: 7 Glasses of wine per week   Drug use: No   Sexual activity: Not Currently     Partners: Male    Birth control/protection: Post-menopausal    Comment: less than 5, older than 15  Other Topics Concern   Not on file  Social History Narrative   Not on file   Social Drivers of Health   Tobacco Use: Medium Risk (01/05/2024)   Patient History    Smoking Tobacco Use: Former    Smokeless Tobacco Use: Never    Passive Exposure: Not on Actuary Strain: Not on file  Food Insecurity: No Food Insecurity (01/12/2024)   Epic    Worried About Programme Researcher, Broadcasting/film/video in the Last Year: Never true    Ran Out of Food in the Last Year: Never true  Transportation Needs: No Transportation Needs (01/12/2024)   Epic    Lack of Transportation (Medical): No    Lack of Transportation (Non-Medical): No  Physical Activity: Not on file  Stress: Not on file  Social Connections: Not on file  Intimate Partner Violence: Not At Risk (05/28/2023)   Humiliation, Afraid, Rape, and Kick questionnaire    Fear of Current or Ex-Partner: No    Emotionally Abused: No    Physically Abused: No    Sexually Abused: No  Depression (PHQ2-9): Low Risk (11/13/2023)   Depression (PHQ2-9)    PHQ-2 Score: 0  Alcohol  Screen: Not on file  Housing: Low Risk (01/12/2024)   Epic    Unable to Pay for Housing in the Last Year: No    Number of Times Moved in the Last Year: 0    Homeless in the Last Year: No  Utilities: Not At Risk (01/12/2024)   Epic    Threatened with loss of utilities: No  Health Literacy: Not on file    FAMILY HISTORY: Family History  Problem Relation Age of Onset   Hypertension Mother    Hypertension Sister 21   Peripheral Artery Disease Sister    Hypertension Maternal Grandfather    Colon cancer Neg Hx    Breast cancer Neg Hx    BRCA 1/2 Neg Hx    Ovarian cancer Neg Hx    Endometrial cancer Neg Hx     ALLERGIES:  is allergic to benazepril hcl and ciprofloxacin.  MEDICATIONS:  Current Outpatient Medications  Medication Sig Dispense Refill   dexamethasone   (DECADRON ) 4 MG tablet Take 2 tabs at the night before and 2 tab the morning of chemotherapy, every 3 weeks, by mouth x 6 cycles 24 tablet 6   acetaminophen  (TYLENOL ) 500 MG tablet Take 1,000 mg by mouth every 6 (six) hours as needed.     alendronate (FOSAMAX) 70 MG tablet Take by mouth.     aspirin  EC 81 MG tablet Take 81 mg by mouth daily.     atorvastatin  (LIPITOR) 10 MG tablet Take 10 mg by mouth every evening.     Calcium  Carbonate-Vitamin D  (CALCIUM  600+D PO) Take 1 tablet by mouth daily.     cholecalciferol  (VITAMIN D ) 1000 UNITS tablet Take 1,000 Units by mouth daily.     Docusate Calcium  (STOOL SOFTENER PO) Take by mouth.     famotidine  (PEPCID ) 20 MG tablet Take 20 mg by mouth daily.     Hypromellose (ARTIFICIAL TEARS OP) Apply 1 drop to eye 4 (four) times daily as needed (dry eyes).     ketotifen (ZADITOR) 0.025 % ophthalmic solution Place 1 drop into both eyes 2 (two) times daily as needed (allergies).     loratadine  (CLARITIN ) 10 MG tablet Take 10 mg by mouth daily as needed (seasonal allergies).      losartan  (COZAAR ) 50 MG tablet Take 50 mg by mouth daily.     meloxicam (MOBIC) 7.5 MG tablet Take 15 mg by mouth daily as needed.     Multiple Vitamin (MULTIVITAMIN WITH MINERALS) TABS tablet Take 1 tablet by mouth daily.     ondansetron  (ZOFRAN ) 8 MG tablet Take 1 tablet (8 mg total) by mouth every 8 (eight) hours as needed for nausea or vomiting. Start on the third day after carboplatin. 30 tablet 1   prochlorperazine  (COMPAZINE ) 10 MG tablet Take 1 tablet (10 mg total) by mouth every 6 (six) hours as needed for nausea or vomiting. 30 tablet 1   vitamin C  (ASCORBIC ACID ) 500 MG tablet Take 500 mg by mouth daily.     No current facility-administered medications for this visit.    REVIEW OF SYSTEMS: Constitutional: Denies fevers, chills or abnormal night sweats  All other systems were reviewed with the patient and are negative.  PHYSICAL EXAMINATION: ECOG PERFORMANCE STATUS: 1 -  Symptomatic but completely ambulatory  Vitals:   01/23/24 1344  BP: 97/68  Pulse: (!) 112  Resp: 18  Temp: 98.7 F (37.1 C)  SpO2: 100%   Filed Weights   01/23/24 1344  Weight: 160 lb 12.8 oz (72.9 kg)    GENERAL:alert, no distress and comfortable SKIN: skin color, texture, turgor are normal, no rashes or significant lesions EYES: normal, conjunctiva are pink and non-injected, sclera clear OROPHARYNX:no exudate, normal lips, buccal mucosa, and tongue  NECK: supple, thyroid normal size, non-tender, without nodularity  LYMPH:  no palpable lymphadenopathy in the cervical, axillary or inguinal LUNGS: clear to auscultation and percussion with normal breathing effort HEART: Mild tachycardia, no murmurs without lower extremity edema ABDOMEN:abdomen soft, non-tender and normal bowel sounds.  It is distended with fluid consistent with ascites Musculoskeletal:no cyanosis of digits and no clubbing  PSYCH: alert & oriented x 3 with fluent speech NEURO: no focal motor/sensory deficits  LABORATORY DATA:  I have reviewed the data as listed Lab Results  Component Value Date   WBC 9.4 01/07/2024   HGB 13.8 01/07/2024   HCT 40.3 01/07/2024   MCV 88.6 01/07/2024   PLT 420 (H) 01/07/2024   Recent Labs    04/30/23 1018 12/26/23 1114 12/30/23 0938  NA 140  136  --  133*  K 4.7  4.5  --  4.7  CL 103  101  --  97*  CO2 20  27  --  26  GLUCOSE 91  95  --  96  BUN 19  19  --  19  CREATININE 1.10*  1.05* 1.10* 0.97  CALCIUM  10.2  9.7  --  9.9  GFRNONAA  --   --  57*  PROT 6.9  6.8  --  7.7  ALBUMIN  --   --  4.4  AST 26  29  --  50*  ALT 26  29  --  39  ALKPHOS  --   --  175*  BILITOT 0.3  0.7  --  0.6    RADIOGRAPHIC STUDIES: I have reviewed imaging study with the patient and her husband I have personally reviewed the radiological images as listed and agreed with the findings in the report. US  BIOPSY (ABDOMINAL RETROPERTIONEAL MASS) Result Date:  01/21/2024 INDICATION: 84 year old with ascites and evidence for peritoneal carcinomatosis. Patient has a superficial periumbilical soft tissue nodule and tissue diagnosis is needed. EXAM: Ultrasound-guided core biopsy of periumbilical soft tissue nodule MEDICATIONS: 1% lidocaine  for local anesthetic ANESTHESIA/SEDATION: None FLUOROSCOPY TIME:  None COMPLICATIONS: None immediate. PROCEDURE: Informed written consent was obtained from the patient after a thorough discussion of the procedural risks, benefits and alternatives. All questions were addressed. A timeout was performed prior to the initiation of the procedure. The periumbilical area was evaluated with ultrasound. Hypoechoic soft tissue nodule was identified. The skin was prepped with chlorhexidine  and sterile field was created. Skin was anesthetized with 1% lidocaine . Small incision was made. Using ultrasound guidance, an 18 gauge core biopsy needle was directed into the lesion. Total of 4 core biopsies were obtained. Specimens placed in formalin. Bandage placed over the puncture site. FINDINGS: Hypoechoic superficial nodule in the periumbilical region. The nodule roughly measured 2.9 x 2.2 cm. Biopsy needle confirmed within the nodule. No immediate bleeding or hematoma formation. IMPRESSION: Ultrasound-guided core biopsy of the periumbilical superficial nodule. Electronically Signed   By: Juliene Balder M.D.   On: 01/21/2024 16:41   NM PET Image Initial (PI) Skull Base To Thigh Result Date: 01/19/2024 EXAM: PET AND CT SKULL BASE TO MID THIGH 01/08/2024 05:51:34 PM TECHNIQUE: RADIOPHARMACEUTICAL: 8.1 mCi F-18 FDG Uptake time 60 minutes. Glucose level 99 mg/dl. Blood pool SUV 1.8. PET imaging was acquired from the base of the skull to the mid thighs. Non-contrast enhanced computed tomography was obtained for attenuation correction and anatomic localization. COMPARISON: Multiple exams including 12/26/2023 CT scan. CLINICAL HISTORY: Ascites, peritoneal  carcinomatosis. FINDINGS: HEAD AND NECK: Bilateral common carotid atheromatous vascular calcification. No metabolically active cervical lymphadenopathy. CHEST: Thoracic aortic, coronary artery, and  branch vessel atheromatous vascular calcifications. Clips noted in the right breast. Atherosclerosis. Left lower lobe nodule faintly seen on image 50 series 7, not appreciably hypermetabolic but below sensitive PET CT size thresholds. Old healed left anterior rib fractures. No metabolically active pulmonary nodules. No metabolically active lymphadenopathy. ABDOMEN AND PELVIS: Heavy burden of hypermetabolic peritoneal carcinomatosis with some tumor studding along the liver capsule, moderate ascites, and extensive hypermetabolic tumor depositional on the ascites margins with omental caking, mesenteric tumor spread, and mild periaortic/retroperitoneal adenopathy just below the level of the renal veins. Index tumor nodule along the umbilicus measuring 2.2 x 1.7 cm with maximum SUV 6.3. Index omental caking in the left lower quadrant anteriorly with maximum SUV 6.8. Index tumor deposits in the ascites anterior to the rectum with maximum SUV 7.6. Given the pattern of tumor spread, organ of origin would likely be mucinous gastrointestinal primary or ovarian primary, although a specific primary mass to account for the findings is not directly identified. Tumor deposition noted along the liver capsule especially posteriorly along the right hepatic lobe. Sigmoid colon diverticulosis. Physiologic activity within the gastrointestinal and genitourinary systems. BONES AND SOFT TISSUE: Bilateral total shoulder arthroplasties. Lumbar spondylosis. Grade 1 anterolisthesis at L4-L5. No abnormal FDG activity localizes to the bones. No metabolically active aggressive osseous lesion. IMPRESSION: 1. Heavy burden of hypermetabolic peritoneal carcinomatosis with moderate ascites, omental caking, mesenteric tumor spread, tumor deposition along the  liver capsule, and mild periaortic/retroperitoneal adenopathy just below the level of the renal veins, without a specific primary mass identified distribution favors mucinous gastrointestinal or ovarian primary. 2. Left lower lobe pulmonary nodule, not appreciably hypermetabolic and below sensitive PET-CT size thresholds. 3. Lumbar spondylosis with grade 1 anterolisthesis at L4-5, with bilateral total shoulder arthroplasties and old healed left anterior rib fractures. 4. Additional chronic and incidental findings include atherosclerotic vascular calcifications, sigmoid diverticulosis, and right breast surgical clips. Electronically signed by: Ryan Salvage MD 01/19/2024 01:37 PM EST RP Workstation: [id number]   IR ABDOMEN US  LIMITED Result Date: 12/31/2023 INDICATION: Patient with history of breast cancer and recent CT suggestive of peritoneal carcinomatosis with mild to moderate ascites. She presents today for diagnostic and therapeutic paracentesis. EXAM: ULTRASOUND ABDOMEN LIMITED FINDINGS: Small amount of ascites in the right lower quadrant. Not enough fluid for a paracentesis. IMPRESSION: Small amount of ascites seen on ultrasound. After discussion of the risks versus benefits of the procedure, decision was made to defer paracentesis at this time. Performed by Laymon Coast, NP under the supervision of Dr. Philip Electronically Signed   By: Juliene Philip M.D.   On: 12/31/2023 14:18   CT ABDOMEN PELVIS W WO CONTRAST Result Date: 12/29/2023 CLINICAL DATA:  Abdominal wall mass. Breast carcinoma. * Tracking Code: BO * EXAM: CT ABDOMEN AND PELVIS WITHOUT AND WITH CONTRAST TECHNIQUE: Multidetector CT imaging of the abdomen and pelvis was performed following the standard protocol before and following the bolus administration of intravenous contrast. RADIATION DOSE REDUCTION: This exam was performed according to the departmental dose-optimization program which includes automated exposure control, adjustment  of the mA and/or kV according to patient size and/or use of iterative reconstruction technique. CONTRAST:  100mL OMNIPAQUE  IOHEXOL  300 MG/ML  SOLN COMPARISON:  None Available. FINDINGS: Lower Chest: 6 mm noncalcified pulmonary nodule in lateral left lower lobe on image 16/304. Hepatobiliary: 1.3 x 1.3 cm low-attenuation lesion in the right hepatic lobe on image 34/303. No other hepatic masses identified. Gallbladder is unremarkable. No evidence of biliary ductal dilatation. Pancreas:  No mass or inflammatory  changes. Spleen: Within normal limits in size and appearance. Adrenals/Urinary Tract: No suspicious masses identified. No evidence of ureteral calculi or hydronephrosis. Stomach/Bowel: Small hiatal hernia. No evidence of obstruction, inflammatory process or abnormal fluid collections. Diverticulosis is seen mainly involving the sigmoid colon, however there is no evidence of diverticulitis. Vascular/Lymphatic: No pathologically enlarged lymph nodes. No acute vascular findings. Reproductive: Mild-to-moderate ascites in the abdomen and pelvis. Diffuse peritoneal soft tissue thickening and nodularity, most prominent in the pelvis. Omental soft tissue caking, with 2.3 x 1.7 cm soft tissue mass in the umbilicus. These findings are consistent with peritoneal carcinomatosis. Other:  None. Musculoskeletal:  No suspicious bone lesions identified. IMPRESSION: Ascites and diffuse peritoneal carcinomatosis, including a 2 cm soft tissue mass in the umbilicus. 1.3 cm low-attenuation lesion in right hepatic lobe, suspicious for hepatic metastasis. 6 mm indeterminate left lower lobe pulmonary nodule. Small hiatal hernia. Colonic diverticulosis, without radiographic evidence of diverticulitis. These results will be called to the ordering clinician or representative by the Radiologist Assistant, and communication documented in the PACS or [employer]. Electronically Signed   By: Norleen DELENA Kil M.D.   On: 12/29/2023 14:21     ASSESSMENT & PLAN:  Ovarian cancer (HCC) This patient had prior history of breast cancer She now presents with changes in bowel habits, signs and symptoms of abdominal carcinomatosis with abnormal imaging study, worrisome for undiagnosed ovarian cancer Pathology is pending She is quite symptomatic and would like to proceed with treatment soon I discussed and reviewed imaging study with the patient and her husband and explained to them why her situation is considered not resectable However, she is an excellent candidate for neoadjuvant chemotherapy approach with the hope that if she has great response to treatment after 3 cycles of neoadjuvant chemotherapy, we can convert her into a surgical candidate She is aware that our discussion today is based on the possibility of diagnosis of ovarian cancer, on the basis of her presentation, positive tumor marker and collaborating imaging studies Final pathology is pending but I am optimistic we will have results back next week We reviewed the NCCN guidelines We discussed the role of chemotherapy. The intent is of curative intent.  We discussed some of the risks, benefits, side-effects of carboplatin & Taxol. Treatment is intravenous, every 3 weeks x 6 cycles  Some of the short term side-effects included, though not limited to, including weight loss, life threatening infections, risk of allergic reactions, need for transfusions of blood products, nausea, vomiting, change in bowel habits, loss of hair, admission to hospital for various reasons, and risks of death.   Long term side-effects are also discussed including risks of infertility, permanent damage to nerve function, hearing loss, chronic fatigue, kidney damage with possibility needing hemodialysis, and rare secondary malignancy including bone marrow disorders.  The patient is aware that the response rates discussed earlier is not guaranteed.  After a long discussion, patient made an informed  decision to proceed with the prescribed plan of care.   Patient education material was dispensed. We discussed premedication with dexamethasone  before chemotherapy. She has decent venous access and would not require port placement I will schedule blood work and chemo education class with the hope that we can get her started on cycle 1 of treatment end of next week We have extensive discussion about dietary changes with frequent small meals and documentation of her eating habits in flowsheet  Malignant ascites (HCC) She has ascites on exam but not symptomatic Will observe closely for now  Liver lesion She has abnormal lesion in her liver worrisome for possible hepatic metastasis Overall, she is not symptomatic We will continue to treat her aggressively  Hypotension due to drugs She is hypotensive today and mildly symptomatic I recommend discontinuation of her blood pressure medications and advised her to track her oral intake of fluids I also advocate for her to drink 80 to 90 ounces of water per day Due to her age and gradual disability, I completed application for temporary disability  parking permit for her to use  Orders Placed This Encounter  Procedures   Consent Attestation for Oncology Treatment    The patient is informed of risks, benefits, side-effects of the prescribed oncology treatment. Potential short term and long term side effects and response rates discussed. After a long discussion, the patient made informed decision to proceed.:   Yes   CBC with Differential (Cancer Center Only)    Standing Status:   Future    Expected Date:   01/30/2024    Expiration Date:   01/29/2025   CMP (Cancer Center only)    Standing Status:   Future    Expected Date:   01/30/2024    Expiration Date:   01/29/2025   CBC with Differential (Cancer Center Only)    Standing Status:   Future    Expected Date:   02/20/2024    Expiration Date:   02/19/2025   CMP (Cancer Center only)    Standing Status:    Future    Expected Date:   02/20/2024    Expiration Date:   02/19/2025   CBC with Differential (Cancer Center Only)    Standing Status:   Future    Expected Date:   03/12/2024    Expiration Date:   03/12/2025   CMP (Cancer Center only)    Standing Status:   Future    Expected Date:   03/12/2024    Expiration Date:   03/12/2025   CBC with Differential (Cancer Center Only)    Standing Status:   Future    Expected Date:   04/02/2024    Expiration Date:   04/02/2025   CMP (Cancer Center only)    Standing Status:   Future    Expected Date:   04/02/2024    Expiration Date:   04/02/2025   CBC with Differential (Cancer Center Only)    Standing Status:   Future    Expected Date:   04/23/2024    Expiration Date:   04/23/2025   CMP (Cancer Center only)    Standing Status:   Future    Expected Date:   04/23/2024    Expiration Date:   04/23/2025   CBC with Differential (Cancer Center Only)    Standing Status:   Future    Expected Date:   05/14/2024    Expiration Date:   05/14/2025   CMP (Cancer Center only)    Standing Status:   Future    Expected Date:   05/14/2024    Expiration Date:   05/14/2025   CA 125    Standing Status:   Standing    Number of Occurrences:   11    Expiration Date:   01/23/2025   [facility] PHYSICIAN COMMUNICATION 1    0 Number of doses of carboplatin received at [facility] or outside facility. NG signature of Provider. If patient has received greater than 5 doses of carboplatin, the following pre-medications should be ordered: dexamethasone , diphenhydramine , and formulary histamine H2 antagonist. If patient  cannot tolerate oral histamine H2 antagonist, IV may be given.     All questions were answered. The patient knows to call the clinic with any problems, questions or concerns. The total time spent in the appointment was 100 minutes encounter with patients including review of chart and various tests results, discussions about plan of care and coordination of care plan   Almarie Bedford,  MD 01/24/2024 10:38 AM

## 2024-01-25 ENCOUNTER — Other Ambulatory Visit: Payer: Self-pay

## 2024-01-26 NOTE — Progress Notes (Signed)
 Pharmacist Chemotherapy Monitoring - Initial Assessment    Anticipated start date: 01/30/24   The following has been reviewed per standard work regarding the patient's treatment regimen: The patient's diagnosis, treatment plan and drug doses, and organ/hematologic function Lab orders and baseline tests specific to treatment regimen  The treatment plan start date, drug sequencing, and pre-medications Prior authorization status  Patient's documented medication list, including drug-drug interaction screen and prescriptions for anti-emetics and supportive care specific to the treatment regimen The drug concentrations, fluid compatibility, administration routes, and timing of the medications to be used The patient's access for treatment and lifetime cumulative dose history, if applicable  The patient's medication allergies and previous infusion related reactions, if applicable   Changes made to treatment plan:  N/A  Follow up needed:  Pending authorization for treatment   Harlene JONELLE Nasuti, RPH, 01/26/2024  10:22 AM

## 2024-01-27 ENCOUNTER — Ambulatory Visit (HOSPITAL_COMMUNITY)

## 2024-01-27 ENCOUNTER — Telehealth: Payer: Self-pay | Admitting: Oncology

## 2024-01-27 ENCOUNTER — Inpatient Hospital Stay: Admitting: Hematology and Oncology

## 2024-01-27 LAB — SURGICAL PATHOLOGY

## 2024-01-27 NOTE — Telephone Encounter (Signed)
 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  Let her know that this is what Dr. Lonn suspected and that we will proceed with chemotherapy and then consideration for interval debulking surgery as planned.  Reviewed upcoming appointments.   Also canceled the follow up with Dr. Shannon on 02/12/24 since she will be undergoing chemotherapy.

## 2024-01-28 NOTE — Telephone Encounter (Signed)
 Calling to check in, saliva sample submitted yesterday.

## 2024-01-29 ENCOUNTER — Inpatient Hospital Stay

## 2024-01-29 ENCOUNTER — Inpatient Hospital Stay (HOSPITAL_BASED_OUTPATIENT_CLINIC_OR_DEPARTMENT_OTHER): Admitting: Hematology and Oncology

## 2024-01-29 VITALS — BP 130/82 | HR 90 | Temp 97.8°F | Resp 18 | Ht 63.0 in | Wt 166.4 lb

## 2024-01-29 DIAGNOSIS — C569 Malignant neoplasm of unspecified ovary: Secondary | ICD-10-CM

## 2024-01-29 DIAGNOSIS — D75839 Thrombocytosis, unspecified: Secondary | ICD-10-CM | POA: Diagnosis not present

## 2024-01-29 DIAGNOSIS — Z5111 Encounter for antineoplastic chemotherapy: Secondary | ICD-10-CM | POA: Diagnosis not present

## 2024-01-29 DIAGNOSIS — R180 Malignant ascites: Secondary | ICD-10-CM

## 2024-01-29 LAB — CBC WITH DIFFERENTIAL (CANCER CENTER ONLY)
Abs Immature Granulocytes: 0.05 K/uL (ref 0.00–0.07)
Basophils Absolute: 0 K/uL (ref 0.0–0.1)
Basophils Relative: 0 %
Eosinophils Absolute: 0.2 K/uL (ref 0.0–0.5)
Eosinophils Relative: 2 %
HCT: 36.9 % (ref 36.0–46.0)
Hemoglobin: 13 g/dL (ref 12.0–15.0)
Immature Granulocytes: 1 %
Lymphocytes Relative: 8 %
Lymphs Abs: 0.8 K/uL (ref 0.7–4.0)
MCH: 30.2 pg (ref 26.0–34.0)
MCHC: 35.2 g/dL (ref 30.0–36.0)
MCV: 85.8 fL (ref 80.0–100.0)
Monocytes Absolute: 0.6 K/uL (ref 0.1–1.0)
Monocytes Relative: 6 %
Neutro Abs: 8.9 K/uL — ABNORMAL HIGH (ref 1.7–7.7)
Neutrophils Relative %: 83 %
Platelet Count: 573 K/uL — ABNORMAL HIGH (ref 150–400)
RBC: 4.3 MIL/uL (ref 3.87–5.11)
RDW: 13.2 % (ref 11.5–15.5)
WBC Count: 10.6 K/uL — ABNORMAL HIGH (ref 4.0–10.5)
nRBC: 0 % (ref 0.0–0.2)

## 2024-01-29 LAB — CMP (CANCER CENTER ONLY)
ALT: 22 U/L (ref 0–44)
AST: 33 U/L (ref 15–41)
Albumin: 3.8 g/dL (ref 3.5–5.0)
Alkaline Phosphatase: 132 U/L — ABNORMAL HIGH (ref 38–126)
Anion gap: 11 (ref 5–15)
BUN: 18 mg/dL (ref 8–23)
CO2: 24 mmol/L (ref 22–32)
Calcium: 9.3 mg/dL (ref 8.9–10.3)
Chloride: 94 mmol/L — ABNORMAL LOW (ref 98–111)
Creatinine: 0.89 mg/dL (ref 0.44–1.00)
GFR, Estimated: >60 mL/min
Glucose, Bld: 111 mg/dL — ABNORMAL HIGH (ref 70–99)
Potassium: 4.4 mmol/L (ref 3.5–5.1)
Sodium: 129 mmol/L — ABNORMAL LOW (ref 135–145)
Total Bilirubin: 0.4 mg/dL (ref 0.0–1.2)
Total Protein: 7.1 g/dL (ref 6.5–8.1)

## 2024-01-29 NOTE — Progress Notes (Signed)
 [facility] OFFICE PROGRESS NOTE  Patient Care Team: Tisovec, Charlie ORN, MD as PCP - General (Internal Medicine) [name], MD as Consulting Physician (Hematology and Oncology) Shannon Agent, MD as Consulting Physician (Radiation Oncology) Vanderbilt Ned, MD as Consulting Physician (General Surgery)  Assessment & Plan Malignant neoplasm of ovary, unspecified laterality [facility]) This patient had prior history of breast cancer She now presents with changes in bowel habits, signs and symptoms of abdominal carcinomatosis with abnormal imaging study, worrisome for undiagnosed ovarian cancer Pathology confirmed diagnosis of cancer from GYN primary, high-grade serous carcinoma, the tumor is diffusely and strongly positive for CK7, p16 and GYN marker PAX8, positive for ER receptor, p53 mutated  She is aware of the role of neoadjuvant chemotherapy If she has great response to treatment after 3 cycles of neoadjuvant chemotherapy, we can convert her into a surgical candidate She will start treatment this week I plan to repeat imaging study after 3 cycles of treatment Malignant ascites (HCC) She has ascites on exam but not symptomatic Will observe closely for now Thrombocytosis The cause of her thrombocytosis is likely due to untreated cancer Monitor closely  No orders of the defined types were placed in this encounter.    Ashley Bedford, MD  INTERVAL HISTORY: she returns for surveillance follow-up to be seen prior to start date of treatment We received copy of her biopsy report which confirmed metastatic high-grade serous carcinoma She is borderline symptomatic with abdominal distention but no nausea or pain Denies recent constipation I reviewed side effects of treatment with her and husband again  PHYSICAL EXAMINATION: ECOG PERFORMANCE STATUS: 1 - Symptomatic but completely ambulatory  Vitals:   01/29/24 1036  BP: 130/82  Pulse: 90  Resp: 18  Temp: 97.8 F (36.6 C)   SpO2: 100%   Filed Weights   01/29/24 1036  Weight: 166 lb 6.4 oz (75.5 kg)    Relevant data reviewed during this visit included pathology report, CBC, CMP

## 2024-01-29 NOTE — Assessment & Plan Note (Addendum)
 She has ascites on exam but not symptomatic Will observe closely for now

## 2024-01-29 NOTE — Assessment & Plan Note (Addendum)
 This patient had prior history of breast cancer She now presents with changes in bowel habits, signs and symptoms of abdominal carcinomatosis with abnormal imaging study, worrisome for undiagnosed ovarian cancer Pathology confirmed diagnosis of cancer from GYN primary, high-grade serous carcinoma, the tumor is diffusely and strongly positive for CK7, p16 and GYN marker PAX8, positive for ER receptor, p53 mutated  She is aware of the role of neoadjuvant chemotherapy If she has great response to treatment after 3 cycles of neoadjuvant chemotherapy, we can convert her into a surgical candidate She will start treatment this week I plan to repeat imaging study after 3 cycles of treatment

## 2024-01-29 NOTE — Assessment & Plan Note (Addendum)
 The cause of her thrombocytosis is likely due to untreated cancer Monitor closely

## 2024-01-30 ENCOUNTER — Inpatient Hospital Stay

## 2024-01-30 ENCOUNTER — Other Ambulatory Visit: Payer: Self-pay

## 2024-01-30 VITALS — BP 149/85 | HR 83 | Temp 97.4°F | Resp 16

## 2024-01-30 DIAGNOSIS — C569 Malignant neoplasm of unspecified ovary: Secondary | ICD-10-CM

## 2024-01-30 DIAGNOSIS — Z5111 Encounter for antineoplastic chemotherapy: Secondary | ICD-10-CM | POA: Diagnosis not present

## 2024-01-30 LAB — CA 125: Cancer Antigen (CA) 125: 1291 U/mL — ABNORMAL HIGH (ref 0.0–38.1)

## 2024-01-30 MED ORDER — SODIUM CHLORIDE 0.9 % IV SOLN
412.8000 mg | Freq: Once | INTRAVENOUS | Status: AC
  Administered 2024-01-30: 410 mg via INTRAVENOUS
  Filled 2024-01-30: qty 41

## 2024-01-30 MED ORDER — DIPHENHYDRAMINE HCL 50 MG/ML IJ SOLN
12.5000 mg | Freq: Once | INTRAMUSCULAR | Status: AC
  Administered 2024-01-30: 12.5 mg via INTRAVENOUS
  Filled 2024-01-30: qty 1

## 2024-01-30 MED ORDER — DEXAMETHASONE SOD PHOSPHATE PF 10 MG/ML IJ SOLN
10.0000 mg | Freq: Once | INTRAMUSCULAR | Status: AC
  Administered 2024-01-30: 10 mg via INTRAVENOUS
  Filled 2024-01-30: qty 1

## 2024-01-30 MED ORDER — SODIUM CHLORIDE 0.9 % IV SOLN
150.0000 mg | Freq: Once | INTRAVENOUS | Status: AC
  Administered 2024-01-30: 150 mg via INTRAVENOUS
  Filled 2024-01-30: qty 150

## 2024-01-30 MED ORDER — SODIUM CHLORIDE 0.9 % IV SOLN: INTRAVENOUS | Status: DC

## 2024-01-30 MED ORDER — FAMOTIDINE IN NACL 20-0.9 MG/50ML-% IV SOLN
20.0000 mg | Freq: Once | INTRAVENOUS | Status: AC
  Administered 2024-01-30: 20 mg via INTRAVENOUS
  Filled 2024-01-30: qty 50

## 2024-01-30 MED ORDER — PALONOSETRON HCL INJECTION 0.25 MG/5ML
0.2500 mg | Freq: Once | INTRAVENOUS | Status: AC
  Administered 2024-01-30: 0.25 mg via INTRAVENOUS
  Filled 2024-01-30: qty 5

## 2024-01-30 MED ORDER — SODIUM CHLORIDE 0.9 % IV SOLN
175.0000 mg/m2 | Freq: Once | INTRAVENOUS | Status: AC
  Administered 2024-01-30: 318 mg via INTRAVENOUS
  Filled 2024-01-30: qty 53

## 2024-01-30 MED ORDER — SODIUM CHLORIDE 0.9 % IV SOLN
120.0000 mg | Freq: Once | INTRAVENOUS | Status: AC
  Administered 2024-01-30: 120 mg via INTRAVENOUS
  Filled 2024-01-30: qty 20

## 2024-01-30 NOTE — Patient Instructions (Signed)
 [facility] - A DEPT OF [facility]  Discharge Instructions: Thank you for choosing [facility] to provide your oncology and hematology care.   If you have a lab appointment with the Cancer Center, please go directly to the Cancer Center and check in at the registration area.   Wear comfortable clothing and clothing appropriate for easy access to any Portacath or PICC line.   We strive to give you quality time with your provider. You may need to reschedule your appointment if you arrive late (15 or more minutes).  Arriving late affects you and other patients whose appointments are after yours.  Also, if you miss three or more appointments without notifying the office, you may be dismissed from the clinic at the provider's discretion.      For prescription refill requests, have your pharmacy contact our office and allow 72 hours for refills to be completed.    Today you received the following chemotherapy and/or immunotherapy agents: Paclitaxel  (Taxol ) & Carboplatin  (Paraplatin )      To help prevent nausea and vomiting after your treatment, we encourage you to take your nausea medication as directed.  BELOW ARE SYMPTOMS THAT SHOULD BE REPORTED IMMEDIATELY: *FEVER GREATER THAN 100.4 F (38 C) OR HIGHER *CHILLS OR SWEATING *NAUSEA AND VOMITING THAT IS NOT CONTROLLED WITH YOUR NAUSEA MEDICATION *UNUSUAL SHORTNESS OF BREATH *UNUSUAL BRUISING OR BLEEDING *URINARY PROBLEMS (pain or burning when urinating, or frequent urination) *BOWEL PROBLEMS (unusual diarrhea, constipation, pain near the anus) TENDERNESS IN MOUTH AND THROAT WITH OR WITHOUT PRESENCE OF ULCERS (sore throat, sores in mouth, or a toothache) UNUSUAL RASH, SWELLING OR PAIN  UNUSUAL VAGINAL DISCHARGE OR ITCHING   Items with * indicate a potential emergency and should be followed up as soon as possible or go to the Emergency Department if any problems should occur.  Please show the  CHEMOTHERAPY ALERT CARD or IMMUNOTHERAPY ALERT CARD at check-in to the Emergency Department and triage nurse.  Should you have questions after your visit or need to cancel or reschedule your appointment, please contact [facility] - A DEPT OF [facility]  Dept: [phone]  and follow the prompts.  Office hours are 8:00 a.m. to 4:30 p.m. Monday - Friday. Please note that voicemails left after 4:00 p.m. may not be returned until the following business day.  We are closed weekends and major holidays. You have access to a nurse at all times for urgent questions. Please call the main number to the clinic Dept: [phone] and follow the prompts.   For any non-urgent questions, you may also contact your provider using MyChart. We now offer e-Visits for anyone 41 and older to request care online for non-urgent symptoms. For details visit [email or web address].   Also download the MyChart app! Go to the app store, search MyChart, open the app, select [facility], and log in with your MyChart username and password.

## 2024-01-30 NOTE — Progress Notes (Signed)
 Pt iced hands and feet off and on, but not the entire time during her taxol  infusion. Pt educated on peripheral neuropathy risk and to monitor for symptoms. Pt educated on use of compression gloves/ socks and wishes to try that instead next time.

## 2024-01-30 NOTE — Progress Notes (Signed)
 At 1330, RN noticed Taxol  leaking from line. Pt had received a total of via titration until med was titrated to max rate (169mL/hr) beginning at ~1200. Concentration of Taxol  bag is 318mg /551mLs (~0.57mg /mL). Patient had received ~230mLs over the 90 mins med had been running at max rate for a total of (197mg ) administered. TV-administered volume= remaining in bag= ~120mg .  Order entered for Taxol  120mg  for pt to receive remainder of dose.  Asser Lucena, PharmD, MBA

## 2024-02-02 ENCOUNTER — Telehealth: Payer: Self-pay

## 2024-02-02 ENCOUNTER — Other Ambulatory Visit: Payer: Self-pay | Admitting: Oncology

## 2024-02-02 ENCOUNTER — Inpatient Hospital Stay: Admitting: Adult Health

## 2024-02-02 ENCOUNTER — Encounter: Payer: Self-pay | Admitting: *Deleted

## 2024-02-02 NOTE — Telephone Encounter (Signed)
 Called and given below message. She verbalized udnerstnaidng and will call the office back for questions/concerns. She will try OTC tylenol  if needed.

## 2024-02-02 NOTE — Telephone Encounter (Signed)
 Returned her call. She started having abdominal gas pains yesterday that became more uncomfortable last night. She took gas-x that did not help with gas pains.. Denies constipation. Last bm today that was loose. She took Miralax  Saturday and Sunday as instructed. She stopped the mirlax today and normally does not take miralax . She is able to eat and drink. She is complaining of lack of appetite and will eat 6 small meals a day to see if that helps.  She will stop the Miralax  for now. She is asking if Dr. Lonn has any other recommendations.

## 2024-02-02 NOTE — Progress Notes (Signed)
 Gynecologic Oncology Multi-Disciplinary Disposition Conference Note  Date of the Conference: 02/02/2024  Patient Name: Ashley Cooke  Referring Provider: Dr. Marilynne Primary GYN Oncologist: Dr. 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 Disposition is to neoadjuvant chemotherapy with interval imaging and consideration for debulking surgery after 4 cycles..   This Multidisciplinary conference took place involving physicians from Gynecologic Oncology, Medical Oncology, Radiation Oncology, Pathology, Radiology along with the Gynecologic Oncology Nurse Practitioner and Gynecologic Oncology Nurse Navigator.  Comprehensive assessment of the patient's malignancy, staging, need for surgery, chemotherapy, radiation therapy, and need for further testing were reviewed. Supportive measures, both inpatient and following discharge were also discussed. The recommended plan of care is documented. Greater than 35 minutes were spent correlating and coordinating this patient's care.

## 2024-02-02 NOTE — Telephone Encounter (Signed)
 The cramp is from her disease, will likely resolve Do not take gas x Lack of appetite is normal, recommend continue best effort with frequent small meals OK to stop miralax 

## 2024-02-02 NOTE — Telephone Encounter (Signed)
-----   Message from Nurse Rivka SQUIBB, RN sent at 01/30/2024  4:57 PM EST ----- Regarding: Dr. Lonn- first time taxol  and carbo call back Pt completed first time taxol  and carboplatin  tx with titrations and no complaints. Pt iced hands and feet off and on, but not the entire time during her taxol . Pt educated on peripheral neuropathy risk and to monitor for symptoms. AVS reviewed, VSS, and ambulatory to lobby to be with husband.

## 2024-02-04 ENCOUNTER — Ambulatory Visit: Payer: Self-pay | Admitting: Genetic Counselor

## 2024-02-04 ENCOUNTER — Telehealth: Payer: Self-pay | Admitting: Oncology

## 2024-02-04 ENCOUNTER — Telehealth: Payer: Self-pay | Admitting: Genetic Counselor

## 2024-02-04 DIAGNOSIS — Z1379 Encounter for other screening for genetic and chromosomal anomalies: Secondary | ICD-10-CM | POA: Insufficient documentation

## 2024-02-04 NOTE — Telephone Encounter (Signed)
 Called Ashley Cooke and scheduled a follow up appointment with Dr. Eldonna on 03/22/24 at 3:30 to discuss surgery.

## 2024-02-04 NOTE — Telephone Encounter (Signed)
 I contacted  Ashley Cooke to discuss her genetic testing results. No pathogenic variants were identified in the 70 genes analyzed. 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 Discussed that we do not know why she has cancer and that it could be due to a different gene that we are not testing, or maybe our current technology may not be able to pick something up. Discussed that a VUS is treated as a negative and that it should not be used to inform testing of family members or medical decision making. It will be important for her to keep in contact with genetics to keep up with whether additional testing may be needed. Detailed clinic note to follow.   The test report will be scanned into EPIC and will be located under the Molecular Pathology section of the Results Review tab.  A portion of the result report is included below for reference.

## 2024-02-05 ENCOUNTER — Other Ambulatory Visit: Payer: Self-pay

## 2024-02-05 ENCOUNTER — Inpatient Hospital Stay: Admitting: Hematology and Oncology

## 2024-02-06 ENCOUNTER — Telehealth: Payer: Self-pay

## 2024-02-06 ENCOUNTER — Encounter: Payer: Self-pay | Admitting: Hematology and Oncology

## 2024-02-06 NOTE — Progress Notes (Signed)
 HPI:  Ashley Cooke was previously seen in the [facility] due to a personal and family history of cancer and concerns regarding a hereditary predisposition to cancer. Please refer to our prior cancer genetics clinic note for more information regarding our discussion, assessment and recommendations, at the time. Ashley Cooke recent genetic test results were disclosed to her, as were recommendations warranted by these results. These results and recommendations are discussed in more detail below.  Results were disclosed by telephone on 02/04/24.   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   04/29/2023 Pathology Results   Right breast needle core biopsy at 230 showed invasive ductal carcinoma, overall grade 2, ER 100% positive strong staining intensity PR 100% positive strong staining intensity, HER2 1+ Ki-67 of 15%   05/07/2023 Cancer Staging   Staging form: Breast, AJCC 8th Edition - Clinical stage from 05/07/2023: Stage IA (cT1b, cN0, cM0, G2, ER+, PR+, HER2-) - Signed by Ashley Ash, MD on 05/07/2023 Stage prefix: Initial diagnosis Histologic grading system: 3 grade system   05/13/2023 Surgery   BREAST, RIGHT, LUMPECTOMY:       Invasive ductal carcinoma, 1.0 cm, grade 2  Ductal carcinoma in situ:  Not identified  Margins, invasive:  Negative            Closest, invasive:  0.3 cm from inferior margin  Lymphovascular invasion:  Not identified  Prognostic markers:  ER positive, PR positive, Her2 negative, Ki-67 15%  Other:  Fibrocystic changes.  See oncology table    06/10/2023 - 07/08/2023 Radiation Therapy   Plan Name: Breast_R Site: Breast, Right Technique: 3D Mode: Photon Dose Per Fraction: 5.7 Gy Prescribed Dose (Delivered / Prescribed): 28.5 Gy / 28.5 Gy Prescribed Fxs  (Delivered / Prescribed): 5 / 5   02/03/2024 Genetic Testing   Negative genetic testing. VUS in MITF c.490C>A. Report date is 02/03/2024  [facility]'s MultiCancer +RNA panel includes analysis of the following 70 genes: AIP, ALK, APC, ATM, AXIN2, BAP1, BARD1, BLM, BMPR1A, BRCA1, BRCA2, BRIP1, CDC73, CDH1, CDK4, CDKN1B, CDKN2A, CHEK2, CTNNA1, DICER1, EGFR, EPCAM, FH, FLCN, GREM1, HOXB13, KIT, LZTR1, MAX, MBD4, MEN1, MET, MITF, MLH1, MSH2, MSH6, MUTYH, NF1, NF2, NTHL1, PALB2, PDGFRA, PMS2, POLD1, POLE, POT1, PRKAR1A, PTCH1, PTEN, RAD51C, RAD51D, RB1, RET, SDHA, SDHAF2, SDHB, SDHC, SDHD, SMAD4, SMARCA4, SMARCB1, SMARCE1, STK11, SUFU, TMEM127, TP53, TSC1, TSC2, VHL.      Ovarian cancer (HCC)  12/26/2023 Imaging   US  BIOPSY (ABDOMINAL RETROPERTIONEAL MASS) Result Date: 01/21/2024 INDICATION: 84 year old with ascites and evidence for peritoneal carcinomatosis. Patient has a superficial periumbilical soft tissue nodule and tissue diagnosis is needed. EXAM: Ultrasound-guided core biopsy of periumbilical soft tissue nodule MEDICATIONS: 1% lidocaine  for local anesthetic ANESTHESIA/SEDATION: None FLUOROSCOPY TIME:  None COMPLICATIONS: None immediate. PROCEDURE: Informed written consent was obtained from the patient after a thorough discussion of the procedural risks, benefits and alternatives. All questions were addressed. A timeout was performed prior to the initiation of the procedure. The periumbilical area was evaluated with ultrasound. Hypoechoic soft tissue nodule was identified. The skin was prepped with chlorhexidine  and sterile field was created. Skin was anesthetized with 1% lidocaine . Small incision was made. Using ultrasound guidance, an 18 gauge core biopsy needle was directed into the lesion. Total of 4 core biopsies were obtained. Specimens placed in formalin. Bandage placed over the puncture  site. FINDINGS: Hypoechoic superficial nodule in the periumbilical region. The nodule roughly measured 2.9 x 2.2 cm.  Biopsy needle confirmed within the nodule. No immediate bleeding or hematoma formation. IMPRESSION: Ultrasound-guided core biopsy of the periumbilical superficial nodule. Electronically Signed   By: Juliene Balder M.D.   On: 01/21/2024 16:41   NM PET Image Initial (PI) Skull Base To Thigh Result Date: 01/19/2024 EXAM: PET AND CT SKULL BASE TO MID THIGH 01/08/2024 05:51:34 PM TECHNIQUE: RADIOPHARMACEUTICAL: 8.1 mCi F-18 FDG Uptake time 60 minutes. Glucose level 99 mg/dl. Blood pool SUV 1.8. PET imaging was acquired from the base of the skull to the mid thighs. Non-contrast enhanced computed tomography was obtained for attenuation correction and anatomic localization. COMPARISON: Multiple exams including 12/26/2023 CT scan. CLINICAL HISTORY: Ascites, peritoneal carcinomatosis. FINDINGS: HEAD AND NECK: Bilateral common carotid atheromatous vascular calcification. No metabolically active cervical lymphadenopathy. CHEST: Thoracic aortic, coronary artery, and branch vessel atheromatous vascular calcifications. Clips noted in the right breast. Atherosclerosis. Left lower lobe nodule faintly seen on image 50 series 7, not appreciably hypermetabolic but below sensitive PET CT size thresholds. Old healed left anterior rib fractures. No metabolically active pulmonary nodules. No metabolically active lymphadenopathy. ABDOMEN AND PELVIS: Heavy burden of hypermetabolic peritoneal carcinomatosis with some tumor studding along the liver capsule, moderate ascites, and extensive hypermetabolic tumor depositional on the ascites margins with omental caking, mesenteric tumor spread, and mild periaortic/retroperitoneal adenopathy just below the level of the renal veins. Index tumor nodule along the umbilicus measuring 2.2 x 1.7 cm with maximum SUV 6.3. Index omental caking in the left lower quadrant anteriorly with maximum SUV 6.8. Index tumor deposits in the ascites anterior to the rectum with maximum SUV 7.6. Given the pattern of tumor  spread, organ of origin would likely be mucinous gastrointestinal primary or ovarian primary, although a specific primary mass to account for the findings is not directly identified. Tumor deposition noted along the liver capsule especially posteriorly along the right hepatic lobe. Sigmoid colon diverticulosis. Physiologic activity within the gastrointestinal and genitourinary systems. BONES AND SOFT TISSUE: Bilateral total shoulder arthroplasties. Lumbar spondylosis. Grade 1 anterolisthesis at L4-L5. No abnormal FDG activity localizes to the bones. No metabolically active aggressive osseous lesion. IMPRESSION: 1. Heavy burden of hypermetabolic peritoneal carcinomatosis with moderate ascites, omental caking, mesenteric tumor spread, tumor deposition along the liver capsule, and mild periaortic/retroperitoneal adenopathy just below the level of the renal veins, without a specific primary mass identified distribution favors mucinous gastrointestinal or ovarian primary. 2. Left lower lobe pulmonary nodule, not appreciably hypermetabolic and below sensitive PET-CT size thresholds. 3. Lumbar spondylosis with grade 1 anterolisthesis at L4-5, with bilateral total shoulder arthroplasties and old healed left anterior rib fractures. 4. Additional chronic and incidental findings include atherosclerotic vascular calcifications, sigmoid diverticulosis, and right breast surgical clips. Electronically signed by: Ryan Salvage MD 01/19/2024 01:37 PM EST RP Workstation: [id number]   IR ABDOMEN US  LIMITED Result Date: 12/31/2023 INDICATION: Patient with history of breast cancer and recent CT suggestive of peritoneal carcinomatosis with mild to moderate ascites. She presents today for diagnostic and therapeutic paracentesis. EXAM: ULTRASOUND ABDOMEN LIMITED FINDINGS: Small amount of ascites in the right lower quadrant. Not enough fluid for a paracentesis. IMPRESSION: Small amount of ascites seen on ultrasound. After discussion  of the risks versus benefits of the procedure, decision was made to defer paracentesis at this time. Performed by Laymon Coast, NP under the supervision of Dr. Balder Electronically Signed   By: Juliene Balder M.D.   On: 12/31/2023  14:18   CT ABDOMEN PELVIS W WO CONTRAST Result Date: 12/29/2023 CLINICAL DATA:  Abdominal wall mass. Breast carcinoma. * Tracking Code: BO * EXAM: CT ABDOMEN AND PELVIS WITHOUT AND WITH CONTRAST TECHNIQUE: Multidetector CT imaging of the abdomen and pelvis was performed following the standard protocol before and following the bolus administration of intravenous contrast. RADIATION DOSE REDUCTION: This exam was performed according to the departmental dose-optimization program which includes automated exposure control, adjustment of the mA and/or kV according to patient size and/or use of iterative reconstruction technique. CONTRAST:  OMNIPAQUE  IOHEXOL  300 MG/ML  SOLN COMPARISON:  None Available. FINDINGS: Lower Chest: 6 mm noncalcified pulmonary nodule in lateral left lower lobe on image 16/304. Hepatobiliary: 1.3 x 1.3 cm low-attenuation lesion in the right hepatic lobe on image 34/303. No other hepatic masses identified. Gallbladder is unremarkable. No evidence of biliary ductal dilatation. Pancreas:  No mass or inflammatory changes. Spleen: Within normal limits in size and appearance. Adrenals/Urinary Tract: No suspicious masses identified. No evidence of ureteral calculi or hydronephrosis. Stomach/Bowel: Small hiatal hernia. No evidence of obstruction, inflammatory process or abnormal fluid collections. Diverticulosis is seen mainly involving the sigmoid colon, however there is no evidence of diverticulitis. Vascular/Lymphatic: No pathologically enlarged lymph nodes. No acute vascular findings. Reproductive: Mild-to-moderate ascites in the abdomen and pelvis. Diffuse peritoneal soft tissue thickening and nodularity, most prominent in the pelvis. Omental soft tissue caking,  with 2.3 x 1.7 cm soft tissue mass in the umbilicus. These findings are consistent with peritoneal carcinomatosis. Other:  None. Musculoskeletal:  No suspicious bone lesions identified. IMPRESSION: Ascites and diffuse peritoneal carcinomatosis, including a 2 cm soft tissue mass in the umbilicus. 1.3 cm low-attenuation lesion in right hepatic lobe, suspicious for hepatic metastasis. 6 mm indeterminate left lower lobe pulmonary nodule. Small hiatal hernia. Colonic diverticulosis, without radiographic evidence of diverticulitis. These results will be called to the ordering clinician or representative by the Radiologist Assistant, and communication documented in the PACS or [employer]. Electronically Signed   By: Norleen DELENA Kil M.D.   On: 12/29/2023 14:21      01/21/2024 Pathology Results   SURGICAL PATHOLOGY  CASE: [id number]  PATIENT: Lindsay Marcon  Surgical Pathology Report   Clinical History: Peritoneal carcinomatosis (nt)   FINAL MICROSCOPIC DIAGNOSIS:   A. SUPERFICIAL PERIUMBILICAL NODULE, NEEDLE CORE BIOPSY:  Metastatic high-grade serous carcinoma   Sections show multiple cores of a tumor exhibiting solid and irregular cribriform sheets of atypical cells with variably enlarged round to oval hyperchromatic nuclei.  Focally the tumor appears to show a papillary and hobnail architecture and there are scattered cells with cytoplasmic mucin vacuoles.  Additionally, scattered atypical mitotic figures and multinucleate cells are also evident. Eight immunohistochemical stains were performed with adequate control.  The tumor is diffusely and strongly positive for cytokeratin 7, p16 and the GYN marker PAX8.  The tumor is also positive for estrogen receptor.  The neoplastic cells are negative for the GI markers cytokeratin 20 and CDX2.  The tumor is diffusely positive for p53 in mutant type pattern.  The cytohistomorphology and immunohistochemical pattern support the above diagnosis.    01/23/2024  Initial Diagnosis   Ovarian cancer (HCC)   01/24/2024 Cancer Staging   Staging form: Ovary, Fallopian Tube, and Primary Peritoneal Carcinoma, AJCC 8th Edition - Clinical stage from 01/24/2024: FIGO Stage IVA (cT3c, cN1, pM1a) - Signed by Lonn Hicks, MD on 01/24/2024 Stage prefix: Initial diagnosis   01/30/2024 -  Chemotherapy   Patient is on Treatment Plan :  OVARIAN Carboplatin  (AUC 6) + Paclitaxel  (175) q21d X 6 Cycles     02/02/2024 Tumor Marker   Patient's tumor was tested for the following markers: CA-125. Results of the tumor marker test revealed 1291.     FAMILY HISTORY:  We obtained a detailed, 4-generation family history.  Significant diagnoses are listed below: Family History  Problem Relation Age of Onset   Hypertension Mother    Hypertension Sister 55   Peripheral Artery Disease Sister    Hypertension Maternal Grandfather    Colon cancer Neg Hx    Breast cancer Neg Hx    BRCA 1/2 Neg Hx    Ovarian cancer Neg Hx    Endometrial cancer Neg Hx     Ms. Aller is unaware of previous family history of genetic testing for hereditary cancer risks. There is no reported Ashkenazi Jewish ancestry. She does not report any family history of cancer.      GENETIC TEST RESULTS: Genetic testing reported out on 02/04/24 through the [facility] Multi-Cancer panel found no pathogenic mutations. The [facility] MultiCancers panel includes analysis of the following 70 genes:AIP, ALK, APC, ATM, AXIN2, BAP1, BARD1, BLM, BMPR1A, BRCA1, BRCA2, BRIP1, CDC73, CDH1, CDK4, CDKN1B, CDKN2A, CHEK2, CTNNA1, DICER1, EGFR, EPCAM, FH, FLCN, GREM1, HOXB13, KIT, LZTR1, MAX, MBD4, MEN1, MET, MITF, MLH1, MSH2, MSH6, MUTYH, NF1, NF2, NTHL1, PALB2, PDGFRA, PMS2, POLD1, POLE, POT1, PRKAR1A, PTCH1, PTEN, RAD51C, RAD51D, RB1, RET, SDHA, SDHAF2, SDHB, SDHC, SDHD, SMAD4, SMARCA4, SMARCB1, SMARCE1, STK11, SUFU, TMEM127, TP53, TSC1, TSC2, VHL. The test report has been scanned into EPIC and is located under the Molecular Pathology section  of the Results Review tab.  A portion of the result report is included below for reference.     As current genetic testing is not perfect, it is possible there may be a gene mutation in one of these genes that current testing cannot detect, but that chance is small.  We also discussed, that there could be another gene that has not yet been discovered, or that we have not yet tested, that is responsible for the cancer diagnoses in the family. It is also possible there is a hereditary cause for the cancer in the family that Ms. Mittelstadt did not inherit and therefore was not identified in her testing.  Therefore, it is important to remain in touch with cancer genetics in the future so that we can continue to offer Ms. Ponce the most up to date genetic testing.   Genetic testing did identify a variant of uncertain significance (VUS) in the MITF gene called c.490C>A (p.Pro164Thr).  At this time, it is unknown if this variant is associated with increased cancer risk or if this is a normal finding, but most variants such as this get reclassified to being inconsequential. It should not be used to make medical management decisions. With time, we suspect the lab will determine the significance of this variant, if any. If we do learn more about it, we will try to contact Ms. Springston to discuss it further. However, it is important to stay in touch with us  periodically and keep the address and phone number up to date.  ADDITIONAL GENETIC TESTING: Ms. Botkin genetic testing was fairly extensive.  If there are genes identified to increase cancer risk that can be analyzed in the future, we would be happy to discuss and coordinate this testing at that time.    CANCER SCREENING RECOMMENDATIONS: Ms. Zanders test result is considered negative (normal).  This means that we have not  identified a hereditary cause for her personal and family history of cancer at this time. Most cancers happen by chance and this negative test suggests  that her personal and family history of cancer may fall into this category.    Possible reasons for Ms. Moseley's negative genetic test include:  1. There may be a gene mutation in one of these genes that current testing methods cannot detect. The likelihood of this is low, but possible.   2. There could be another gene that has not yet been discovered, or that we have not yet tested, that is responsible for the cancer diagnoses in the family.  3.  There may be no hereditary risk for cancer in the family. The cancers in Ms. Buescher and/or her family may be sporadic/familial or due to other genetic and environmental factors. 4. It is also possible there is a hereditary cause for the cancer in the family that Ms. Armon did not inherit.  Therefore, it is recommended she continue to follow the cancer management and screening guidelines provided by her oncology and primary healthcare provider. An individual's cancer risk and medical management are not determined by genetic test results alone. Overall cancer risk assessment incorporates additional factors, including personal medical history, family history, and any available genetic information that may result in a personalized plan for cancer prevention and surveillance  Given Ms. Weisgerber's personal and family histories, we must interpret these negative results with some caution.  Families with features suggestive of hereditary risk for cancer tend to have multiple family members with cancer, diagnoses in multiple generations and diagnoses before the age of 87. Ms. Felan family exhibits some of these features. Thus, this result may simply reflect our current inability to detect all mutations within these genes or there may be a different gene that has not yet been discovered or tested.   RECOMMENDATIONS FOR FAMILY MEMBERS:  Individuals in this family might be at some increased risk of developing cancer, over the general population risk, simply due to the family  history of cancer.  We recommended women in this family have a yearly mammogram beginning at age 2, or 79 years younger than the earliest onset of cancer, an annual clinical breast exam, and perform monthly breast self-exams. Women in this family should also have a gynecological exam as recommended by their primary provider. All family members should be referred for colonoscopy starting at age 29, or 20 years younger than the earliest onset of cancer.  Other relatives may benefit from completing their own genetic testing, especially if they have been diagnosed with cancer.   FOLLOW-UP: Cancer genetics is a rapidly advancing field and it is possible that new genetic tests will be appropriate for Ms. Das and/or her family members in the future. We encouraged her to remain in contact with cancer genetics on an annual basis so we can update her personal and family histories and let her know of advances in cancer genetics that may benefit this family.   Our contact number was provided. Ms. Verdell questions were answered to her satisfaction, and she knows she is welcome to call us  at anytime with additional questions or concerns.   Burnard Ogren, MS, [facility] Licensed, [email or web address] [phone]

## 2024-02-06 NOTE — Telephone Encounter (Signed)
 Returned her call. She is concerned about the Bp going up. She takes bp daily but only has the below readings. 1/10 am bp 153/85,  1/15 am bp 145/87, Today bp 137/93 She is asking if she should resume losartan ? She was told to stop due to low bp.

## 2024-02-06 NOTE — Telephone Encounter (Signed)
 Called back and told to resume bp medication. She verbalized understanding.

## 2024-02-06 NOTE — Telephone Encounter (Signed)
 She can restart

## 2024-02-12 ENCOUNTER — Ambulatory Visit: Admitting: Radiation Oncology

## 2024-02-19 MED FILL — Fosaprepitant Dimeglumine For IV Infusion 150 MG (Base Eq): INTRAVENOUS | Qty: 5 | Status: AC

## 2024-02-20 ENCOUNTER — Inpatient Hospital Stay

## 2024-02-20 ENCOUNTER — Inpatient Hospital Stay: Admitting: Hematology and Oncology

## 2024-02-20 ENCOUNTER — Other Ambulatory Visit: Payer: Self-pay | Admitting: Hematology and Oncology

## 2024-02-20 VITALS — BP 155/72 | HR 84 | Temp 97.9°F | Resp 16 | Wt 148.2 lb

## 2024-02-20 DIAGNOSIS — R634 Abnormal weight loss: Secondary | ICD-10-CM | POA: Insufficient documentation

## 2024-02-20 DIAGNOSIS — C569 Malignant neoplasm of unspecified ovary: Secondary | ICD-10-CM

## 2024-02-20 DIAGNOSIS — Z5111 Encounter for antineoplastic chemotherapy: Secondary | ICD-10-CM | POA: Diagnosis not present

## 2024-02-20 LAB — CBC WITH DIFFERENTIAL (CANCER CENTER ONLY)
Abs Immature Granulocytes: 0.06 10*3/uL (ref 0.00–0.07)
Basophils Absolute: 0 10*3/uL (ref 0.0–0.1)
Basophils Relative: 0 %
Eosinophils Absolute: 0 10*3/uL (ref 0.0–0.5)
Eosinophils Relative: 0 %
HCT: 35.9 % — ABNORMAL LOW (ref 36.0–46.0)
Hemoglobin: 12.4 g/dL (ref 12.0–15.0)
Immature Granulocytes: 1 %
Lymphocytes Relative: 8 %
Lymphs Abs: 0.6 10*3/uL — ABNORMAL LOW (ref 0.7–4.0)
MCH: 29.5 pg (ref 26.0–34.0)
MCHC: 34.5 g/dL (ref 30.0–36.0)
MCV: 85.3 fL (ref 80.0–100.0)
Monocytes Absolute: 0.3 10*3/uL (ref 0.1–1.0)
Monocytes Relative: 3 %
Neutro Abs: 7 10*3/uL (ref 1.7–7.7)
Neutrophils Relative %: 88 %
Platelet Count: 281 10*3/uL (ref 150–400)
RBC: 4.21 MIL/uL (ref 3.87–5.11)
RDW: 14.4 % (ref 11.5–15.5)
WBC Count: 8 10*3/uL (ref 4.0–10.5)
nRBC: 0 % (ref 0.0–0.2)

## 2024-02-20 LAB — CMP (CANCER CENTER ONLY)
ALT: 24 U/L (ref 0–44)
AST: 30 U/L (ref 15–41)
Albumin: 4.5 g/dL (ref 3.5–5.0)
Alkaline Phosphatase: 125 U/L (ref 38–126)
Anion gap: 16 — ABNORMAL HIGH (ref 5–15)
BUN: 21 mg/dL (ref 8–23)
CO2: 23 mmol/L (ref 22–32)
Calcium: 10 mg/dL (ref 8.9–10.3)
Chloride: 97 mmol/L — ABNORMAL LOW (ref 98–111)
Creatinine: 0.97 mg/dL (ref 0.44–1.00)
GFR, Estimated: 57 mL/min — ABNORMAL LOW
Glucose, Bld: 165 mg/dL — ABNORMAL HIGH (ref 70–99)
Potassium: 4.1 mmol/L (ref 3.5–5.1)
Sodium: 136 mmol/L (ref 135–145)
Total Bilirubin: 0.3 mg/dL (ref 0.0–1.2)
Total Protein: 7.7 g/dL (ref 6.5–8.1)

## 2024-02-20 MED ORDER — SODIUM CHLORIDE 0.9 % IV SOLN
150.0000 mg | Freq: Once | INTRAVENOUS | Status: AC
  Administered 2024-02-20: 150 mg via INTRAVENOUS
  Filled 2024-02-20: qty 150

## 2024-02-20 MED ORDER — DIPHENHYDRAMINE HCL 50 MG/ML IJ SOLN
12.5000 mg | Freq: Once | INTRAMUSCULAR | Status: AC
  Administered 2024-02-20: 12.5 mg via INTRAVENOUS
  Filled 2024-02-20: qty 1

## 2024-02-20 MED ORDER — FAMOTIDINE IN NACL 20-0.9 MG/50ML-% IV SOLN
20.0000 mg | Freq: Once | INTRAVENOUS | Status: AC
  Administered 2024-02-20: 20 mg via INTRAVENOUS
  Filled 2024-02-20: qty 50

## 2024-02-20 MED ORDER — DEXAMETHASONE SOD PHOSPHATE PF 10 MG/ML IJ SOLN
10.0000 mg | Freq: Once | INTRAMUSCULAR | Status: AC
  Administered 2024-02-20: 10 mg via INTRAVENOUS
  Filled 2024-02-20: qty 1

## 2024-02-20 MED ORDER — SODIUM CHLORIDE 0.9 % IV SOLN
175.0000 mg/m2 | Freq: Once | INTRAVENOUS | Status: AC
  Administered 2024-02-20: 300 mg via INTRAVENOUS
  Filled 2024-02-20: qty 50

## 2024-02-20 MED ORDER — PALONOSETRON HCL INJECTION 0.25 MG/5ML
0.2500 mg | Freq: Once | INTRAVENOUS | Status: AC
  Administered 2024-02-20: 0.25 mg via INTRAVENOUS
  Filled 2024-02-20: qty 5

## 2024-02-20 MED ORDER — SODIUM CHLORIDE 0.9 % IV SOLN
392.4000 mg | Freq: Once | INTRAVENOUS | Status: AC
  Administered 2024-02-20: 390 mg via INTRAVENOUS
  Filled 2024-02-20: qty 39

## 2024-02-20 MED ORDER — SODIUM CHLORIDE 0.9 % IV SOLN: INTRAVENOUS | Status: DC

## 2024-02-20 NOTE — Patient Instructions (Signed)
 [facility] - A DEPT OF [facility]  Discharge Instructions: Thank you for choosing [facility] to provide your oncology and hematology care.   If you have a lab appointment with the Cancer Center, please go directly to the Cancer Center and check in at the registration area.   Wear comfortable clothing and clothing appropriate for easy access to any Portacath or PICC line.   We strive to give you quality time with your provider. You may need to reschedule your appointment if you arrive late (15 or more minutes).  Arriving late affects you and other patients whose appointments are after yours.  Also, if you miss three or more appointments without notifying the office, you may be dismissed from the clinic at the provider's discretion.      For prescription refill requests, have your pharmacy contact our office and allow 72 hours for refills to be completed.    Today you received the following chemotherapy and/or immunotherapy agents: Paclitaxel  (Taxol ) & Carboplatin  (Paraplatin )      To help prevent nausea and vomiting after your treatment, we encourage you to take your nausea medication as directed.  BELOW ARE SYMPTOMS THAT SHOULD BE REPORTED IMMEDIATELY: *FEVER GREATER THAN 100.4 F (38 C) OR HIGHER *CHILLS OR SWEATING *NAUSEA AND VOMITING THAT IS NOT CONTROLLED WITH YOUR NAUSEA MEDICATION *UNUSUAL SHORTNESS OF BREATH *UNUSUAL BRUISING OR BLEEDING *URINARY PROBLEMS (pain or burning when urinating, or frequent urination) *BOWEL PROBLEMS (unusual diarrhea, constipation, pain near the anus) TENDERNESS IN MOUTH AND THROAT WITH OR WITHOUT PRESENCE OF ULCERS (sore throat, sores in mouth, or a toothache) UNUSUAL RASH, SWELLING OR PAIN  UNUSUAL VAGINAL DISCHARGE OR ITCHING   Items with * indicate a potential emergency and should be followed up as soon as possible or go to the Emergency Department if any problems should occur.  Please show the  CHEMOTHERAPY ALERT CARD or IMMUNOTHERAPY ALERT CARD at check-in to the Emergency Department and triage nurse.  Should you have questions after your visit or need to cancel or reschedule your appointment, please contact [facility] - A DEPT OF [facility]  Dept: [phone]  and follow the prompts.  Office hours are 8:00 a.m. to 4:30 p.m. Monday - Friday. Please note that voicemails left after 4:00 p.m. may not be returned until the following business day.  We are closed weekends and major holidays. You have access to a nurse at all times for urgent questions. Please call the main number to the clinic Dept: [phone] and follow the prompts.   For any non-urgent questions, you may also contact your provider using MyChart. We now offer e-Visits for anyone 41 and older to request care online for non-urgent symptoms. For details visit [email or web address].   Also download the MyChart app! Go to the app store, search MyChart, open the app, select [facility], and log in with your MyChart username and password.

## 2024-02-20 NOTE — Assessment & Plan Note (Addendum)
 Likely due to reduction in tumor burden She felt less bloated and with resolution of ascites Observe closely

## 2024-02-20 NOTE — Progress Notes (Signed)
 [facility] OFFICE PROGRESS NOTE  Patient Care Team: Tisovec, Charlie ORN, MD as PCP - General (Internal Medicine) [name], MD as Consulting Physician (Hematology and Oncology) Shannon Agent, MD as Consulting Physician (Radiation Oncology) Vanderbilt Ned, MD as Consulting Physician (General Surgery)  Assessment & Plan Malignant neoplasm of ovary, unspecified laterality [facility]) This patient had prior history of breast cancer She now presents with changes in bowel habits, signs and symptoms of abdominal carcinomatosis with abnormal imaging study, worrisome for undiagnosed ovarian cancer Pathology confirmed diagnosis of cancer from GYN primary, high-grade serous carcinoma, the tumor is diffusely and strongly positive for CK7, p16 and GYN marker PAX8, positive for ER receptor, p53 mutated  So far, she tolerated neoadjuvant chemotherapy well, albeit with some recent weight loss which I suspect is due to improvement of cancer burden We will proceed with cycle 2 without delay The dose of chemotherapy was readjusted due to recent weight loss If she has great response to treatment after 3 cycles of neoadjuvant chemotherapy, we can convert her into a surgical candidate I plan to repeat imaging study after 3 cycles of treatment Weight loss Likely due to reduction in tumor burden She felt less bloated and with resolution of ascites Observe closely  Orders Placed This Encounter  Procedures   CT CHEST ABDOMEN PELVIS W CONTRAST    Standing Status:   Future    Expected Date:   03/19/2024    Expiration Date:   02/19/2025    If indicated for the ordered procedure, I authorize the administration of contrast media per Radiology protocol:   Yes    Does the patient have a contrast media/X-ray dye allergy?:   No    Preferred imaging location?:   [facility]    If indicated for the ordered procedure, I authorize the administration of oral contrast media per Radiology protocol:    No    Reason for no oral contrast::   No need oral contrast     Ashley Bedford, MD  INTERVAL HISTORY: she returns for treatment follow-up Complications related to previous cycle of chemotherapy included weight loss,  PHYSICAL EXAMINATION: ECOG PERFORMANCE STATUS: 1 - Symptomatic but completely ambulatory  Lab Results  Component Value Date   CAN125 1,291.0 (H) 01/29/2024   CAN125 1,704.0 (H) 12/30/2023      Latest Ref Rng & Units 02/20/2024    8:18 AM 01/29/2024   10:42 AM 01/07/2024   10:04 AM  CBC  WBC 4.0 - 10.5 K/uL 8.0  10.6  9.4   Hemoglobin 12.0 - 15.0 g/dL 87.5  86.9  86.1   Hematocrit 36.0 - 46.0 % 35.9  36.9  40.3   Platelets 150 - 400 K/uL 281  573  420       Chemistry      Component Value Date/Time   NA 136 02/20/2024 0818   K 4.1 02/20/2024 0818   CL 97 (L) 02/20/2024 0818   CO2 23 02/20/2024 0818   BUN 21 02/20/2024 0818   CREATININE 0.97 02/20/2024 0818   CREATININE 1.05 (H) 04/30/2023 1018   CREATININE 1.10 (H) 04/30/2023 1018      Component Value Date/Time   CALCIUM  10.0 02/20/2024 0818   ALKPHOS 125 02/20/2024 0818   AST 30 02/20/2024 0818   ALT 24 02/20/2024 0818   BILITOT 0.3 02/20/2024 0818       There were no vitals filed for this visit. There were no vitals filed for this visit. Other relevant data reviewed  during this visit included CBC, CMP, CA125

## 2024-03-12 ENCOUNTER — Inpatient Hospital Stay

## 2024-03-12 ENCOUNTER — Inpatient Hospital Stay: Admitting: Hematology and Oncology

## 2024-03-19 ENCOUNTER — Ambulatory Visit (HOSPITAL_BASED_OUTPATIENT_CLINIC_OR_DEPARTMENT_OTHER)

## 2024-03-22 ENCOUNTER — Inpatient Hospital Stay: Admitting: Psychiatry
